# Patient Record
Sex: Female | Born: 1980 | Race: Black or African American | Hispanic: No | Marital: Single | Smoking: Current every day smoker
Health system: Southern US, Community
[De-identification: ages and names within clinical notes are randomized; demographics above are authoritative.]

## PROBLEM LIST (undated history)

## (undated) ENCOUNTER — Inpatient Hospital Stay (HOSPITAL_COMMUNITY): Payer: Self-pay

## (undated) DIAGNOSIS — D649 Anemia, unspecified: Secondary | ICD-10-CM

## (undated) DIAGNOSIS — U071 COVID-19: Secondary | ICD-10-CM

## (undated) DIAGNOSIS — A749 Chlamydial infection, unspecified: Secondary | ICD-10-CM

## (undated) DIAGNOSIS — G43909 Migraine, unspecified, not intractable, without status migrainosus: Secondary | ICD-10-CM

## (undated) DIAGNOSIS — I10 Essential (primary) hypertension: Secondary | ICD-10-CM

## (undated) DIAGNOSIS — F99 Mental disorder, not otherwise specified: Secondary | ICD-10-CM

## (undated) DIAGNOSIS — D219 Benign neoplasm of connective and other soft tissue, unspecified: Secondary | ICD-10-CM

## (undated) DIAGNOSIS — O139 Gestational [pregnancy-induced] hypertension without significant proteinuria, unspecified trimester: Secondary | ICD-10-CM

## (undated) DIAGNOSIS — A599 Trichomoniasis, unspecified: Secondary | ICD-10-CM

## (undated) DIAGNOSIS — A549 Gonococcal infection, unspecified: Secondary | ICD-10-CM

## (undated) DIAGNOSIS — O009 Unspecified ectopic pregnancy without intrauterine pregnancy: Secondary | ICD-10-CM

## (undated) HISTORY — PX: DILATION AND CURETTAGE OF UTERUS: SHX78

## (undated) HISTORY — PX: ECTOPIC PREGNANCY SURGERY: SHX613

---

## 2000-07-29 ENCOUNTER — Emergency Department (HOSPITAL_COMMUNITY): Admission: EM | Admit: 2000-07-29 | Discharge: 2000-07-29 | Payer: Self-pay | Admitting: Emergency Medicine

## 2001-04-19 ENCOUNTER — Emergency Department (HOSPITAL_COMMUNITY): Admission: EM | Admit: 2001-04-19 | Discharge: 2001-04-19 | Payer: Self-pay | Admitting: Emergency Medicine

## 2001-04-19 ENCOUNTER — Encounter: Payer: Self-pay | Admitting: Emergency Medicine

## 2002-12-21 ENCOUNTER — Inpatient Hospital Stay (HOSPITAL_COMMUNITY): Admission: AD | Admit: 2002-12-21 | Discharge: 2002-12-21 | Payer: Self-pay | Admitting: *Deleted

## 2003-12-08 ENCOUNTER — Inpatient Hospital Stay (HOSPITAL_COMMUNITY): Admission: AD | Admit: 2003-12-08 | Discharge: 2003-12-08 | Payer: Self-pay | Admitting: *Deleted

## 2004-04-08 ENCOUNTER — Inpatient Hospital Stay (HOSPITAL_COMMUNITY): Admission: AD | Admit: 2004-04-08 | Discharge: 2004-04-27 | Payer: Self-pay | Admitting: Obstetrics

## 2004-05-18 ENCOUNTER — Encounter: Admission: RE | Admit: 2004-05-18 | Discharge: 2004-06-17 | Payer: Self-pay | Admitting: Obstetrics

## 2004-07-18 ENCOUNTER — Encounter: Admission: RE | Admit: 2004-07-18 | Discharge: 2004-08-17 | Payer: Self-pay | Admitting: Obstetrics

## 2004-09-17 ENCOUNTER — Encounter: Admission: RE | Admit: 2004-09-17 | Discharge: 2004-10-17 | Payer: Self-pay | Admitting: Obstetrics

## 2004-10-18 ENCOUNTER — Encounter: Admission: RE | Admit: 2004-10-18 | Discharge: 2004-11-17 | Payer: Self-pay | Admitting: Obstetrics

## 2005-01-17 ENCOUNTER — Emergency Department (HOSPITAL_COMMUNITY): Admission: EM | Admit: 2005-01-17 | Discharge: 2005-01-17 | Payer: Self-pay | Admitting: Emergency Medicine

## 2006-06-02 ENCOUNTER — Emergency Department (HOSPITAL_COMMUNITY): Admission: EM | Admit: 2006-06-02 | Discharge: 2006-06-02 | Payer: Self-pay | Admitting: Family Medicine

## 2007-01-15 ENCOUNTER — Emergency Department (HOSPITAL_COMMUNITY): Admission: EM | Admit: 2007-01-15 | Discharge: 2007-01-15 | Payer: Self-pay | Admitting: Emergency Medicine

## 2007-04-14 ENCOUNTER — Emergency Department (HOSPITAL_COMMUNITY): Admission: EM | Admit: 2007-04-14 | Discharge: 2007-04-14 | Payer: Self-pay | Admitting: Family Medicine

## 2007-08-26 ENCOUNTER — Emergency Department (HOSPITAL_COMMUNITY): Admission: EM | Admit: 2007-08-26 | Discharge: 2007-08-26 | Payer: Self-pay | Admitting: Emergency Medicine

## 2007-09-18 ENCOUNTER — Inpatient Hospital Stay (HOSPITAL_COMMUNITY): Admission: AD | Admit: 2007-09-18 | Discharge: 2007-09-18 | Payer: Self-pay | Admitting: Obstetrics & Gynecology

## 2008-04-18 ENCOUNTER — Emergency Department (HOSPITAL_COMMUNITY): Admission: EM | Admit: 2008-04-18 | Discharge: 2008-04-19 | Payer: Self-pay | Admitting: Emergency Medicine

## 2008-07-16 ENCOUNTER — Ambulatory Visit (HOSPITAL_COMMUNITY): Admission: AD | Admit: 2008-07-16 | Discharge: 2008-07-16 | Payer: Self-pay | Admitting: Obstetrics & Gynecology

## 2008-07-16 ENCOUNTER — Ambulatory Visit: Payer: Self-pay | Admitting: Family Medicine

## 2008-07-16 ENCOUNTER — Encounter: Payer: Self-pay | Admitting: Family Medicine

## 2008-07-17 ENCOUNTER — Inpatient Hospital Stay (HOSPITAL_COMMUNITY): Admission: AD | Admit: 2008-07-17 | Discharge: 2008-07-19 | Payer: Self-pay | Admitting: Obstetrics & Gynecology

## 2008-07-17 ENCOUNTER — Ambulatory Visit: Payer: Self-pay | Admitting: Obstetrics and Gynecology

## 2008-07-30 ENCOUNTER — Ambulatory Visit: Payer: Self-pay | Admitting: Obstetrics and Gynecology

## 2008-12-25 ENCOUNTER — Emergency Department (HOSPITAL_COMMUNITY): Admission: EM | Admit: 2008-12-25 | Discharge: 2008-12-25 | Payer: Self-pay | Admitting: Emergency Medicine

## 2008-12-26 ENCOUNTER — Encounter: Admission: RE | Admit: 2008-12-26 | Discharge: 2008-12-26 | Payer: Self-pay | Admitting: Chiropractic Medicine

## 2009-03-25 ENCOUNTER — Ambulatory Visit: Payer: Self-pay | Admitting: Obstetrics and Gynecology

## 2009-03-25 ENCOUNTER — Inpatient Hospital Stay (HOSPITAL_COMMUNITY): Admission: AD | Admit: 2009-03-25 | Discharge: 2009-03-25 | Payer: Self-pay | Admitting: Obstetrics & Gynecology

## 2009-03-28 ENCOUNTER — Inpatient Hospital Stay (HOSPITAL_COMMUNITY): Admission: AD | Admit: 2009-03-28 | Discharge: 2009-03-28 | Payer: Self-pay | Admitting: Obstetrics and Gynecology

## 2009-04-01 ENCOUNTER — Inpatient Hospital Stay (HOSPITAL_COMMUNITY): Admission: AD | Admit: 2009-04-01 | Discharge: 2009-04-01 | Payer: Self-pay | Admitting: Obstetrics & Gynecology

## 2009-04-03 ENCOUNTER — Inpatient Hospital Stay (HOSPITAL_COMMUNITY): Admission: AD | Admit: 2009-04-03 | Discharge: 2009-04-03 | Payer: Self-pay | Admitting: Obstetrics & Gynecology

## 2009-04-09 ENCOUNTER — Inpatient Hospital Stay (HOSPITAL_COMMUNITY): Admission: RE | Admit: 2009-04-09 | Discharge: 2009-04-09 | Payer: Self-pay | Admitting: Obstetrics & Gynecology

## 2009-05-21 ENCOUNTER — Inpatient Hospital Stay (HOSPITAL_COMMUNITY): Admission: AD | Admit: 2009-05-21 | Discharge: 2009-05-22 | Payer: Self-pay | Admitting: Family Medicine

## 2009-06-11 ENCOUNTER — Ambulatory Visit: Payer: Self-pay | Admitting: Obstetrics & Gynecology

## 2009-06-15 ENCOUNTER — Ambulatory Visit: Payer: Self-pay | Admitting: Obstetrics & Gynecology

## 2009-06-15 LAB — CONVERTED CEMR LAB
Albumin: 3.5 g/dL (ref 3.5–5.2)
CO2: 20 meq/L (ref 19–32)
Calcium: 8.7 mg/dL (ref 8.4–10.5)
Chloride: 107 meq/L (ref 96–112)
Creatinine Clearance: 119 mL/min — ABNORMAL HIGH (ref 75–115)
Glucose, Bld: 75 mg/dL (ref 70–99)
Potassium: 3.7 meq/L (ref 3.5–5.3)
RBC: 3.35 M/uL — ABNORMAL LOW (ref 3.87–5.11)
Sodium: 138 meq/L (ref 135–145)
TSH: 1.015 microintl units/mL (ref 0.350–4.500)
Total Protein: 6 g/dL (ref 6.0–8.3)
Uric Acid, Serum: 3.9 mg/dL (ref 2.4–7.0)
WBC: 7.1 10*3/uL (ref 4.0–10.5)

## 2009-06-18 ENCOUNTER — Ambulatory Visit (HOSPITAL_COMMUNITY): Admission: RE | Admit: 2009-06-18 | Discharge: 2009-06-18 | Payer: Self-pay | Admitting: Obstetrics & Gynecology

## 2009-06-18 ENCOUNTER — Ambulatory Visit: Payer: Self-pay | Admitting: Obstetrics & Gynecology

## 2009-06-19 ENCOUNTER — Ambulatory Visit (HOSPITAL_COMMUNITY): Admission: RE | Admit: 2009-06-19 | Discharge: 2009-06-19 | Payer: Self-pay | Admitting: Obstetrics & Gynecology

## 2009-06-19 ENCOUNTER — Ambulatory Visit: Payer: Self-pay | Admitting: Obstetrics & Gynecology

## 2009-07-02 ENCOUNTER — Ambulatory Visit: Payer: Self-pay | Admitting: Obstetrics & Gynecology

## 2009-07-09 ENCOUNTER — Ambulatory Visit: Payer: Self-pay | Admitting: Obstetrics & Gynecology

## 2009-07-13 ENCOUNTER — Encounter: Payer: Self-pay | Admitting: Family Medicine

## 2009-07-13 ENCOUNTER — Inpatient Hospital Stay (HOSPITAL_COMMUNITY): Admission: AD | Admit: 2009-07-13 | Discharge: 2009-07-13 | Payer: Self-pay | Admitting: Obstetrics & Gynecology

## 2009-07-16 ENCOUNTER — Ambulatory Visit (HOSPITAL_COMMUNITY): Admission: RE | Admit: 2009-07-16 | Discharge: 2009-07-16 | Payer: Self-pay | Admitting: Obstetrics & Gynecology

## 2009-07-16 ENCOUNTER — Ambulatory Visit: Payer: Self-pay | Admitting: Obstetrics & Gynecology

## 2009-07-23 ENCOUNTER — Ambulatory Visit: Payer: Self-pay | Admitting: Obstetrics & Gynecology

## 2009-07-23 ENCOUNTER — Encounter: Payer: Self-pay | Admitting: Obstetrics & Gynecology

## 2009-07-30 ENCOUNTER — Ambulatory Visit: Payer: Self-pay | Admitting: Obstetrics & Gynecology

## 2009-08-02 ENCOUNTER — Inpatient Hospital Stay (HOSPITAL_COMMUNITY): Admission: AD | Admit: 2009-08-02 | Discharge: 2009-08-02 | Payer: Self-pay | Admitting: Obstetrics and Gynecology

## 2009-08-04 ENCOUNTER — Ambulatory Visit: Payer: Self-pay | Admitting: Obstetrics and Gynecology

## 2009-08-13 ENCOUNTER — Ambulatory Visit: Payer: Self-pay | Admitting: Obstetrics & Gynecology

## 2009-08-13 ENCOUNTER — Encounter: Payer: Self-pay | Admitting: Obstetrics & Gynecology

## 2009-08-20 ENCOUNTER — Ambulatory Visit: Payer: Self-pay | Admitting: Obstetrics & Gynecology

## 2009-08-20 ENCOUNTER — Ambulatory Visit (HOSPITAL_COMMUNITY): Admission: RE | Admit: 2009-08-20 | Discharge: 2009-08-20 | Payer: Self-pay | Admitting: Family Medicine

## 2009-08-31 ENCOUNTER — Ambulatory Visit: Payer: Self-pay | Admitting: Obstetrics & Gynecology

## 2009-09-17 ENCOUNTER — Encounter: Payer: Self-pay | Admitting: Obstetrics & Gynecology

## 2009-09-17 ENCOUNTER — Ambulatory Visit: Payer: Self-pay | Admitting: Obstetrics & Gynecology

## 2009-09-17 LAB — CONVERTED CEMR LAB
Albumin: 3.2 g/dL — ABNORMAL LOW (ref 3.5–5.2)
Alkaline Phosphatase: 73 units/L (ref 39–117)
BUN: 6 mg/dL (ref 6–23)
Calcium: 8.2 mg/dL — ABNORMAL LOW (ref 8.4–10.5)
Glucose, Bld: 137 mg/dL — ABNORMAL HIGH (ref 70–99)
Hemoglobin: 11.5 g/dL — ABNORMAL LOW (ref 12.0–15.0)
MCHC: 34.2 g/dL (ref 30.0–36.0)
Potassium: 3.4 meq/L — ABNORMAL LOW (ref 3.5–5.3)
RBC: 3.57 M/uL — ABNORMAL LOW (ref 3.87–5.11)

## 2009-09-24 ENCOUNTER — Ambulatory Visit: Payer: Self-pay | Admitting: Obstetrics & Gynecology

## 2009-09-24 ENCOUNTER — Encounter (INDEPENDENT_AMBULATORY_CARE_PROVIDER_SITE_OTHER): Payer: Self-pay | Admitting: *Deleted

## 2009-10-01 ENCOUNTER — Encounter: Payer: Self-pay | Admitting: Family Medicine

## 2009-10-01 ENCOUNTER — Ambulatory Visit: Payer: Self-pay | Admitting: Obstetrics & Gynecology

## 2009-10-01 ENCOUNTER — Inpatient Hospital Stay (HOSPITAL_COMMUNITY): Admission: AD | Admit: 2009-10-01 | Discharge: 2009-10-01 | Payer: Self-pay | Admitting: Obstetrics & Gynecology

## 2009-10-08 ENCOUNTER — Ambulatory Visit: Payer: Self-pay | Admitting: Obstetrics & Gynecology

## 2009-10-15 ENCOUNTER — Ambulatory Visit: Payer: Self-pay | Admitting: Family Medicine

## 2009-10-15 LAB — CONVERTED CEMR LAB
ALT: 9 units/L (ref 0–35)
AST: 12 units/L (ref 0–37)
Albumin: 3.1 g/dL — ABNORMAL LOW (ref 3.5–5.2)
Calcium: 8.5 mg/dL (ref 8.4–10.5)
Chloride: 107 meq/L (ref 96–112)
Hemoglobin: 11.8 g/dL — ABNORMAL LOW (ref 12.0–15.0)
Platelets: 196 10*3/uL (ref 150–400)
Potassium: 3.6 meq/L (ref 3.5–5.3)
RDW: 12.5 % (ref 11.5–15.5)
Total Protein: 5.7 g/dL — ABNORMAL LOW (ref 6.0–8.3)

## 2009-10-16 ENCOUNTER — Ambulatory Visit: Payer: Self-pay | Admitting: Obstetrics & Gynecology

## 2009-10-19 ENCOUNTER — Ambulatory Visit: Payer: Self-pay | Admitting: Obstetrics & Gynecology

## 2009-10-22 ENCOUNTER — Ambulatory Visit (HOSPITAL_COMMUNITY): Admission: RE | Admit: 2009-10-22 | Discharge: 2009-10-22 | Payer: Self-pay | Admitting: Family Medicine

## 2009-10-22 ENCOUNTER — Ambulatory Visit: Payer: Self-pay | Admitting: Family Medicine

## 2009-10-22 LAB — CONVERTED CEMR LAB
CO2: 22 meq/L (ref 19–32)
Calcium: 8.4 mg/dL (ref 8.4–10.5)
Creatinine, Ser: 0.64 mg/dL (ref 0.40–1.20)
Glucose, Bld: 91 mg/dL (ref 70–99)
HCT: 33.9 % — ABNORMAL LOW (ref 36.0–46.0)
MCV: 92.9 fL (ref 78.0–100.0)
RBC: 3.65 M/uL — ABNORMAL LOW (ref 3.87–5.11)
Total Bilirubin: 0.2 mg/dL — ABNORMAL LOW (ref 0.3–1.2)
Total Protein: 5.5 g/dL — ABNORMAL LOW (ref 6.0–8.3)
WBC: 7.7 10*3/uL (ref 4.0–10.5)

## 2009-10-23 ENCOUNTER — Ambulatory Visit: Payer: Self-pay | Admitting: Obstetrics and Gynecology

## 2009-10-23 ENCOUNTER — Encounter: Payer: Self-pay | Admitting: Family Medicine

## 2009-10-23 LAB — CONVERTED CEMR LAB
Collection Interval-CRCL: 24 hr
Creatinine Clearance: 86 mL/min (ref 75–115)
Creatinine, Urine: 65.7 mg/dL
Protein, Ur: 48 mg/24hr — ABNORMAL LOW (ref 50–100)

## 2009-10-26 ENCOUNTER — Ambulatory Visit: Payer: Self-pay | Admitting: Obstetrics & Gynecology

## 2009-10-29 ENCOUNTER — Inpatient Hospital Stay (HOSPITAL_COMMUNITY): Admission: RE | Admit: 2009-10-29 | Discharge: 2009-11-02 | Payer: Self-pay | Admitting: Obstetrics & Gynecology

## 2009-10-29 ENCOUNTER — Ambulatory Visit: Payer: Self-pay | Admitting: Family Medicine

## 2009-10-31 ENCOUNTER — Encounter: Payer: Self-pay | Admitting: Obstetrics & Gynecology

## 2009-11-04 ENCOUNTER — Emergency Department (HOSPITAL_COMMUNITY): Admission: EM | Admit: 2009-11-04 | Discharge: 2009-11-04 | Payer: Self-pay | Admitting: Emergency Medicine

## 2010-09-28 ENCOUNTER — Emergency Department (HOSPITAL_COMMUNITY)
Admission: EM | Admit: 2010-09-28 | Discharge: 2010-09-28 | Payer: Self-pay | Source: Home / Self Care | Admitting: Emergency Medicine

## 2010-09-29 LAB — DIFFERENTIAL
Basophils Absolute: 0 10*3/uL (ref 0.0–0.1)
Basophils Relative: 0 % (ref 0–1)
Eosinophils Absolute: 0 10*3/uL (ref 0.0–0.7)
Eosinophils Relative: 0 % (ref 0–5)
Lymphocytes Relative: 9 % — ABNORMAL LOW (ref 12–46)
Lymphs Abs: 1.4 10*3/uL (ref 0.7–4.0)
Monocytes Absolute: 0.5 10*3/uL (ref 0.1–1.0)
Monocytes Relative: 4 % (ref 3–12)
Neutro Abs: 13.4 10*3/uL — ABNORMAL HIGH (ref 1.7–7.7)
Neutrophils Relative %: 87 % — ABNORMAL HIGH (ref 43–77)

## 2010-09-29 LAB — URINALYSIS, ROUTINE W REFLEX MICROSCOPIC
Hgb urine dipstick: NEGATIVE
Ketones, ur: >80 mg/dL — AB
Nitrite: NEGATIVE
Protein, ur: NEGATIVE mg/dL
Specific Gravity, Urine: 1.025 (ref 1.005–1.030)
Urine Glucose, Fasting: NEGATIVE mg/dL
Urobilinogen, UA: 1 mg/dL (ref 0.0–1.0)
pH: 5.5 (ref 5.0–8.0)

## 2010-09-29 LAB — CBC
HCT: 38.6 % (ref 36.0–46.0)
Hemoglobin: 13 g/dL (ref 12.0–15.0)
MCH: 31.7 pg (ref 26.0–34.0)
MCHC: 33.7 g/dL (ref 30.0–36.0)
MCV: 94.1 fL (ref 78.0–100.0)
Platelets: 202 10*3/uL (ref 150–400)
RBC: 4.1 MIL/uL (ref 3.87–5.11)
RDW: 12.4 % (ref 11.5–15.5)
WBC: 15.3 10*3/uL — ABNORMAL HIGH (ref 4.0–10.5)

## 2010-09-29 LAB — URINE MICROSCOPIC-ADD ON

## 2010-09-29 LAB — POCT I-STAT, CHEM 8
BUN: 5 mg/dL — ABNORMAL LOW (ref 6–23)
Calcium, Ion: 1.12 mmol/L (ref 1.12–1.32)
Chloride: 102 mEq/L (ref 96–112)
Creatinine, Ser: 1 mg/dL (ref 0.4–1.2)
Glucose, Bld: 104 mg/dL — ABNORMAL HIGH (ref 70–99)
HCT: 40 % (ref 36.0–46.0)
Hemoglobin: 13.6 g/dL (ref 12.0–15.0)
Potassium: 3.2 mEq/L — ABNORMAL LOW (ref 3.5–5.1)
Sodium: 139 mEq/L (ref 135–145)
TCO2: 28 mmol/L (ref 0–100)

## 2010-09-29 LAB — POCT PREGNANCY, URINE: Preg Test, Ur: NEGATIVE

## 2010-09-29 LAB — WET PREP, GENITAL
Trich, Wet Prep: NONE SEEN
Yeast Wet Prep HPF POC: NONE SEEN

## 2010-09-29 LAB — RPR: RPR Ser Ql: NONREACTIVE

## 2010-10-04 LAB — URINE CULTURE: Colony Count: >=100000

## 2010-11-01 IMAGING — US US OB TRANSVAGINAL MODIFY
1 series · 14 of 28 positions shown · non-contrast
Comparison: None.

CLINICAL DATA: Pain.

OBSTETRIC <14 WK US AND TRANSVAGINAL OB US
TECHNIQUE: Both transabdominal and transvaginal ultrasound
examinations were performed for complete evaluation of the
gestation as well as the maternal uterus, adnexal regions, and
pelvic cul-de-sac.

[Series 1: us ob comp less 14 wks · 0.21mm/px · 14 of 49 slices shown]
[im 2/49]
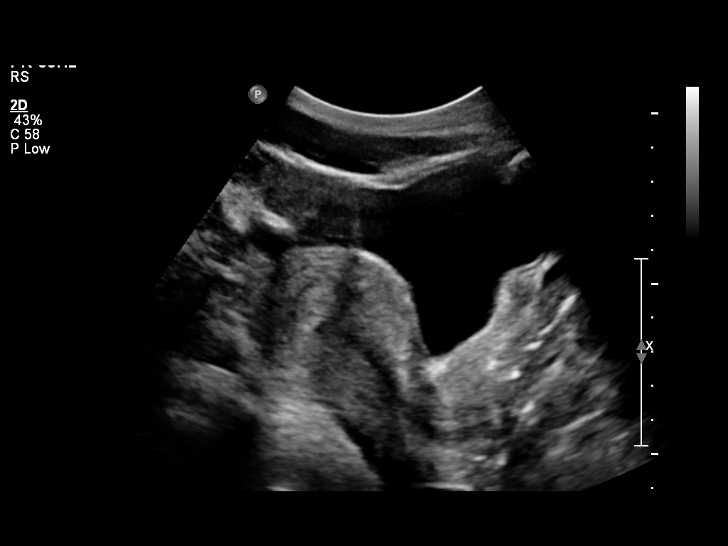
[im 6/49]
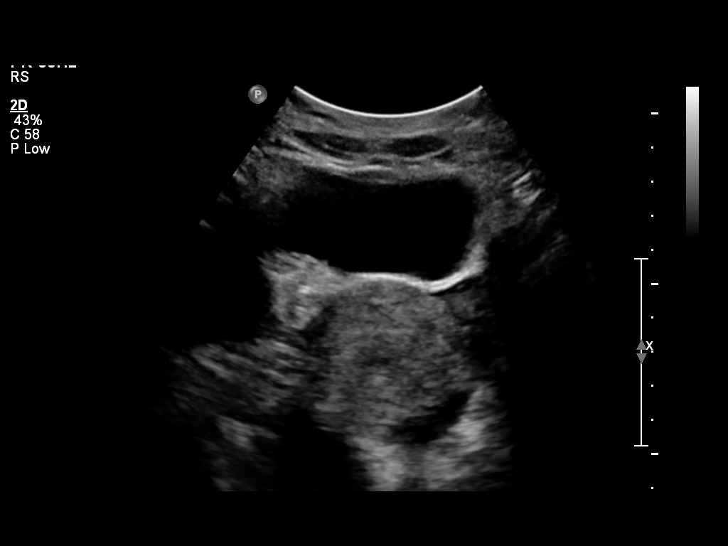
[im 9/49]
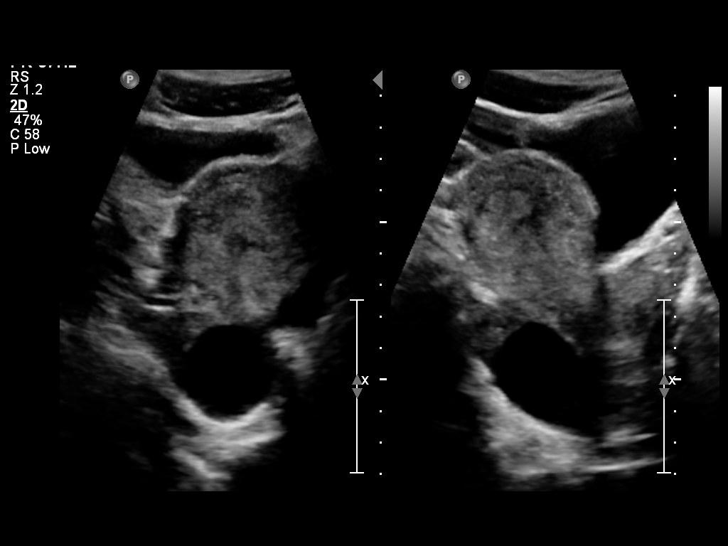
[im 13/49]
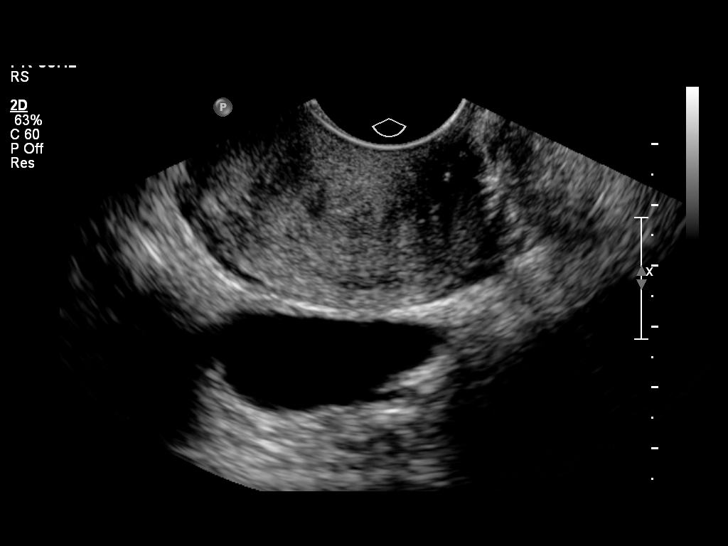
[im 17/49]
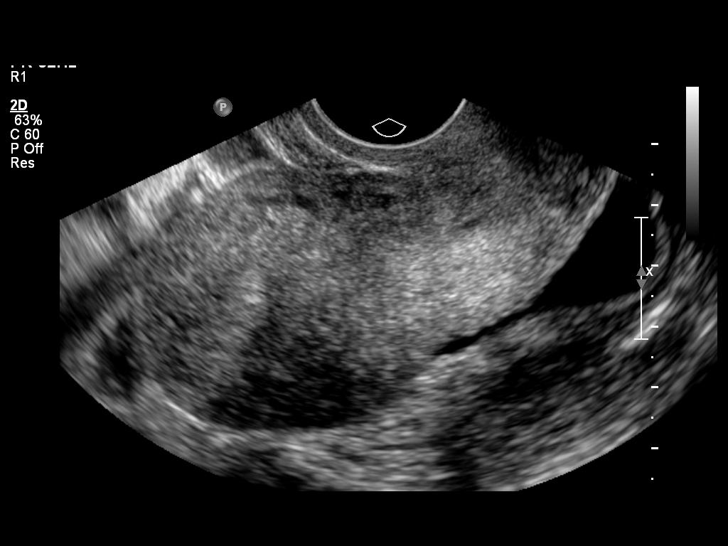
[im 20/49]
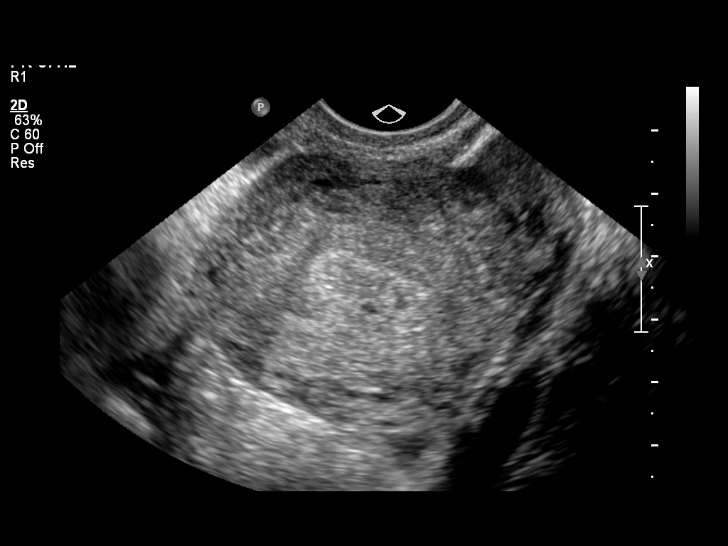
[im 24/49]
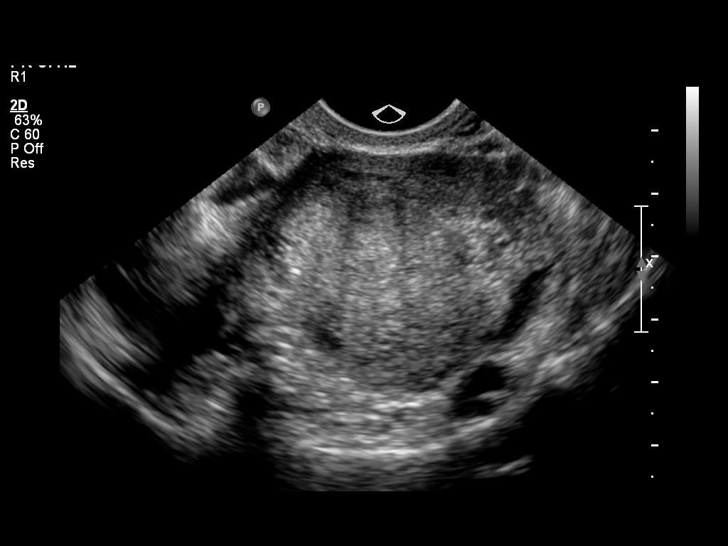
[im 27/49]
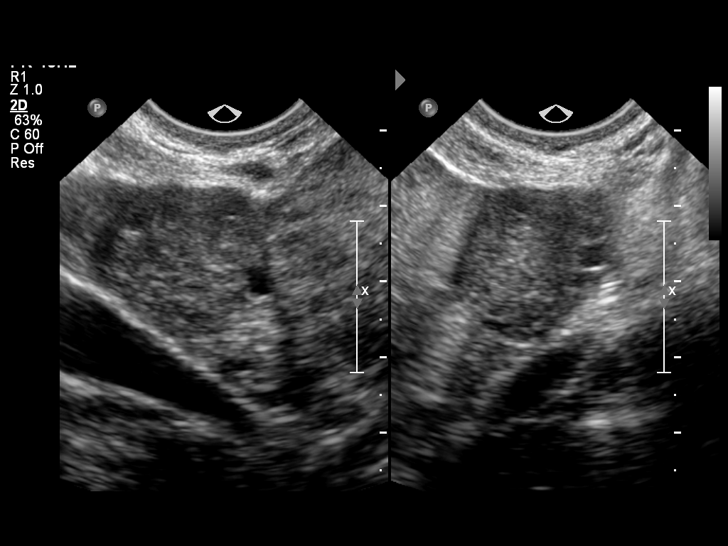
[im 31/49]
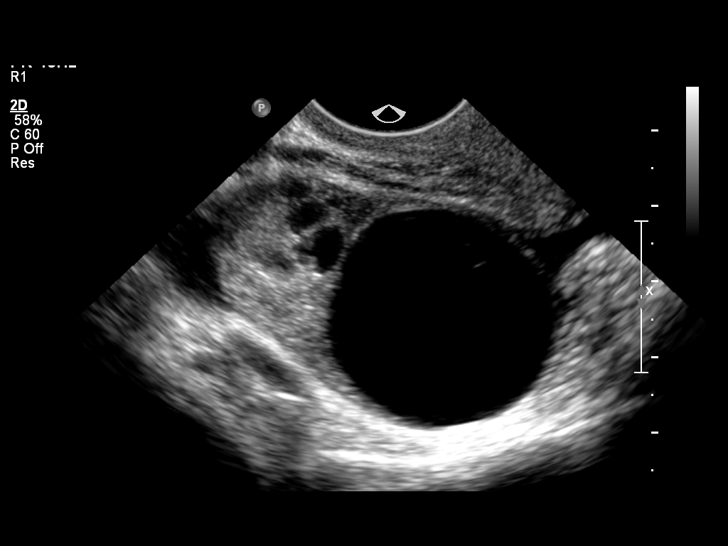
[im 34/49]
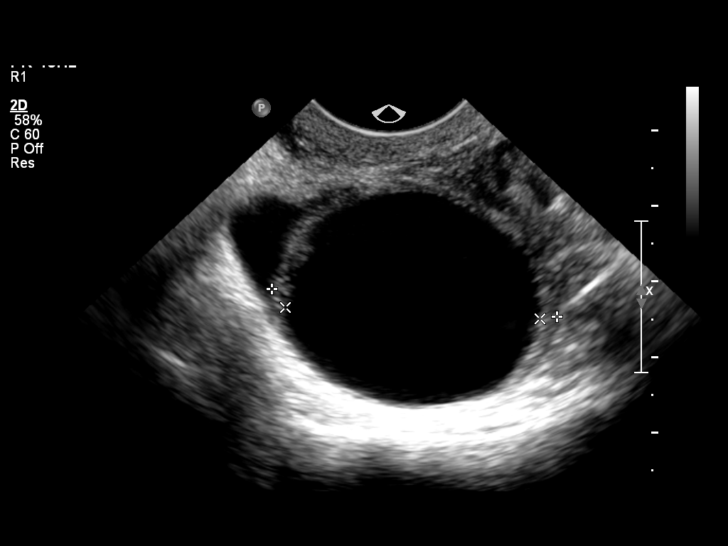
[im 38/49]
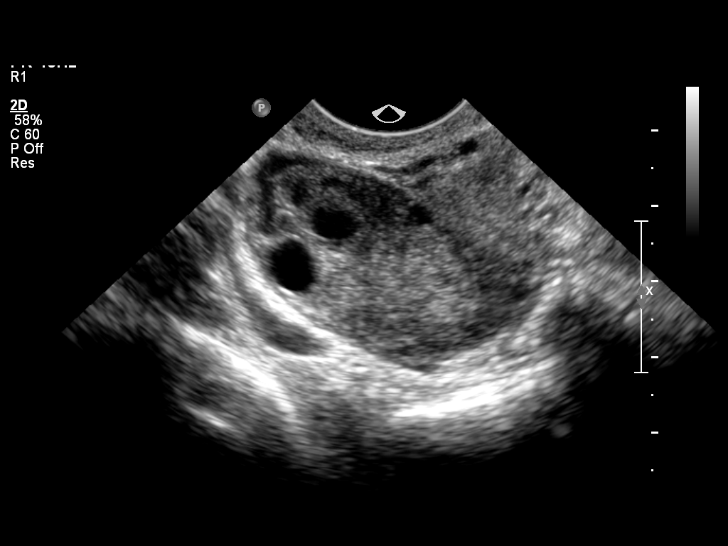
[im 41/49]
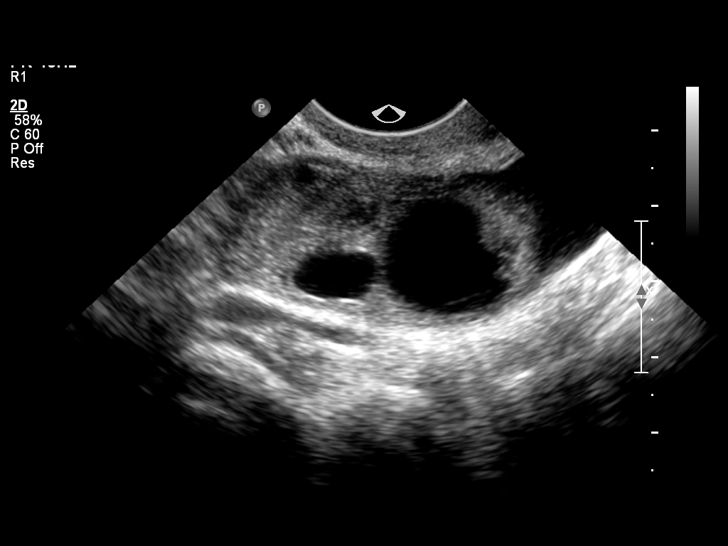
[im 45/49]
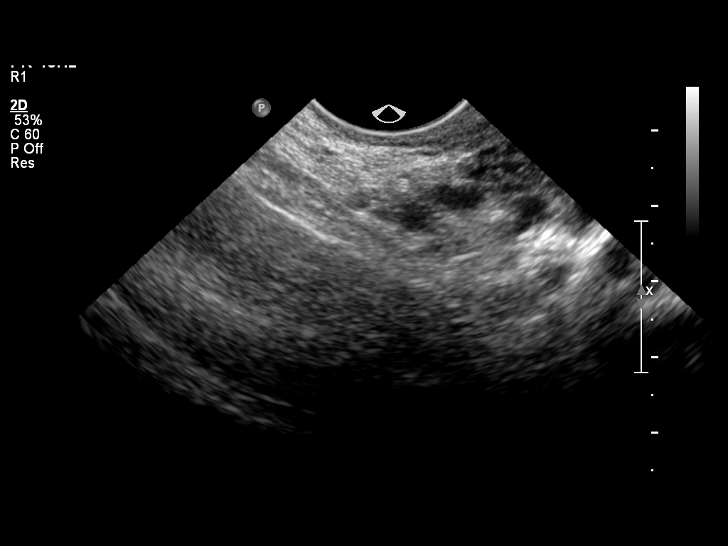
[im 49/49]
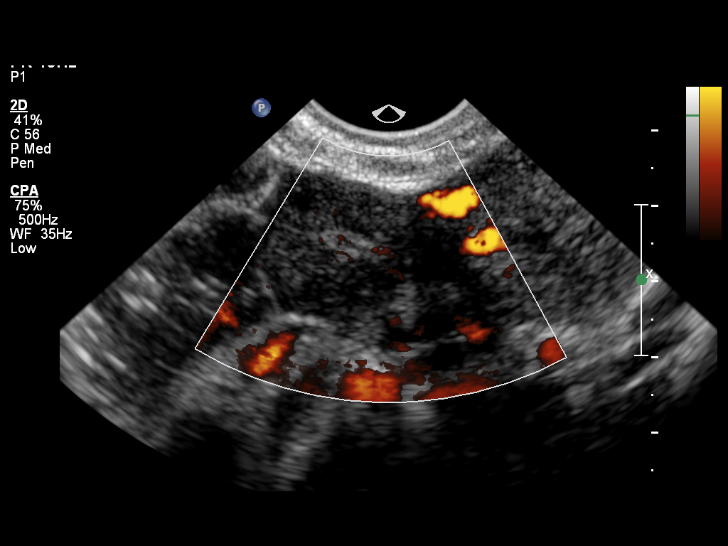

[14 of 28 positions shown; findings below may reference images not displayed]

Within the endometrial canal, there is a cystic structure which may
represent a very early intrauterine gestational sac with a mean sac
diameter of 2.2 mm (which would be consistent with a 4-week-0-day
gestational age).  However, it is difficult to confirm this with
any certainty and therefore correlation with serial beta HCGs and
close follow-up sonogram recommended to help exclude the
possibility of this not representing an early intrauterine
pregnancy and representing result underlying ectopic pregnancy.

Right ovary with dominant 3.8 cm simple appearing cyst.

Small amount of free fluid in the cul-de-sac.
IMPRESSION: Question very early intrauterine gestation as discussed above.
Ectopic pregnancy cannot be completely excluded.  This will require
close clinical correlation, laboratory correlation and follow-up
sonogram.

This is a call report.

## 2010-11-16 IMAGING — US US OB TRANSVAGINAL
1 series · 14 of 28 positions shown · non-contrast
Comparison: none

OBSTETRICAL ULTRASOUND:
 This ultrasound exam was performed in the [HOSPITAL] Ultrasound Department.  The OB US report was generated in the AS system, and faxed to the ordering physician.  This report is also available in [REDACTED] PACS.

[Series 1: us ob transvaginal · 0.06mm/px · 14 of 51 slices shown]
[im 2/51]
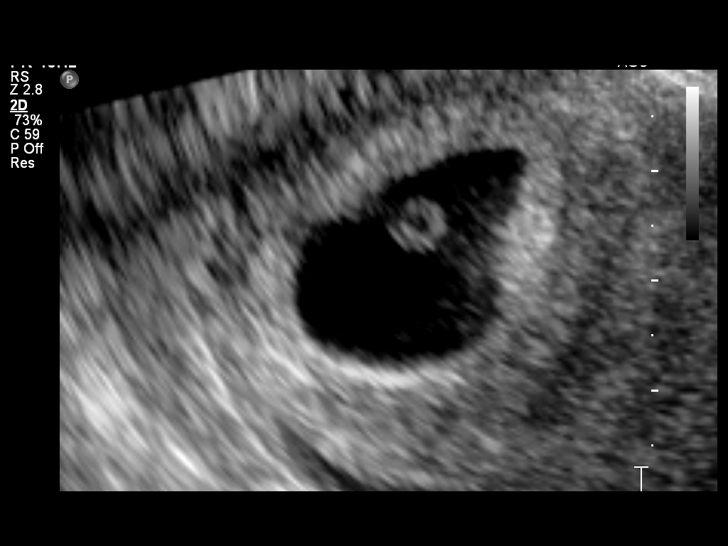
[im 6/51]
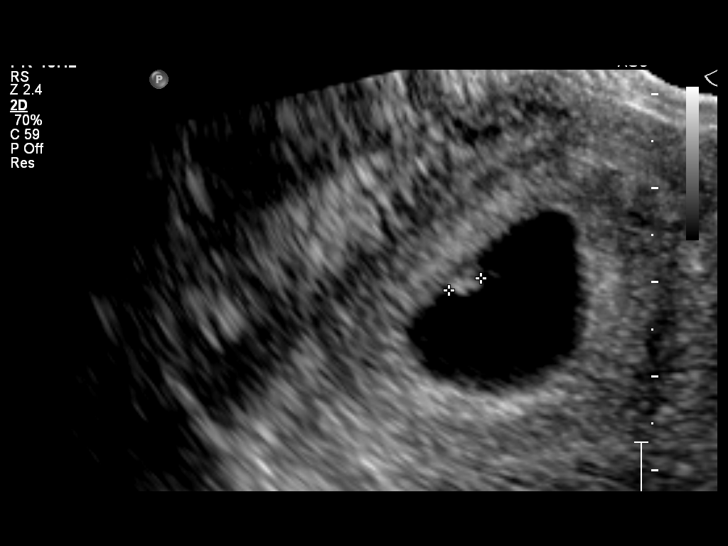
[im 10/51]
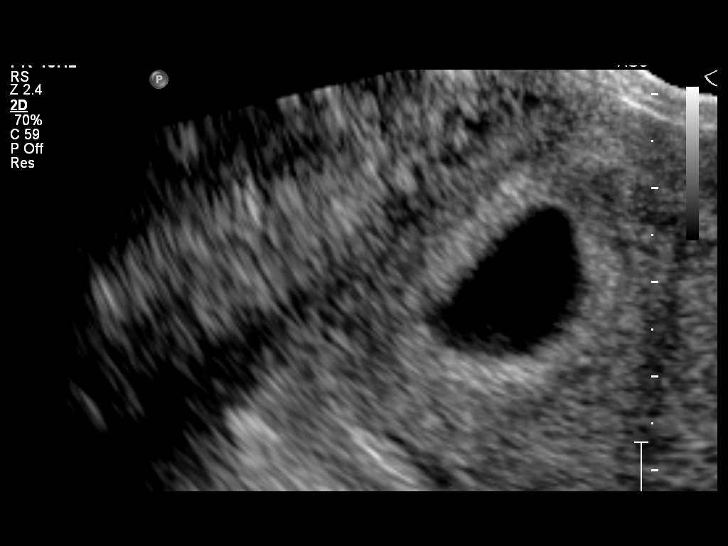
[im 13/51]
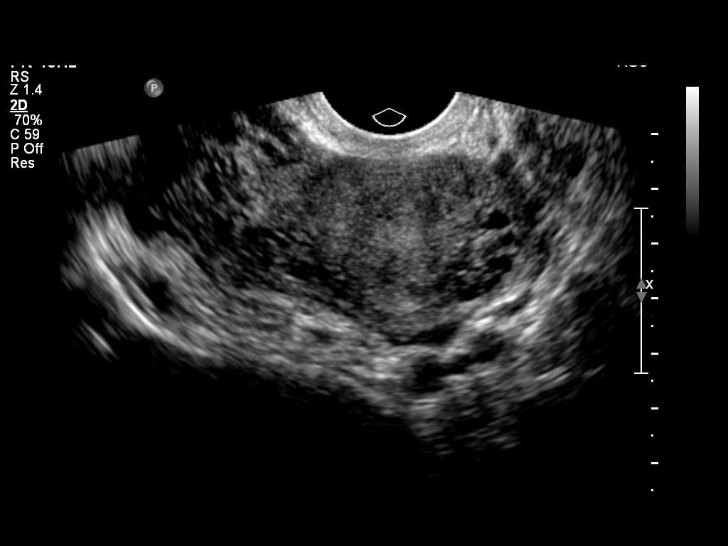
[im 17/51]
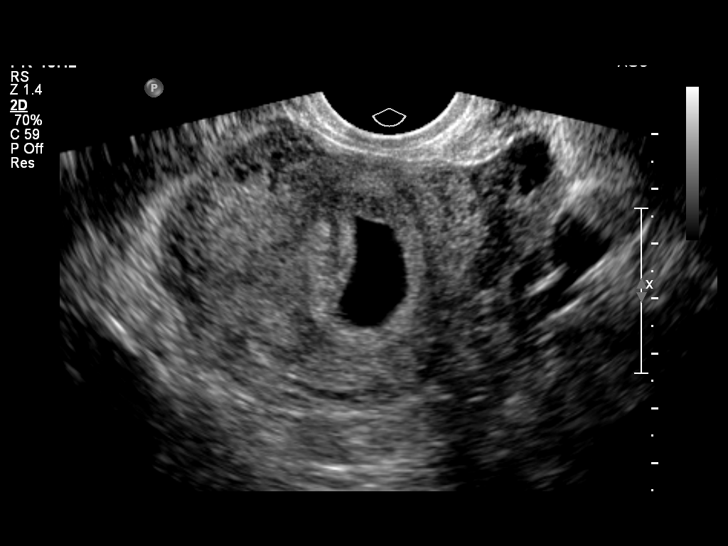
[im 21/51]
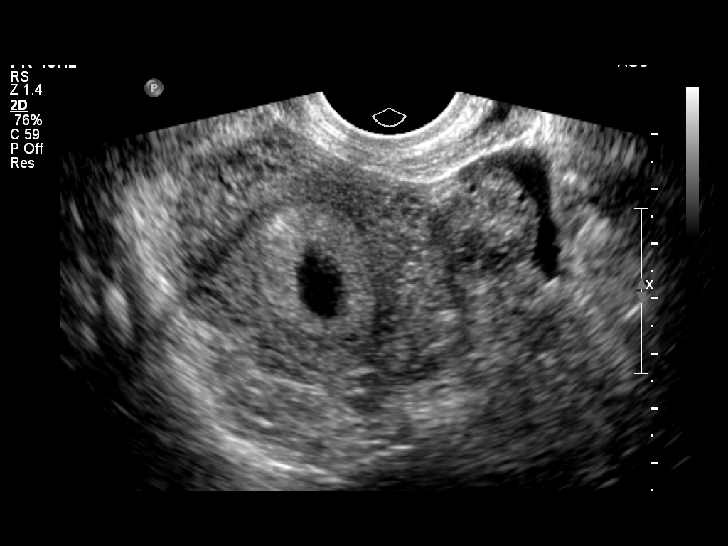
[im 25/51]
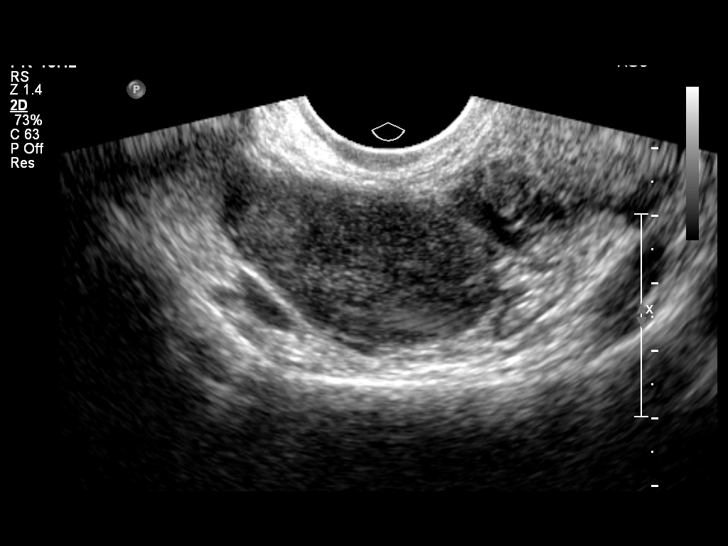
[im 28/51]
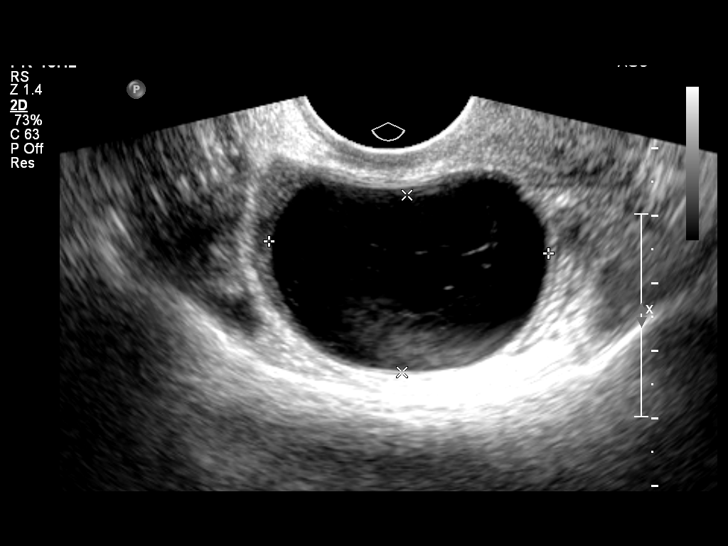
[im 32/51]
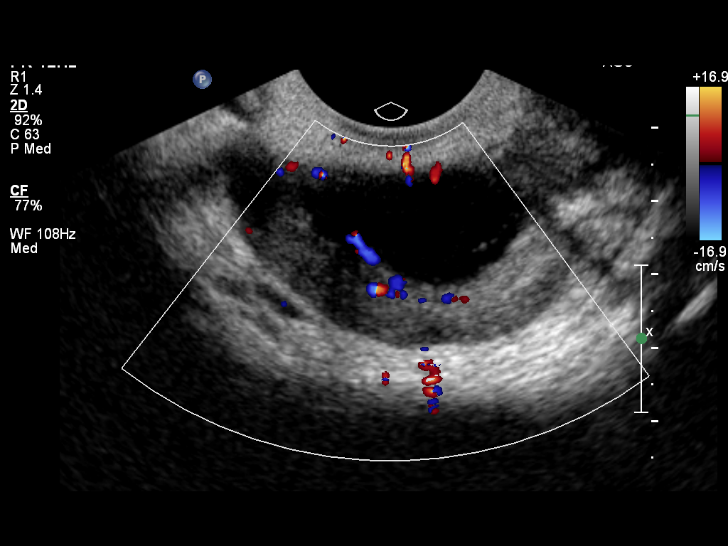
[im 36/51]
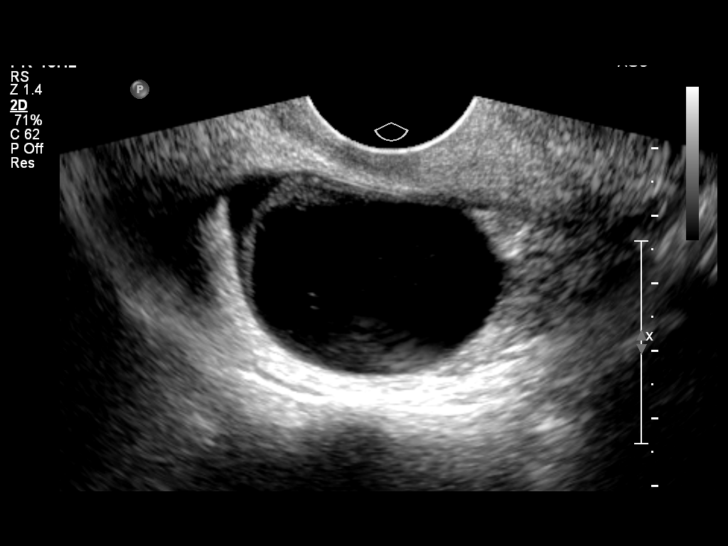
[im 39/51]
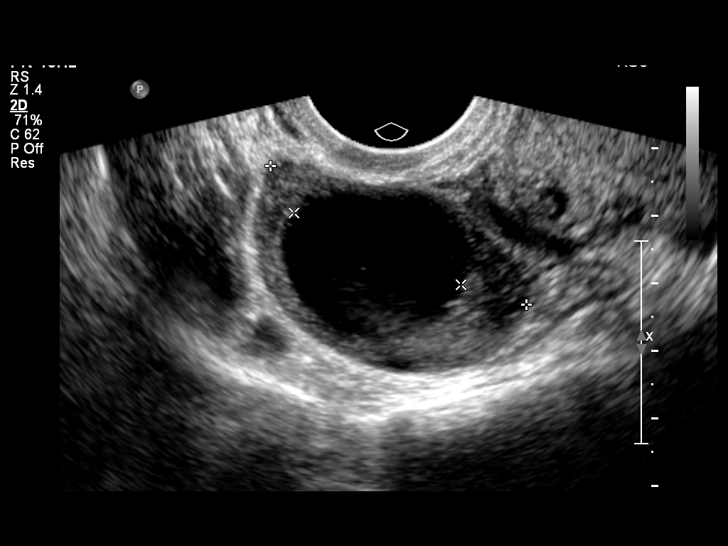
[im 43/51]
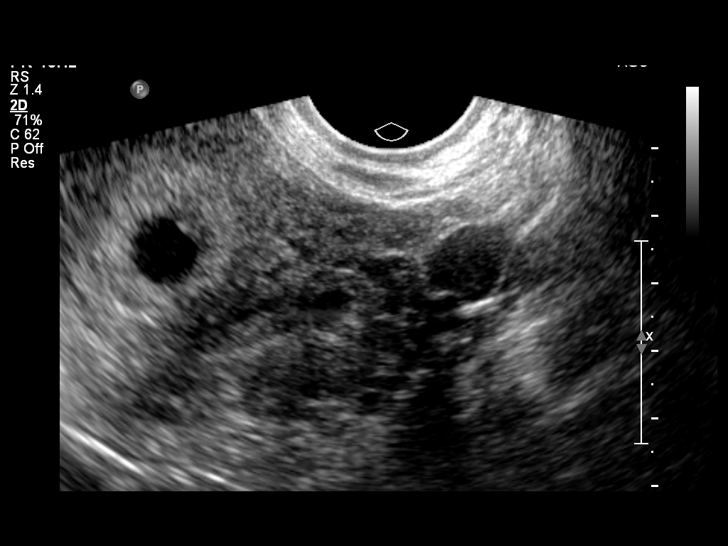
[im 47/51]
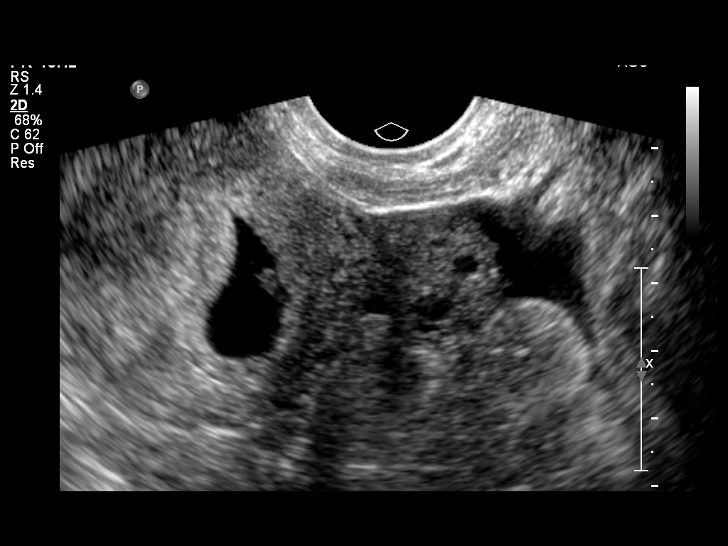
[im 51/51]
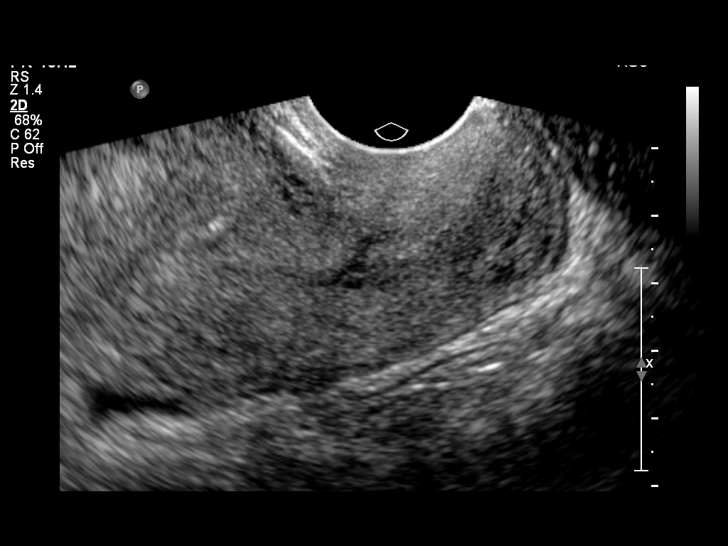

[14 of 28 positions shown; findings below may reference images not displayed]

IMPRESSION: See AS Obstetric US report.

## 2010-11-28 LAB — COMPREHENSIVE METABOLIC PANEL
AST: 16 U/L (ref 0–37)
Albumin: 2.9 g/dL — ABNORMAL LOW (ref 3.5–5.2)
BUN: 5 mg/dL — ABNORMAL LOW (ref 6–23)
CO2: 23 mEq/L (ref 19–32)
Calcium: 8.9 mg/dL (ref 8.4–10.5)
Creatinine, Ser: 0.66 mg/dL (ref 0.4–1.2)
GFR calc Af Amer: >60 mL/min (ref >60)
GFR calc non Af Amer: >60 mL/min (ref >60)
Total Bilirubin: 0.6 mg/dL (ref 0.3–1.2)

## 2010-11-28 LAB — POCT URINALYSIS DIP (DEVICE)
Glucose, UA: NEGATIVE mg/dL
Glucose, UA: NEGATIVE mg/dL
Nitrite: NEGATIVE
Nitrite: NEGATIVE
Protein, ur: NEGATIVE mg/dL
Protein, ur: NEGATIVE mg/dL
Protein, ur: NEGATIVE mg/dL
Specific Gravity, Urine: 1.02 (ref 1.005–1.030)
Specific Gravity, Urine: 1.01 (ref 1.005–1.030)
Urobilinogen, UA: 0.2 mg/dL (ref 0.0–1.0)
Urobilinogen, UA: 0.2 mg/dL (ref 0.0–1.0)
Urobilinogen, UA: 0.2 mg/dL (ref 0.0–1.0)
pH: 6 (ref 5.0–8.0)
pH: 5.5 (ref 5.0–8.0)

## 2010-11-28 LAB — URINALYSIS, ROUTINE W REFLEX MICROSCOPIC
Hgb urine dipstick: NEGATIVE
Nitrite: NEGATIVE
Protein, ur: NEGATIVE mg/dL
Specific Gravity, Urine: 1.02 (ref 1.005–1.030)
Urobilinogen, UA: 0.2 mg/dL (ref 0.0–1.0)

## 2010-11-28 LAB — CBC
HCT: 35.2 % — ABNORMAL LOW (ref 36.0–46.0)
MCHC: 34 g/dL (ref 30.0–36.0)
MCV: 97.1 fL (ref 78.0–100.0)
Platelets: 188 10*3/uL (ref 150–400)

## 2010-11-28 LAB — URINE CULTURE: Colony Count: 75000

## 2010-11-28 LAB — URINE MICROSCOPIC-ADD ON

## 2010-11-28 LAB — WET PREP, GENITAL: Trich, Wet Prep: NONE SEEN

## 2010-12-01 LAB — COMPREHENSIVE METABOLIC PANEL
ALT: 17 U/L (ref 0–35)
AST: 20 U/L (ref 0–37)
AST: 23 U/L (ref 0–37)
Albumin: 2.6 g/dL — ABNORMAL LOW (ref 3.5–5.2)
Alkaline Phosphatase: 119 U/L — ABNORMAL HIGH (ref 39–117)
CO2: 21 mEq/L (ref 19–32)
CO2: 24 mEq/L (ref 19–32)
Calcium: 7.1 mg/dL — ABNORMAL LOW (ref 8.4–10.5)
Chloride: 107 mEq/L (ref 96–112)
Creatinine, Ser: 0.67 mg/dL (ref 0.4–1.2)
GFR calc Af Amer: >60 mL/min (ref >60)
GFR calc Af Amer: >60 mL/min (ref >60)
GFR calc non Af Amer: >60 mL/min (ref >60)
GFR calc non Af Amer: >60 mL/min (ref >60)
Potassium: 4.2 mEq/L (ref 3.5–5.1)
Total Bilirubin: 0.6 mg/dL (ref 0.3–1.2)
Total Protein: 4.8 g/dL — ABNORMAL LOW (ref 6.0–8.3)

## 2010-12-01 LAB — CBC
HCT: 35.3 % — ABNORMAL LOW (ref 36.0–46.0)
Hemoglobin: 10.5 g/dL — ABNORMAL LOW (ref 12.0–15.0)
MCHC: 33.3 g/dL (ref 30.0–36.0)
MCHC: 33.4 g/dL (ref 30.0–36.0)
MCV: 96 fL (ref 78.0–100.0)
Platelets: 190 10*3/uL (ref 150–400)
Platelets: 195 10*3/uL (ref 150–400)
RBC: 3.28 MIL/uL — ABNORMAL LOW (ref 3.87–5.11)
RBC: 3.44 MIL/uL — ABNORMAL LOW (ref 3.87–5.11)
RBC: 3.73 MIL/uL — ABNORMAL LOW (ref 3.87–5.11)
RDW: 13 % (ref 11.5–15.5)
WBC: 10 10*3/uL (ref 4.0–10.5)

## 2010-12-01 LAB — WET PREP, GENITAL
Trich, Wet Prep: NONE SEEN
Yeast Wet Prep HPF POC: NONE SEEN

## 2010-12-01 LAB — POCT URINALYSIS DIP (DEVICE)
Bilirubin Urine: NEGATIVE
Bilirubin Urine: NEGATIVE
Glucose, UA: NEGATIVE mg/dL
Glucose, UA: NEGATIVE mg/dL
Hgb urine dipstick: NEGATIVE
Ketones, ur: NEGATIVE mg/dL
Ketones, ur: NEGATIVE mg/dL
Nitrite: NEGATIVE
Nitrite: NEGATIVE
Protein, ur: NEGATIVE mg/dL
Urobilinogen, UA: 0.2 mg/dL (ref 0.0–1.0)
Urobilinogen, UA: 1 mg/dL (ref 0.0–1.0)
pH: 6.5 (ref 5.0–8.0)

## 2010-12-01 LAB — GC/CHLAMYDIA PROBE AMP, GENITAL
Chlamydia, DNA Probe: NEGATIVE
GC Probe Amp, Genital: NEGATIVE

## 2010-12-13 LAB — POCT URINALYSIS DIP (DEVICE)
Bilirubin Urine: NEGATIVE
Glucose, UA: NEGATIVE mg/dL
Hgb urine dipstick: NEGATIVE
Ketones, ur: NEGATIVE mg/dL
Nitrite: NEGATIVE
Protein, ur: NEGATIVE mg/dL
Specific Gravity, Urine: 1.02 (ref 1.005–1.030)
Urobilinogen, UA: 0.2 mg/dL (ref 0.0–1.0)
pH: 7 (ref 5.0–8.0)

## 2010-12-14 LAB — POCT URINALYSIS DIP (DEVICE)
Ketones, ur: NEGATIVE mg/dL
Protein, ur: NEGATIVE mg/dL
Specific Gravity, Urine: 1.015 (ref 1.005–1.030)
pH: 6.5 (ref 5.0–8.0)

## 2010-12-15 LAB — POCT URINALYSIS DIP (DEVICE)
Hgb urine dipstick: NEGATIVE
Ketones, ur: NEGATIVE mg/dL
Nitrite: NEGATIVE
Protein, ur: NEGATIVE mg/dL
Protein, ur: NEGATIVE mg/dL
Specific Gravity, Urine: 1.015 (ref 1.005–1.030)
Specific Gravity, Urine: 1.02 (ref 1.005–1.030)
Urobilinogen, UA: 0.2 mg/dL (ref 0.0–1.0)
Urobilinogen, UA: 0.2 mg/dL (ref 0.0–1.0)
pH: 7 (ref 5.0–8.0)
pH: 7 (ref 5.0–8.0)

## 2010-12-15 LAB — WET PREP, GENITAL: Yeast Wet Prep HPF POC: NONE SEEN

## 2010-12-15 LAB — URINALYSIS, ROUTINE W REFLEX MICROSCOPIC
Bilirubin Urine: NEGATIVE
Glucose, UA: NEGATIVE mg/dL
Ketones, ur: NEGATIVE mg/dL
pH: 6 (ref 5.0–8.0)

## 2010-12-15 LAB — GC/CHLAMYDIA PROBE AMP, GENITAL
Chlamydia, DNA Probe: NEGATIVE
GC Probe Amp, Genital: NEGATIVE

## 2010-12-15 LAB — URINE MICROSCOPIC-ADD ON

## 2010-12-15 LAB — COMPREHENSIVE METABOLIC PANEL
Albumin: 3 g/dL — ABNORMAL LOW (ref 3.5–5.2)
BUN: 5 mg/dL — ABNORMAL LOW (ref 6–23)
Chloride: 105 mEq/L (ref 96–112)
Creatinine, Ser: 0.52 mg/dL (ref 0.4–1.2)
Glucose, Bld: 84 mg/dL (ref 70–99)
Total Bilirubin: 0.2 mg/dL — ABNORMAL LOW (ref 0.3–1.2)

## 2010-12-15 LAB — CBC
HCT: 29.1 % — ABNORMAL LOW (ref 36.0–46.0)
MCV: 97 fL (ref 78.0–100.0)
Platelets: 185 10*3/uL (ref 150–400)
RDW: 12.7 % (ref 11.5–15.5)
WBC: 8.7 10*3/uL (ref 4.0–10.5)

## 2010-12-15 LAB — RAPID URINE DRUG SCREEN, HOSP PERFORMED
Cocaine: NOT DETECTED
Opiates: NOT DETECTED

## 2010-12-16 LAB — CBC
Hemoglobin: 10.5 g/dL — ABNORMAL LOW (ref 12.0–15.0)
MCHC: 34.9 g/dL (ref 30.0–36.0)
MCV: 96.2 fL (ref 78.0–100.0)
RBC: 3.13 MIL/uL — ABNORMAL LOW (ref 3.87–5.11)
WBC: 7.7 10*3/uL (ref 4.0–10.5)

## 2010-12-16 LAB — POCT URINALYSIS DIP (DEVICE)
Hgb urine dipstick: NEGATIVE
Ketones, ur: NEGATIVE mg/dL
Nitrite: NEGATIVE
Nitrite: NEGATIVE
Protein, ur: NEGATIVE mg/dL
Protein, ur: NEGATIVE mg/dL
Urobilinogen, UA: 0.2 mg/dL (ref 0.0–1.0)
Urobilinogen, UA: 0.2 mg/dL (ref 0.0–1.0)
pH: 6.5 (ref 5.0–8.0)
pH: 7 (ref 5.0–8.0)

## 2010-12-17 LAB — POCT URINALYSIS DIP (DEVICE)
Bilirubin Urine: NEGATIVE
Hgb urine dipstick: NEGATIVE
Nitrite: NEGATIVE
Specific Gravity, Urine: 1.025 (ref 1.005–1.030)
pH: 6 (ref 5.0–8.0)

## 2010-12-17 LAB — CBC
Hemoglobin: 10.9 g/dL — ABNORMAL LOW (ref 12.0–15.0)
MCHC: 34.4 g/dL (ref 30.0–36.0)
MCV: 97.3 fL (ref 78.0–100.0)
RDW: 12.9 % (ref 11.5–15.5)

## 2010-12-17 LAB — COMPREHENSIVE METABOLIC PANEL
ALT: 9 U/L (ref 0–35)
BUN: 5 mg/dL — ABNORMAL LOW (ref 6–23)
CO2: 25 mEq/L (ref 19–32)
Calcium: 9 mg/dL (ref 8.4–10.5)
Creatinine, Ser: 0.6 mg/dL (ref 0.4–1.2)
GFR calc non Af Amer: >60 mL/min (ref >60)
Glucose, Bld: 80 mg/dL (ref 70–99)
Sodium: 132 mEq/L — ABNORMAL LOW (ref 135–145)
Total Protein: 5.9 g/dL — ABNORMAL LOW (ref 6.0–8.3)

## 2010-12-19 LAB — CBC
MCHC: 34.6 g/dL (ref 30.0–36.0)
Platelets: 175 10*3/uL (ref 150–400)
RDW: 13 % (ref 11.5–15.5)

## 2010-12-19 LAB — URINALYSIS, ROUTINE W REFLEX MICROSCOPIC
Bilirubin Urine: NEGATIVE
Ketones, ur: NEGATIVE mg/dL
Leukocytes, UA: NEGATIVE
Nitrite: NEGATIVE
Nitrite: NEGATIVE
Protein, ur: NEGATIVE mg/dL
Specific Gravity, Urine: 1.015 (ref 1.005–1.030)
Urobilinogen, UA: 0.2 mg/dL (ref 0.0–1.0)
pH: 6.5 (ref 5.0–8.0)
pH: 6 (ref 5.0–8.0)

## 2010-12-19 LAB — ABO/RH: ABO/RH(D): O POS

## 2010-12-19 LAB — HCG, QUANTITATIVE, PREGNANCY
hCG, Beta Chain, Quant, S: 5234 m[IU]/mL — ABNORMAL HIGH (ref <5)
hCG, Beta Chain, Quant, S: 1606 m[IU]/mL — ABNORMAL HIGH (ref <5)

## 2010-12-19 LAB — URINE MICROSCOPIC-ADD ON

## 2010-12-19 LAB — WET PREP, GENITAL
Trich, Wet Prep: NONE SEEN
Yeast Wet Prep HPF POC: NONE SEEN

## 2010-12-19 LAB — POCT PREGNANCY, URINE: Preg Test, Ur: POSITIVE

## 2010-12-22 LAB — URINALYSIS, ROUTINE W REFLEX MICROSCOPIC
Glucose, UA: NEGATIVE mg/dL
Hgb urine dipstick: NEGATIVE
Ketones, ur: NEGATIVE mg/dL
Protein, ur: NEGATIVE mg/dL
Urobilinogen, UA: 1 mg/dL (ref 0.0–1.0)

## 2010-12-22 LAB — URINE CULTURE

## 2011-01-05 ENCOUNTER — Emergency Department (HOSPITAL_COMMUNITY)
Admission: EM | Admit: 2011-01-05 | Discharge: 2011-01-05 | Disposition: A | Discharge status: Home / Self Care | Payer: Self-pay | Attending: Emergency Medicine | Admitting: Emergency Medicine

## 2011-01-05 DIAGNOSIS — R300 Dysuria: Secondary | ICD-10-CM | POA: Insufficient documentation

## 2011-01-05 DIAGNOSIS — N390 Urinary tract infection, site not specified: Secondary | ICD-10-CM | POA: Insufficient documentation

## 2011-01-05 DIAGNOSIS — J42 Unspecified chronic bronchitis: Secondary | ICD-10-CM | POA: Insufficient documentation

## 2011-01-05 DIAGNOSIS — R319 Hematuria, unspecified: Secondary | ICD-10-CM | POA: Insufficient documentation

## 2011-01-05 LAB — URINALYSIS, ROUTINE W REFLEX MICROSCOPIC
Nitrite: POSITIVE — AB
Protein, ur: 100 mg/dL — AB
Specific Gravity, Urine: 1.025 (ref 1.005–1.030)
Urobilinogen, UA: 0.2 mg/dL (ref 0.0–1.0)

## 2011-01-05 LAB — URINE MICROSCOPIC-ADD ON

## 2011-01-05 LAB — POCT PREGNANCY, URINE: Preg Test, Ur: NEGATIVE

## 2011-01-25 NOTE — Op Note (Signed)
NAMEELHAM, FINI                ACCOUNT NO.:  [id number]   MEDICAL RECORD NO.:  [id number]          PATIENT TYPE:  AMB   LOCATION:  SDC                           FACILITY:  [facility]   PHYSICIAN:  Tanya S. Shawnie Pons, M.D.   DATE OF BIRTH:  01/28/1981   DATE OF PROCEDURE:  07/16/2008  DATE OF DISCHARGE:                               OPERATIVE REPORT   PREOPERATIVE DIAGNOSIS:  Right ectopic pregnancy.   POSTOPERATIVE DIAGNOSIS:  Right ectopic pregnancy.   PROCEDURE:  Right salpingectomy.   SURGEON:  Shelbie Proctor. Shawnie Pons, MD   ASSISTANT:  None.   ANESTHESIA:  General and local.   FINDINGS:  Hemoperitoneum, right ectopic pregnancy, aborted out the end  of the tube.   SPECIMEN:  Right tube to Pathology.   ESTIMATED BLOOD LOSS:  1000 mL in the abdomen.   COMPLICATIONS:  None immediately known.   REASON FOR PROCEDURE:  Briefly, the patient is a 30 year old gravida 3,  para 0-1-1-1, who had a ruptured right ectopic pregnancy, showed up with  2 weeks of pain.  She got so sick she had to leave work today, was found  to have a ruptured ectopic pregnancy by ultrasound with a 4.5-cm mass in  the right adnexa as well as free fluid there.  The patient had a beta-  hCG of 11,000 with no IUP.   PROCEDURE IN DETAIL:  The patient was taken to the OR where she was  placed in dorsal lithotomy in [place] stirrups.  She was prepped and  draped in the usual sterile fashion.  A Foley catheter was inserted in  the bladder.  The speculum was placed inside the vagina and the cervix  was visualized and grasped anteriorly with a single-tooth tenaculum, and  a Hulka tenaculum was placed through the cervix.  Attention was then  turned to the abdomen.  A 5 mL of 0.25% Marcaine were injected in the  umbilicus.  A 1.5-cm incision was made with a knife.  This was carried  down to the underlying fascia, which was incised sharply.  Hemostat was  then used to enter the peritoneum.  Since the peritoneum was entered,  blood  began rising up out of the abdomen through the incision.  Vicryl  sutures 2-0 on UR-6 needles were then used to tag each side of the  fascia and a Hasson trocar was placed and pneumoperitoneum was created.  Abdomen was filled with blood and pictures of this were taken.  The  liver edge appeared normal.  There were some adhesions of the ovary on  the left and the bladder to the anterior abdominal wall, also appears to  be a cyst on the left ovary as well.  There was lot of clot adherent to  the right tube and ovary and the site could not be seen well.  A 5-mm  port was placed under direct visualization.  A Nezhat was then used to  suck out the blood clots and it appeared that the right tube was  dilated.  There was tissue coming from the end of the fimbria, but  there  was no rupture of the tube, as they appears to have 1000 mL of blood in  the abdomen.  A second port was placed 4 mm above the first port under  direct visualization.  A 5-mm trocar was placed here as well.  The right  tube was taken down with the gyrus without difficulty.  A 5-mm scope was  then used and the EndoCatch bag was used to remove the tube and its  contents.  Afterwards, a 10-mm scope was placed back into the abdomen  and then a Nezhat was used to try to suck out the blood clot.  There  appeared to be tissue in the posterior cul-de-sac which could not be  easily removed, so despite by an effort to get this tissue out.  Eventually, the 5-mm scope was placed back inside and the large suction  tip of the Nezhat was used to get the tissue out.  The patient was  tipped up into reverse Trendelenburg and blood was suctioned out as best  as could be done.  The tubal bed was visualized and was well hemostatic.  The patient's pulse began to come down and she is no longer  hemodynamically unstable.  It was not felt that every bit of  tissue was  removed from the patient's abdomen and that there could be some floating  about  the liver or anywhere else.  For this reason, the patient will get  methotrexate postoperatively.  After hemostasis and suction of all the  blood clot that could be gotten out, the trocars were removed under  direct visualization without difficulty.  Blood continued to pool up out  of the ports.  The umbilical fascia was closed with 2-0 Vicryl sutures  on UR-6 with 2 figure-of-eight.  Umbilicus was closed with 3-0 Vicryl on  a X1 with a subcuticular stitch.  The two 5-mm ports were closed with 4-  0 Vicryl in a running subcuticular fashion.  Pressure dressings were  placed.  Hulka tenaculum was removed from the cervix and the Foley was  removed from the bladder.  All instrument and lap counts were correct  x2.  The patient was taken to recovery room in stable condition.  As  stated previously, she is going to receive methotrexate in recovery and  will need weekly beta's followed.      Shelbie Proctor. Shawnie Pons, M.D.  Electronically Signed     TSP/MEDQ  D:  07/16/2008  T:  07/17/2008  Job:  [id number]

## 2011-01-25 NOTE — Discharge Summary (Signed)
NAMETAQUANA, BARTLEY                ACCOUNT NO.:  [id number]   MEDICAL RECORD NO.:  [id number]          PATIENT TYPE:  OBV   LOCATION:  9319                          FACILITY:  [facility]   PHYSICIAN:  Tanya S. Shawnie Pons, M.D.   DATE OF BIRTH:  02-Jul-1981   DATE OF ADMISSION:  07/17/2008  DATE OF DISCHARGE:  07/19/2008                               DISCHARGE SUMMARY   FINAL DIAGNOSIS:  Acute blood loss secondary to ruptured ectopic  pregnancy.   PERTINENT LABS:  Hemoglobin initially was 7.6, followup hemoglobin was  6.4 on second hospital day.  Post transfusion hemoglobin was 11.1.  Following procedures, the patient received 3 units of packed red blood  cells.   REASON FOR ADMISSION:  Briefly, please see H and P in the chart.  The  patient is a 30 year old who status post laparoscopic removal of her  right tube with right ectopic pregnancy and acute hemoperitoneum on  July 16, 2008.  She returned the following day with headache and  dizziness.  Hemoglobin was found to be 7.4.  She underwent CT scan to  see if there is more acute blood loss in the abdomen.  She had lost 100  mL blood there.  Additionally, she was thought to maybe have a  subcutaneous hematoma and was admitted for monitoring.   HOSPITAL COURSE:  The patient was admitted to the floor.  She was  observed hospital day #2, hemoglobin dropped, so she was transfused 3  units of packed red blood cells, which she tolerated well.  The patient  was fairly observed for any worsening of hematoma and this seemed to be  not sure as she had some bruising around the incisions, but no  significant hematoma was noted.  Given the jump in her hemoglobin, we  will follow her hemoglobin, maybe with spuriously low and not quite that  low given her response to 3 units of packed cells.  However, the patient  felt much better, headache had improved.  It was felt that she was  stable for discharge on hospital day #3.   DISCHARGE DISPOSITION AND  CONDITION:  The patient discharged home in  good condition.  Followup will be in the GYN Clinic.  If the patient  already has this, she may restart her Percocet, which she was already  gotten a prescription for.  I have given her a note to be out of work  indefinitely and she can call to change this at any time, and we would  send her back to work when she feels ready to go.      Shelbie Proctor. Shawnie Pons, M.D.  Electronically Signed     TSP/MEDQ  D:  07/19/2008  T:  07/19/2008  Job:  [id number]

## 2011-01-28 NOTE — Discharge Summary (Signed)
NAME:  Marissa Meyer, Marissa Meyer                          ACCOUNT NO.:  [id number]   MEDICAL RECORD NO.:  [id number]                   PATIENT TYPE:  INP   LOCATION:  9106                                 FACILITY:  [facility]   PHYSICIAN:  Kathreen Cosier, M.D.           DATE OF BIRTH:  September 30, 1980   DATE OF ADMISSION:  04/08/2004  DATE OF DISCHARGE:                                 DISCHARGE SUMMARY   HOSPITAL COURSE:  The patient is a 30 year old primigravida with [facility]  July 22, 2004 who was admitted to the hospital with cramping and lower  abdominal pain.  She was found to be having premature contractions.  She was  3 cm dilated and bulging membranes and her contractions were stopped  successfully with magnesium sulfate 3 g per hour.  On admission she was  started on Cleocin 900 mg IV q.8h. and received betamethasone 12.5 IM x2  doses 24 hours apart.  On admission her hemoglobin was 12.6, white count 15,  sodium 138, potassium 3.5, chloride 102.  Ultrasound revealed that she was  [redacted] weeks pregnant.  AFI was 5.2.  She denied any leakage and she was kept in  bed on Trendelenburg position.  By August 4 she was having significant  contractions and she was still on magnesium sulfate.  By August 5 she had  stopped contracting and was started on terbutaline 2.5 p.o. q.4h. and  Cleocin 300 p.o. q.6h.  Her GBS culture was positive.  On the night of  August 14 she gushed fluid at 7:15 p.m.  Ultrasound showed AFI of 2, down  from 5.2.  Her ampicillin IV was restarted and sterile speculum exam  confirmed ruptured membranes.  She was now [redacted] weeks pregnant.  The patient  went into labor and had a normal vaginal delivery of a female, Apgars 6 and  8, at 1:35 a.m. on August 15.  The placenta was spontaneous and the baby  weighed 1 pound 11 ounces.  The patient did well.  Her hemoglobin post  delivery was 10.4.  She was discharged home on April 27, 2004 to see me in  6 weeks.   DISCHARGE DIAGNOSIS:  Status  post normal vaginal delivery at 27+ weeks of a  viable 1-pound 11-ounce female.                                               Kathreen Cosier, M.D.    BAM/MEDQ  D:  04/27/2004  T:  04/27/2004  Job:  [id number]

## 2011-02-20 ENCOUNTER — Inpatient Hospital Stay (HOSPITAL_COMMUNITY)
Admission: AD | Admit: 2011-02-20 | Discharge: 2011-02-20 | Disposition: A | Discharge status: Home / Self Care | Payer: Self-pay | Source: Ambulatory Visit | Attending: Family Medicine | Admitting: Family Medicine

## 2011-02-20 DIAGNOSIS — I10 Essential (primary) hypertension: Secondary | ICD-10-CM | POA: Insufficient documentation

## 2011-02-20 DIAGNOSIS — A5901 Trichomonal vulvovaginitis: Secondary | ICD-10-CM | POA: Insufficient documentation

## 2011-02-20 LAB — URINE MICROSCOPIC-ADD ON

## 2011-02-20 LAB — URINALYSIS, ROUTINE W REFLEX MICROSCOPIC
Glucose, UA: NEGATIVE mg/dL
Hgb urine dipstick: NEGATIVE
Specific Gravity, Urine: 1.01 (ref 1.005–1.030)
Urobilinogen, UA: 0.2 mg/dL (ref 0.0–1.0)

## 2011-02-22 LAB — GC/CHLAMYDIA PROBE AMP, URINE
Chlamydia, Swab/Urine, PCR: NEGATIVE
GC Probe Amp, Urine: NEGATIVE

## 2011-02-22 LAB — URINE CULTURE: Colony Count: 80000

## 2011-04-11 ENCOUNTER — Encounter (HOSPITAL_COMMUNITY): Payer: Self-pay | Admitting: *Deleted

## 2011-04-11 ENCOUNTER — Inpatient Hospital Stay (HOSPITAL_COMMUNITY): Payer: Self-pay

## 2011-04-11 ENCOUNTER — Inpatient Hospital Stay (HOSPITAL_COMMUNITY)
Admission: AD | Admit: 2011-04-11 | Discharge: 2011-04-11 | Disposition: A | Discharge status: Home / Self Care | Payer: Self-pay | Source: Ambulatory Visit | Attending: Obstetrics and Gynecology | Admitting: Obstetrics and Gynecology

## 2011-04-11 DIAGNOSIS — R5381 Other malaise: Secondary | ICD-10-CM | POA: Insufficient documentation

## 2011-04-11 DIAGNOSIS — R5383 Other fatigue: Secondary | ICD-10-CM | POA: Insufficient documentation

## 2011-04-11 DIAGNOSIS — N730 Acute parametritis and pelvic cellulitis: Secondary | ICD-10-CM

## 2011-04-11 DIAGNOSIS — N949 Unspecified condition associated with female genital organs and menstrual cycle: Secondary | ICD-10-CM | POA: Insufficient documentation

## 2011-04-11 DIAGNOSIS — M549 Dorsalgia, unspecified: Secondary | ICD-10-CM | POA: Insufficient documentation

## 2011-04-11 DIAGNOSIS — F17200 Nicotine dependence, unspecified, uncomplicated: Secondary | ICD-10-CM | POA: Insufficient documentation

## 2011-04-11 DIAGNOSIS — I10 Essential (primary) hypertension: Secondary | ICD-10-CM

## 2011-04-11 DIAGNOSIS — R109 Unspecified abdominal pain: Secondary | ICD-10-CM | POA: Insufficient documentation

## 2011-04-11 DIAGNOSIS — N898 Other specified noninflammatory disorders of vagina: Secondary | ICD-10-CM | POA: Insufficient documentation

## 2011-04-11 HISTORY — DX: Gonococcal infection, unspecified: A54.9

## 2011-04-11 HISTORY — DX: Chlamydial infection, unspecified: A74.9

## 2011-04-11 HISTORY — DX: Trichomoniasis, unspecified: A59.9

## 2011-04-11 HISTORY — DX: Essential (primary) hypertension: I10

## 2011-04-11 LAB — CBC
HCT: 35.9 % — ABNORMAL LOW (ref 36.0–46.0)
Hemoglobin: 12.3 g/dL (ref 12.0–15.0)
MCV: 94.5 fL (ref 78.0–100.0)
Platelets: 164 10*3/uL (ref 150–400)
RBC: 3.8 MIL/uL — ABNORMAL LOW (ref 3.87–5.11)
WBC: 23.8 10*3/uL — ABNORMAL HIGH (ref 4.0–10.5)

## 2011-04-11 LAB — DIFFERENTIAL
Eosinophils Relative: 0 % (ref 0–5)
Lymphocytes Relative: 4 % — ABNORMAL LOW (ref 12–46)
Lymphs Abs: 1.1 10*3/uL (ref 0.7–4.0)
Monocytes Absolute: 0.7 10*3/uL (ref 0.1–1.0)
Monocytes Relative: 3 % (ref 3–12)

## 2011-04-11 LAB — COMPREHENSIVE METABOLIC PANEL
ALT: 11 U/L (ref 0–35)
BUN: 9 mg/dL (ref 6–23)
CO2: 28 mEq/L (ref 19–32)
Calcium: 9.5 mg/dL (ref 8.4–10.5)
GFR calc Af Amer: >60 mL/min (ref >60)
GFR calc non Af Amer: >60 mL/min (ref >60)
Glucose, Bld: 112 mg/dL — ABNORMAL HIGH (ref 70–99)
Sodium: 133 mEq/L — ABNORMAL LOW (ref 135–145)

## 2011-04-11 LAB — URINALYSIS, ROUTINE W REFLEX MICROSCOPIC
Nitrite: NEGATIVE
Specific Gravity, Urine: 1.01 (ref 1.005–1.030)
Urobilinogen, UA: 0.2 mg/dL (ref 0.0–1.0)
pH: 6 (ref 5.0–8.0)

## 2011-04-11 LAB — URINE MICROSCOPIC-ADD ON

## 2011-04-11 MED ORDER — DOXYCYCLINE HYCLATE 100 MG PO CAPS: 100.0000 mg | ORAL_CAPSULE | Freq: Two times a day (BID) | ORAL | Status: AC

## 2011-04-11 MED ORDER — AZITHROMYCIN 250 MG PO TABS
1000.0000 mg | ORAL_TABLET | Freq: Once | ORAL | Status: AC
  Administered 2011-04-11: 1000 mg via ORAL
  Filled 2011-04-11: qty 4

## 2011-04-11 MED ORDER — KETOROLAC TROMETHAMINE 60 MG/2ML IM SOLN
60.0000 mg | Freq: Once | INTRAMUSCULAR | Status: AC
  Administered 2011-04-11: 60 mg via INTRAMUSCULAR
  Filled 2011-04-11: qty 2

## 2011-04-11 MED ORDER — PROMETHAZINE HCL 25 MG PO TABS: 25.0000 mg | ORAL_TABLET | Freq: Four times a day (QID) | ORAL | Status: DC | PRN

## 2011-04-11 MED ORDER — CEFTRIAXONE SODIUM 250 MG IJ SOLR
250.0000 mg | Freq: Once | INTRAMUSCULAR | Status: AC
  Administered 2011-04-11: 250 mg via INTRAMUSCULAR
  Filled 2011-04-11: qty 250

## 2011-04-11 MED ORDER — METRONIDAZOLE 500 MG PO TABS: 500.0000 mg | ORAL_TABLET | Freq: Two times a day (BID) | ORAL | Status: AC

## 2011-04-11 MED ORDER — IBUPROFEN 800 MG PO TABS: 800.0000 mg | ORAL_TABLET | Freq: Three times a day (TID) | ORAL | Status: AC

## 2011-04-11 NOTE — Progress Notes (Signed)
Pt states she was tx for trich x3 weeks ago, had unprotected sex 1 week ago. Pt states her partner did not get treated b/c he states he doesn't have trich.

## 2011-04-11 NOTE — Progress Notes (Signed)
DR. CONSTANT AT BEDSIDE

## 2011-04-11 NOTE — Progress Notes (Signed)
Upper abd pain, started 2 days ago.  Hx of ectopic.  Period was 3 wks late. Did not do preg test.

## 2011-04-11 NOTE — Progress Notes (Signed)
Pt c/o sharp lower abdominal pain x2 days white, orange discharge.

## 2011-04-11 NOTE — ED Provider Notes (Signed)
History     Chief Complaint  Patient presents with  . Abdominal Pain   HPI Marissa Meyer  Is a 30 y.o. AA with a history of ectopic pregnancy 2010. She was 3 weeks late for period but then started bleeding July 15 and lasted until the 29th. For the past 2 days she has had increasing lower abdominal pain that radiates to the right shoulder. C/o nausea but no vomiting. Hx of STI's.  Current sex partner x 2 months. Had STI's last month and was treated. Last pap smear 2011 and was normal. Urine pregnancy test here today is negative.  Past Medical History  Diagnosis Date  . Hypertension   . Chlamydia   . Gonorrhea   . Trichomonas     Past Surgical History  Procedure Date  . Dilation and curettage of uterus     No family history on file.  History  Substance Use Topics  . Smoking status: Current Everyday Smoker -- 1.0 packs/day  . Smokeless tobacco: Not on file  . Alcohol Use: Yes    OB History    Grav Para Term Preterm Abortions TAB SAB Ect Mult Living   3 2 1 1 1   1  2       Review of Systems  Constitutional: Positive for fatigue. Negative for fever, chills and diaphoresis.  HENT: Negative for ear pain, congestion, sore throat, facial swelling, neck pain, neck stiffness, dental problem and sinus pressure.   Eyes: Negative for photophobia, pain and discharge.  Respiratory: Negative for cough, chest tightness and wheezing.   Gastrointestinal: Positive for nausea and abdominal pain. Negative for vomiting, diarrhea and constipation.  Genitourinary: Positive for vaginal bleeding, vaginal discharge and pelvic pain. Negative for dysuria, frequency, flank pain and difficulty urinating.  Musculoskeletal: Positive for back pain. Negative for myalgias and gait problem.  Skin: Negative.  Negative for color change and rash.  Neurological: Negative for dizziness, speech difficulty, weakness, light-headedness, numbness and headaches.  Psychiatric/Behavioral: Negative for confusion and  agitation.    Physical Exam  BP 135/90  Pulse 119  Temp(Src) 97.9 F (36.6 C) (Oral)  Resp 20  Ht 4\' 9"  (1.448 m)  Wt 98 lb 9.6 oz (44.725 kg)  BMI 21.34 kg/m2  LMP 03/22/2011 The patient is tachycardic and hypertensive and appears very uncomfortable.  Physical Exam  Nursing note and vitals reviewed. Constitutional: She is oriented to person, place, and time. She appears well-developed and well-nourished.  Eyes: EOM are normal.  Neck: Neck supple.  Pulmonary/Chest: Effort normal.  Abdominal: Soft. There is tenderness in the right lower quadrant and left lower quadrant. There is guarding.         Extremely tender lower abdomen. Increased in LLQ.  Genitourinary: There is no lesion on the right labia. There is no lesion on the left labia. Uterus is tender. Cervix exhibits motion tenderness and discharge. Right adnexum displays tenderness. Left adnexum displays tenderness. There is bleeding around the vagina. Vaginal discharge found.       It is difficult to assess uterus and adnexa due to pain.  Musculoskeletal: Normal range of motion.  Neurological: She is alert and oriented to person, place, and time. No cranial nerve deficit.  Skin: Skin is warm and dry.    ED Course  Procedures  MDM Ultrasound shows small amount of FF in the pelvis.  Lab shows elevated white count of 23,000 and left shift.   Consult with Dr. Jolayne Panther and see will come to [facility] to evaluate  the patient and discuss inpatient vs outpatient treatment.  Patient's pain improved significantly with Toradol 60 mg.  The patient would like to be treated out patient and follow up in the GYN Clinic.   Assessment: PID  Plan: Rocephin 250 mg. IM          Zithromax 1 gram po Rx:   Flagyl 500 mg. Po bid x 10 days         Doxycycline 100 mg. Po bid x 10 days          Ibuprofen 800 mg. Po q 8 to 12 hours with food prn pain          Phenergan 25 mg. 1/2 to 1 tablet po q 4 hours prn          Follow up in GYN Clinic in 1  week.       [place], [place] 04/11/11 1451

## 2011-04-12 LAB — GC/CHLAMYDIA PROBE AMP, GENITAL
Chlamydia, DNA Probe: NEGATIVE
GC Probe Amp, Genital: POSITIVE — AB

## 2011-04-12 NOTE — ED Provider Notes (Signed)
Agree with above note.  Marissa Meyer 04/12/2011 7:06 AM

## 2011-04-14 ENCOUNTER — Telehealth (HOSPITAL_COMMUNITY): Payer: Self-pay | Admitting: *Deleted

## 2011-04-14 NOTE — Telephone Encounter (Signed)
Telephone call to patient regarding positive GC culture, patient notified.  Patient was treated at time of visit.  Instructed patient to notify her partner for treatment.  Report of treatment faxed to health department.

## 2011-04-18 ENCOUNTER — Emergency Department (HOSPITAL_COMMUNITY)
Admission: EM | Admit: 2011-04-18 | Discharge: 2011-04-18 | Disposition: A | Discharge status: Home / Self Care | Payer: Self-pay | Attending: Emergency Medicine | Admitting: Emergency Medicine

## 2011-04-18 DIAGNOSIS — F329 Major depressive disorder, single episode, unspecified: Secondary | ICD-10-CM | POA: Insufficient documentation

## 2011-04-18 DIAGNOSIS — F3289 Other specified depressive episodes: Secondary | ICD-10-CM | POA: Insufficient documentation

## 2011-04-18 DIAGNOSIS — K6289 Other specified diseases of anus and rectum: Secondary | ICD-10-CM | POA: Insufficient documentation

## 2011-04-18 DIAGNOSIS — K644 Residual hemorrhoidal skin tags: Secondary | ICD-10-CM | POA: Insufficient documentation

## 2011-05-10 IMAGING — US US OB FOLLOW-UP
1 series · 14 of 28 positions shown · non-contrast
Comparison: none

OBSTETRICAL ULTRASOUND:
 This ultrasound exam was performed in the [HOSPITAL] Ultrasound Department.  The OB US report was generated in the AS system, and faxed to the ordering physician.  This report is also available in [HOSPITAL]?s [employer] and in [REDACTED] PACS.

[Series 1: us ob follow up · 0.24mm/px · 14 of 29 slices shown]
[im 2/29]
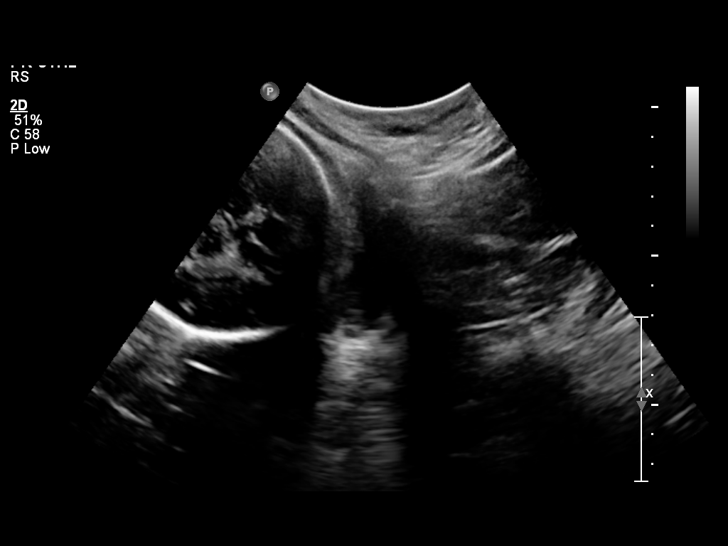
[im 4/29]
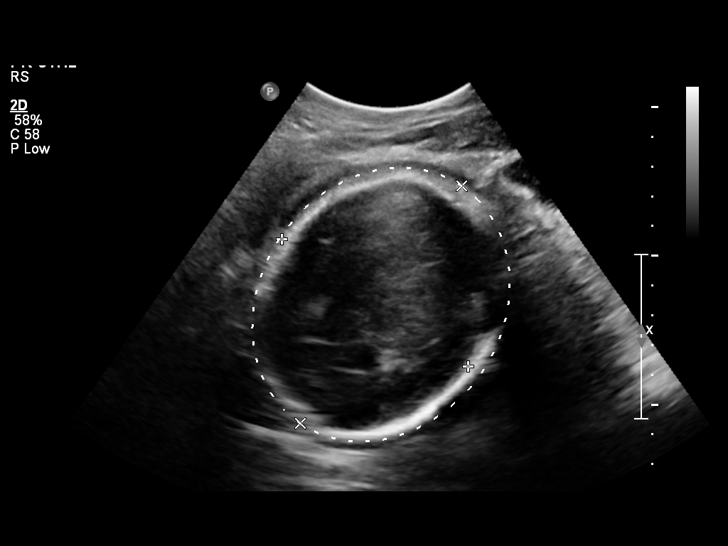
[im 6/29]
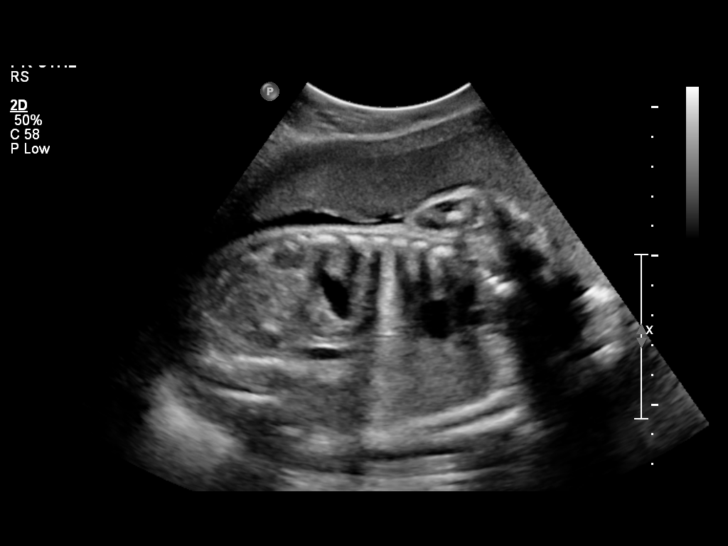
[im 8/29]
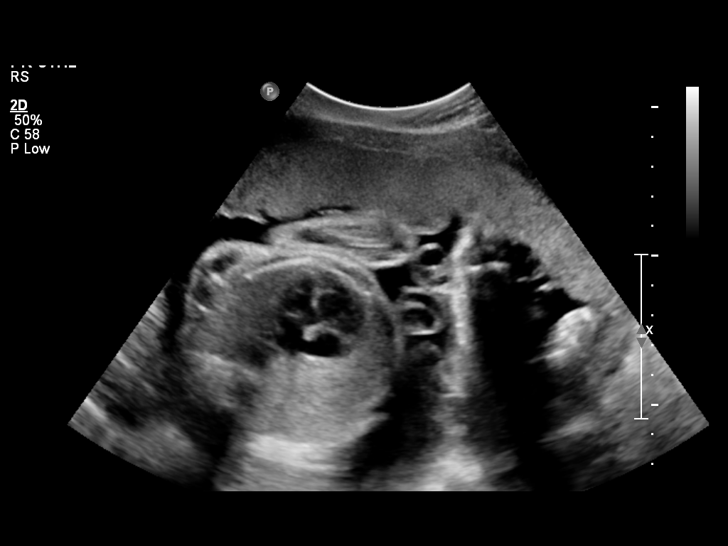
[im 10/29]
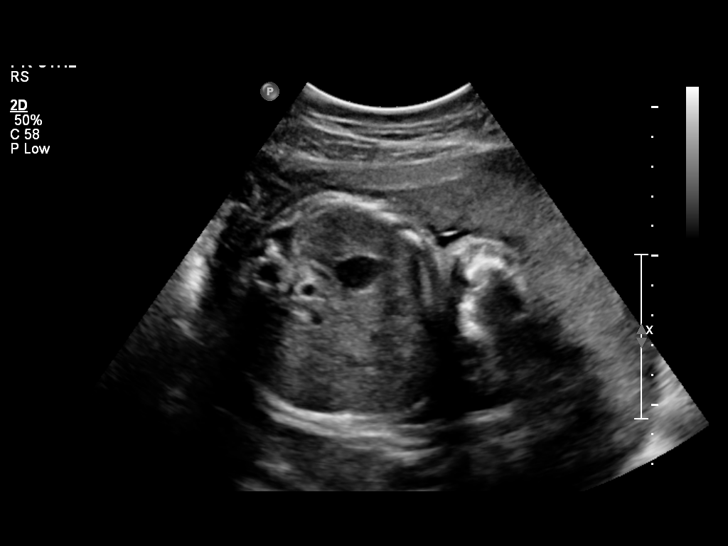
[im 12/29]
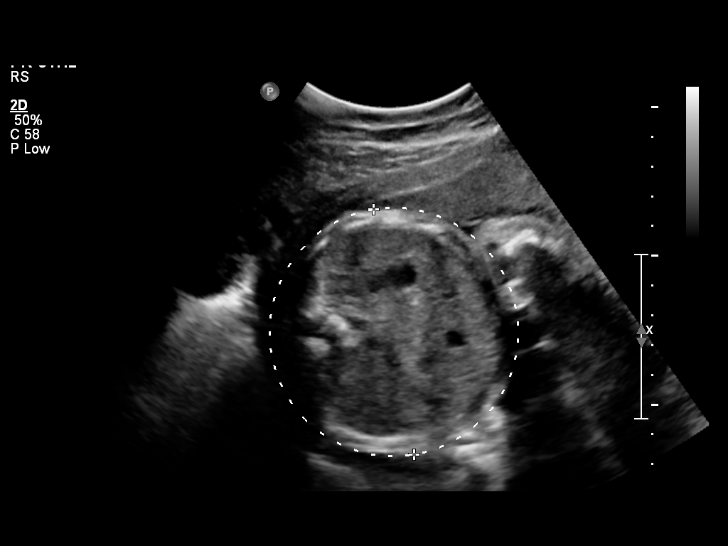
[im 14/29]
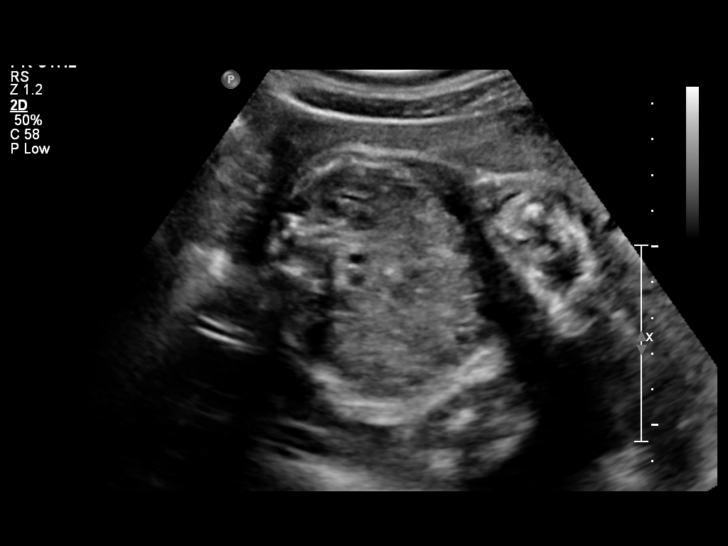
[im 16/29]
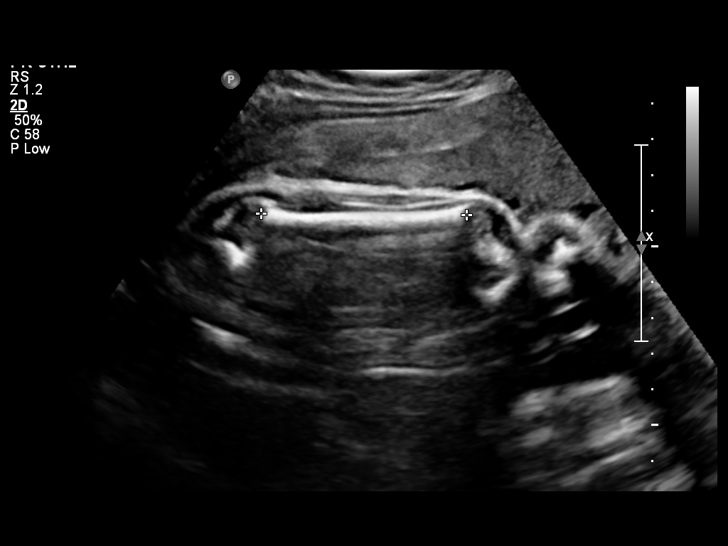
[im 18/29]
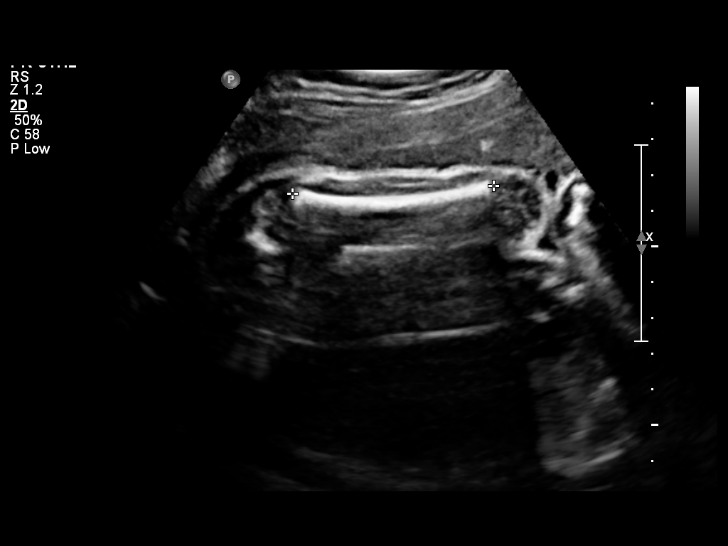
[im 20/29]
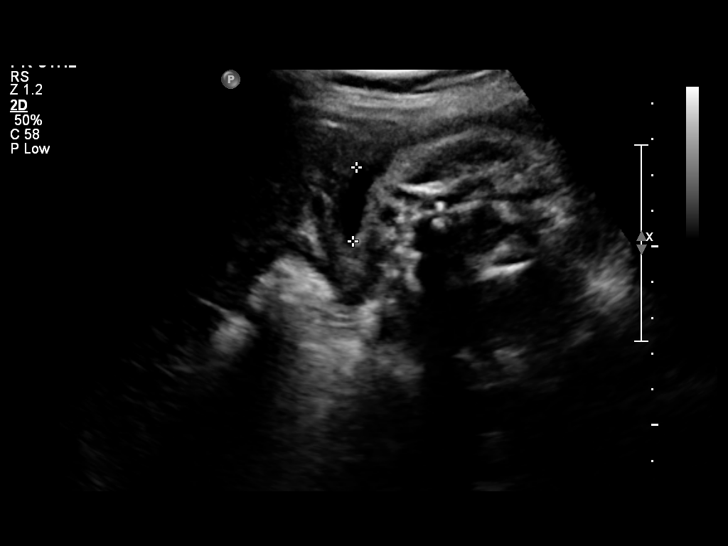
[im 22/29]
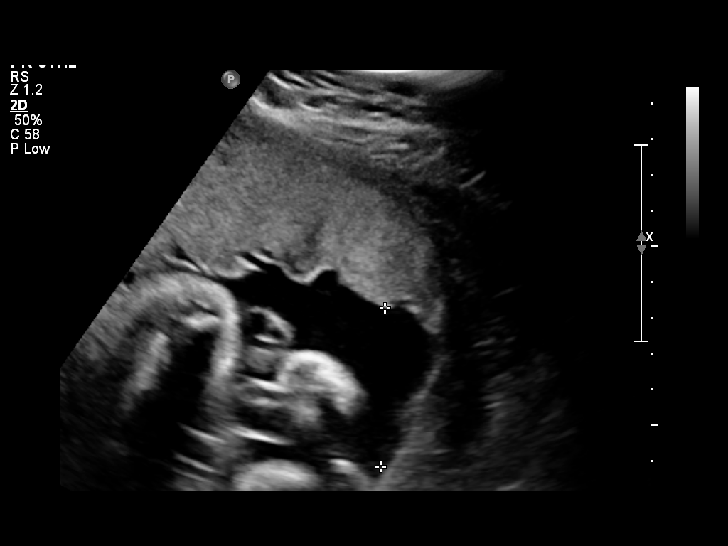
[im 24/29]
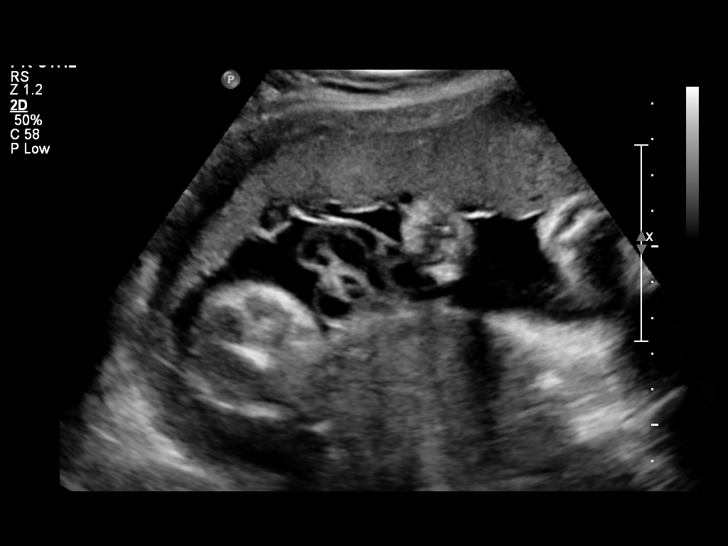
[im 26/29]
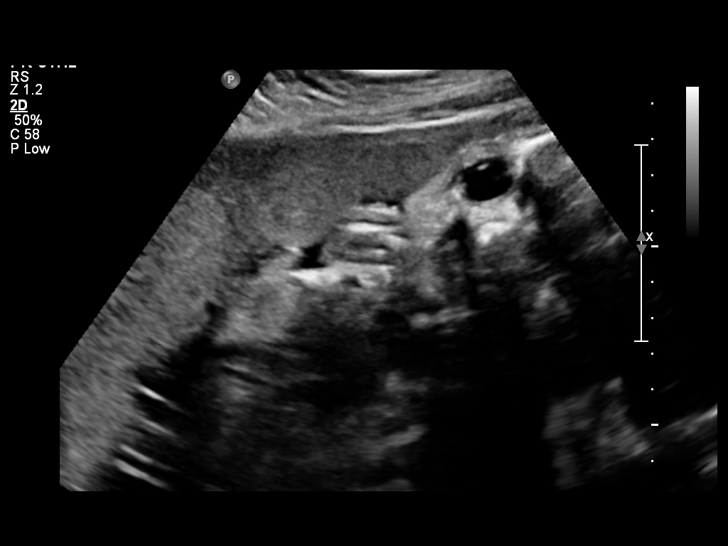
[im 29/29]
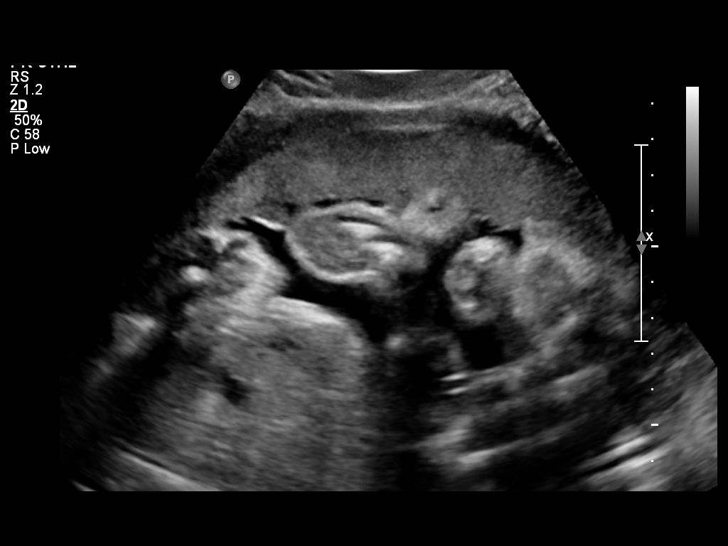

[14 of 28 positions shown; findings below may reference images not displayed]

IMPRESSION: See AS Obstetric US report.

## 2011-05-18 ENCOUNTER — Ambulatory Visit: Payer: Self-pay | Admitting: Family Medicine

## 2011-06-01 LAB — CBC
Platelets: 174
WBC: 7.6

## 2011-06-01 LAB — URINALYSIS, ROUTINE W REFLEX MICROSCOPIC
Leukocytes, UA: NEGATIVE
Protein, ur: NEGATIVE
Urobilinogen, UA: 0.2

## 2011-06-01 LAB — HCG, QUANTITATIVE, PREGNANCY: hCG, Beta Chain, Quant, S: <2

## 2011-06-01 LAB — WET PREP, GENITAL
Trich, Wet Prep: NONE SEEN
Yeast Wet Prep HPF POC: NONE SEEN

## 2011-06-10 LAB — URINE MICROSCOPIC-ADD ON

## 2011-06-10 LAB — CBC
HCT: 35.5 — ABNORMAL LOW
Hemoglobin: 12.1
MCHC: 34
MCV: 93.3
RBC: 3.8 — ABNORMAL LOW

## 2011-06-10 LAB — COMPREHENSIVE METABOLIC PANEL
CO2: 27
Calcium: 8.9
Creatinine, Ser: 0.81
GFR calc non Af Amer: >60
Glucose, Bld: 92

## 2011-06-10 LAB — URINALYSIS, ROUTINE W REFLEX MICROSCOPIC
Glucose, UA: NEGATIVE
Leukocytes, UA: NEGATIVE
Nitrite: NEGATIVE
Protein, ur: NEGATIVE

## 2011-06-10 LAB — DIFFERENTIAL
Basophils Absolute: 0
Eosinophils Absolute: 0
Lymphocytes Relative: 27
Lymphs Abs: 2
Neutrophils Relative %: 67

## 2011-06-10 LAB — WET PREP, GENITAL
Trich, Wet Prep: NONE SEEN
Yeast Wet Prep HPF POC: NONE SEEN

## 2011-06-14 LAB — COMPREHENSIVE METABOLIC PANEL
ALT: 8
ALT: 9
AST: 12
AST: 15
Albumin: 3.2 — ABNORMAL LOW
Alkaline Phosphatase: 42
CO2: 24
CO2: 26
Calcium: 8.1 — ABNORMAL LOW
Calcium: 8.4
Chloride: 106
Chloride: 108
GFR calc Af Amer: >60
GFR calc Af Amer: >60
GFR calc non Af Amer: >60
GFR calc non Af Amer: >60
Glucose, Bld: 126 — ABNORMAL HIGH
Sodium: 135
Sodium: 140
Total Bilirubin: 0.2 — ABNORMAL LOW
Total Bilirubin: 0.4

## 2011-06-14 LAB — CROSSMATCH
ABO/RH(D): O POS
Antibody Screen: NEGATIVE

## 2011-06-14 LAB — CBC
HCT: 18.8 — ABNORMAL LOW
HCT: 21.7 — ABNORMAL LOW
HCT: 27.8 — ABNORMAL LOW
Hemoglobin: 6.4 — CL
Hemoglobin: 7.6 — CL
MCHC: 35.2
MCV: 91.9
MCV: 93.5
MCV: 94.5
MCV: 95.8
Platelets: 150
RBC: 1.96 — ABNORMAL LOW
RBC: 2.94 — ABNORMAL LOW
RDW: 12.8
WBC: 6.3
WBC: 7.4
WBC: 9.6

## 2011-06-14 LAB — URINE CULTURE: Colony Count: >=100000

## 2011-06-14 LAB — URINALYSIS, ROUTINE W REFLEX MICROSCOPIC
Bilirubin Urine: NEGATIVE
Ketones, ur: 15 — AB
Ketones, ur: NEGATIVE
Leukocytes, UA: NEGATIVE
Leukocytes, UA: NEGATIVE
Nitrite: NEGATIVE
Nitrite: NEGATIVE
Protein, ur: 30 — AB
Protein, ur: NEGATIVE
Urobilinogen, UA: 0.2
Urobilinogen, UA: 0.2
pH: 6

## 2011-06-14 LAB — WET PREP, GENITAL

## 2011-06-14 LAB — URINE MICROSCOPIC-ADD ON

## 2011-06-14 LAB — POCT PREGNANCY, URINE: Preg Test, Ur: POSITIVE

## 2011-06-20 LAB — URINALYSIS, ROUTINE W REFLEX MICROSCOPIC
Bilirubin Urine: NEGATIVE
Nitrite: NEGATIVE
Protein, ur: NEGATIVE
Specific Gravity, Urine: 1.025
Urobilinogen, UA: 0.2

## 2011-06-20 LAB — URINE MICROSCOPIC-ADD ON

## 2011-06-20 LAB — PREGNANCY, URINE: Preg Test, Ur: POSITIVE

## 2012-04-23 ENCOUNTER — Encounter (HOSPITAL_COMMUNITY): Payer: Self-pay

## 2012-04-23 ENCOUNTER — Emergency Department (HOSPITAL_COMMUNITY)
Admission: EM | Admit: 2012-04-23 | Discharge: 2012-04-23 | Disposition: A | Discharge status: Home / Self Care | Payer: Self-pay | Attending: Emergency Medicine | Admitting: Emergency Medicine

## 2012-04-23 DIAGNOSIS — K2970 Gastritis, unspecified, without bleeding: Secondary | ICD-10-CM

## 2012-04-23 DIAGNOSIS — F17200 Nicotine dependence, unspecified, uncomplicated: Secondary | ICD-10-CM | POA: Insufficient documentation

## 2012-04-23 DIAGNOSIS — I10 Essential (primary) hypertension: Secondary | ICD-10-CM | POA: Insufficient documentation

## 2012-04-23 DIAGNOSIS — K2990 Gastroduodenitis, unspecified, without bleeding: Secondary | ICD-10-CM | POA: Insufficient documentation

## 2012-04-23 LAB — CBC WITH DIFFERENTIAL/PLATELET
Eosinophils Absolute: 0 10*3/uL (ref 0.0–0.7)
Eosinophils Relative: 0 % (ref 0–5)
HCT: 38.3 % (ref 36.0–46.0)
Hemoglobin: 13 g/dL (ref 12.0–15.0)
Lymphs Abs: 3 10*3/uL (ref 0.7–4.0)
MCH: 31.9 pg (ref 26.0–34.0)
MCV: 93.9 fL (ref 78.0–100.0)
Monocytes Absolute: 0.4 10*3/uL (ref 0.1–1.0)
Monocytes Relative: 5 % (ref 3–12)
RBC: 4.08 MIL/uL (ref 3.87–5.11)

## 2012-04-23 LAB — URINALYSIS, ROUTINE W REFLEX MICROSCOPIC
Bilirubin Urine: NEGATIVE
Glucose, UA: NEGATIVE mg/dL
Hgb urine dipstick: NEGATIVE
Specific Gravity, Urine: 1.02 (ref 1.005–1.030)
Urobilinogen, UA: 0.2 mg/dL (ref 0.0–1.0)
pH: 6 (ref 5.0–8.0)

## 2012-04-23 LAB — POCT I-STAT, CHEM 8
BUN: 8 mg/dL (ref 6–23)
Creatinine, Ser: 1.1 mg/dL (ref 0.50–1.10)
Glucose, Bld: 94 mg/dL (ref 70–99)
Hemoglobin: 14.6 g/dL (ref 12.0–15.0)
TCO2: 23 mmol/L (ref 0–100)

## 2012-04-23 MED ORDER — GI COCKTAIL ~~LOC~~
30.0000 mL | Freq: Once | ORAL | Status: AC
  Administered 2012-04-23: 30 mL via ORAL
  Filled 2012-04-23: qty 30

## 2012-04-23 MED ORDER — PANTOPRAZOLE SODIUM 20 MG PO TBEC: 40.0000 mg | DELAYED_RELEASE_TABLET | Freq: Every day | ORAL | Status: DC

## 2012-04-23 MED ORDER — ONDANSETRON HCL 4 MG PO TABS: 4.0000 mg | ORAL_TABLET | Freq: Four times a day (QID) | ORAL | Status: AC

## 2012-04-23 NOTE — Discharge Instructions (Signed)
Gastritis Gastritis is an irritation of the stomach. This is often caused by medications, but can be from anything that bothers the stomach. Other stomach irritants are:  Alcohol.   Caffeine.   Nicotine.   Spicy or acid foods.   Medications for pain and arthritis. Aspirin and other anti-inflammatory medicines such as ibuprofen (Advil), naproxen (Aleve), and ketoprofen (Orudis) can be highly irritating.   Emotional distress.  Symptoms of gastritis may include:  Abdominal pain.   Indigestion.   Nausea and or vomiting.   Bleeding.  Some patients with chronic gastritis and ulcers have been infected by a germ. They may need special testing. Medications which kill germs can be used to cure this condition. Treatment includes avoiding the substances mentioned above that are known to cause stomach trouble. Medications used to treat gastritis can include:  Antacids.   Medicines to control vomiting.   Acid blocking medicines.  Symptoms of gastritis usually improve within 2-3 days of starting treatment. Call your caregiver if you are not better in a few days. SEEK MEDICAL CARE IF:   You have increased stomach or chest pain.   You vomit blood.   You faint or feel lightheaded.   You cannot keep fluids down.   You pass bloody or black stools.   You develop severe back pain.  MAKE SURE YOU:   Understand these instructions.   Will watch your condition.   Will get help right away if you are not doing well or get worse.  Document Released: 08/29/2005 Document Revised: 08/18/2011 Document Reviewed: 02/14/2007 [employer] Patient Information 2012 [employer]. 

## 2012-04-23 NOTE — ED Notes (Signed)
Pt complains of nausea and abd pain since yesterday.

## 2012-04-24 NOTE — ED Provider Notes (Signed)
History     CSN: [id number]  Arrival date & time 04/23/12  1323   First MD Initiated Contact with Patient 04/23/12 1656      Chief Complaint  Patient presents with  . Abdominal Pain  . Nausea    (Consider location/radiation/quality/duration/timing/severity/associated sxs/prior treatment) HPI Comments: Pt states she ate some food that may have gone bad on Sat and has had nausea and abd pain since that time. She states "I feel like either the food is causing me to feel this way or I may be pregnant." Denies any vomiting, change in BMs, fever, chills, appetite change, vaginal bleeding/dc, urinary sx.  Patient is a 31 y.o. female presenting with abdominal pain. The history is provided by the patient.  Abdominal Pain The primary symptoms of the illness include abdominal pain and nausea. The primary symptoms of the illness do not include fever, fatigue, shortness of breath, vomiting, diarrhea, hematemesis, hematochezia, dysuria, vaginal discharge or vaginal bleeding. The current episode started 2 days ago. The onset of the illness was gradual. The problem has not changed since onset. The abdominal pain is located in the epigastric region. The abdominal pain does not radiate. The abdominal pain is relieved by nothing. The abdominal pain is exacerbated by certain positions.  Pregnant now: unsure. The patient has not had a change in bowel habit. Risk factors: no hx abd surg. Symptoms associated with the illness do not include chills, anorexia, diaphoresis, heartburn, constipation, urgency, hematuria, frequency or back pain.    Past Medical History  Diagnosis Date  . Hypertension   . Chlamydia   . Gonorrhea   . Trichomonas     Past Surgical History  Procedure Date  . Dilation and curettage of uterus     No family history on file.  History  Substance Use Topics  . Smoking status: Current Everyday Smoker -- 1.0 packs/day  . Smokeless tobacco: Not on file  . Alcohol Use: Yes    OB  History    Grav Para Term Preterm Abortions TAB SAB Ect Mult Living   3 2 1 1 1   1  2       Review of Systems  Constitutional: Negative for fever, chills, diaphoresis and fatigue.  Respiratory: Negative for shortness of breath.   Gastrointestinal: Positive for nausea and abdominal pain. Negative for heartburn, vomiting, diarrhea, constipation, hematochezia, anorexia and hematemesis.  Genitourinary: Negative for dysuria, urgency, frequency, hematuria, vaginal bleeding and vaginal discharge.  Musculoskeletal: Negative for back pain.  All other systems reviewed and are negative.    Allergies  Review of patient's allergies indicates no known allergies.  Home Medications   Current Outpatient Rx  Name Route Sig Dispense Refill  . AMLODIPINE BESYLATE 10 MG PO TABS Oral Take 10 mg by mouth daily.      Marland Kitchen HYDROCHLOROTHIAZIDE 25 MG PO TABS Oral Take 25 mg by mouth daily.      Marland Kitchen ONDANSETRON HCL 4 MG PO TABS Oral Take 1 tablet (4 mg total) by mouth every 6 (six) hours. 12 tablet 0  . PANTOPRAZOLE SODIUM 20 MG PO TBEC Oral Take 2 tablets (40 mg total) by mouth daily. 20 tablet 0    BP 144/89  Pulse 84  Temp 98.3 F (36.8 C) (Oral)  Resp 20  SpO2 100%  LMP 04/21/2012  Physical Exam  Nursing note and vitals reviewed. Constitutional: She appears well-developed and well-nourished. No distress.  HENT:  Head: Normocephalic and atraumatic.  Mouth/Throat: Oropharynx is clear and moist. No oropharyngeal  exudate.  Eyes:       Normal appearance  Neck: Normal range of motion.  Cardiovascular: Normal rate, regular rhythm and normal heart sounds.   Pulmonary/Chest: Effort normal and breath sounds normal. She exhibits no tenderness.  Abdominal: Soft. Bowel sounds are normal. She exhibits no distension and no mass. There is tenderness (mild epigastric ttp). There is no rebound and no guarding.  Musculoskeletal: Normal range of motion.  Neurological: She is alert.  Skin: Skin is warm and dry. She  is not diaphoretic.  Psychiatric: She has a normal mood and affect.    ED Course  Procedures (including critical care time)  Labs Reviewed  POCT I-STAT, CHEM 8 - Abnormal; Notable for the following:    Calcium, Ion 1.25 (*)     All other components within normal limits  URINALYSIS, ROUTINE W REFLEX MICROSCOPIC  CBC WITH DIFFERENTIAL  POCT PREGNANCY, URINE  LAB REPORT - SCANNED   No results found.   1. Gastritis       MDM  Pt presents with abd pain (epigastric) after eating some food which may have been bad. She is also unsure if she may be pregnant. She has had some nausea but no other sx. Very mild tenderness in epigastrium; pt states her pain has improved while waiting for a room. Pt treated with GI cocktail, had relief of sx with this. Urine preg neg. Labs reassuring. Suspect this may be gastritis; rxes given for this. Reasons to return to the ED discussed.       Grant Fontana, PA-C 04/24/12 2201

## 2012-04-26 NOTE — ED Provider Notes (Signed)
Medical screening examination/treatment/procedure(s) were performed by non-physician practitioner and as supervising physician I was immediately available for consultation/collaboration.  Geoffery Lyons, MD 04/26/12 [phone]

## 2012-05-02 ENCOUNTER — Encounter (HOSPITAL_COMMUNITY): Payer: Self-pay | Admitting: *Deleted

## 2012-05-02 ENCOUNTER — Emergency Department (HOSPITAL_COMMUNITY)
Admission: EM | Admit: 2012-05-02 | Discharge: 2012-05-02 | Disposition: A | Discharge status: Home / Self Care | Payer: Self-pay | Attending: Emergency Medicine | Admitting: Emergency Medicine

## 2012-05-02 DIAGNOSIS — R110 Nausea: Secondary | ICD-10-CM | POA: Insufficient documentation

## 2012-05-02 DIAGNOSIS — R51 Headache: Secondary | ICD-10-CM | POA: Insufficient documentation

## 2012-05-02 DIAGNOSIS — F17200 Nicotine dependence, unspecified, uncomplicated: Secondary | ICD-10-CM | POA: Insufficient documentation

## 2012-05-02 DIAGNOSIS — I10 Essential (primary) hypertension: Secondary | ICD-10-CM | POA: Insufficient documentation

## 2012-05-02 HISTORY — DX: Migraine, unspecified, not intractable, without status migrainosus: G43.909

## 2012-05-02 LAB — POCT I-STAT, CHEM 8
BUN: 14 mg/dL (ref 6–23)
Calcium, Ion: 1.16 mmol/L (ref 1.12–1.23)
Chloride: 103 mEq/L (ref 96–112)
Creatinine, Ser: 1.1 mg/dL (ref 0.50–1.10)
Sodium: 138 mEq/L (ref 135–145)
TCO2: 23 mmol/L (ref 0–100)

## 2012-05-02 MED ORDER — METOCLOPRAMIDE HCL 5 MG/ML IJ SOLN
10.0000 mg | Freq: Once | INTRAMUSCULAR | Status: AC
  Administered 2012-05-02: 10 mg via INTRAVENOUS
  Filled 2012-05-02: qty 2

## 2012-05-02 MED ORDER — KETOROLAC TROMETHAMINE 30 MG/ML IJ SOLN
30.0000 mg | Freq: Once | INTRAMUSCULAR | Status: AC
  Administered 2012-05-02: 30 mg via INTRAVENOUS
  Filled 2012-05-02: qty 1

## 2012-05-02 MED ORDER — DEXAMETHASONE SODIUM PHOSPHATE 10 MG/ML IJ SOLN
10.0000 mg | Freq: Once | INTRAMUSCULAR | Status: AC
  Administered 2012-05-02: 10 mg via INTRAVENOUS
  Filled 2012-05-02: qty 1

## 2012-05-02 MED ORDER — DIPHENHYDRAMINE HCL 50 MG/ML IJ SOLN
25.0000 mg | Freq: Once | INTRAMUSCULAR | Status: AC
  Administered 2012-05-02: 25 mg via INTRAVENOUS
  Filled 2012-05-02: qty 1

## 2012-05-02 MED ORDER — SODIUM CHLORIDE 0.9 % IV BOLUS (SEPSIS)
1000.0000 mL | Freq: Once | INTRAVENOUS | Status: AC
  Administered 2012-05-02: 1000 mL via INTRAVENOUS

## 2012-05-02 NOTE — ED Notes (Signed)
Pt is here with migraine headache that started at 0330 this am and is nauseated.  Pt has pain on right frontal area and over eye.  Sensitive to light and sound

## 2012-05-02 NOTE — ED Notes (Signed)
Pt staes has had a h/a since about 3 am staes feels dehydrated and doresn't drink a lot of water

## 2012-05-02 NOTE — ED Provider Notes (Signed)
Medical screening examination/treatment/procedure(s) were performed by non-physician practitioner and as supervising physician I was immediately available for consultation/collaboration.    Nelia Shi, MD 05/02/12 [phone]

## 2012-05-02 NOTE — ED Provider Notes (Signed)
History     CSN: [id number]  Arrival date & time 05/02/12  5284   First MD Initiated Contact with Patient 05/02/12 1013      Chief Complaint  Patient presents with  . Headache  . Nausea    (Consider location/radiation/quality/duration/timing/severity/associated sxs/prior treatment) HPI  31 y.o. female in no acute distress complaining of left frontal headache starting around 3:30 this a.m. she reports nausea with no vomiting. Pain is now 5/10 at onset it was more extreme at 8/10 she took 2 Tylenol this a.m. with moderate relief. She has photophobia and phonophobia. This headache is consistent with prior exacerbations. She denies any exacerbation with Valsalva, fever, neck stiffness.   Past Medical History  Diagnosis Date  . Hypertension   . Chlamydia   . Gonorrhea   . Trichomonas   . Migraines     but not diagnosed    Past Surgical History  Procedure Date  . Dilation and curettage of uterus     No family history on file.  History  Substance Use Topics  . Smoking status: Current Everyday Smoker -- 1.0 packs/day  . Smokeless tobacco: Not on file  . Alcohol Use: Yes     occ    OB History    Grav Para Term Preterm Abortions TAB SAB Ect Mult Living   3 2 1 1 1   1  2       Review of Systems  Constitutional: Negative for fever.  HENT: Negative for neck pain.   Gastrointestinal: Negative for nausea, vomiting and abdominal pain.  Genitourinary: Negative for dysuria and difficulty urinating.  Neurological: Positive for headaches. Negative for weakness and numbness.    Allergies  Review of patient's allergies indicates no known allergies.  Home Medications   Current Outpatient Rx  Name Route Sig Dispense Refill  . ACETAMINOPHEN 500 MG PO TABS Oral Take 1,000 mg by mouth every 6 (six) hours as needed. For migraines    . HYDROCHLOROTHIAZIDE 25 MG PO TABS Oral Take 25 mg by mouth daily.        BP 169/95  Pulse 79  Temp 97.3 F (36.3 C) (Oral)  Resp 18  SpO2  100%  LMP 04/21/2012  Physical Exam  Nursing note and vitals reviewed. Constitutional: She is oriented to person, place, and time. She appears well-developed and well-nourished. No distress.  HENT:  Head: Normocephalic and atraumatic.  Eyes: Conjunctivae and EOM are normal. Pupils are equal, round, and reactive to light.  Neck: Normal range of motion. Neck supple.  Cardiovascular: Normal rate, regular rhythm, normal heart sounds and intact distal pulses.   Pulmonary/Chest: Effort normal and breath sounds normal. No respiratory distress. She has no wheezes. She has no rales. She exhibits no tenderness.  Abdominal: Soft. Bowel sounds are normal.  Musculoskeletal: Normal range of motion.  Neurological: She is alert and oriented to person, place, and time.       Cranial nerves III through XII intact. Strength 5 out of 5x4 extremities. Finger to nose and heel-to-shin coordinated. Ambulatory coordinated gait. No pronator drift  Psychiatric: She has a normal mood and affect.    ED Course  Procedures (including critical care time)   Labs Reviewed  PREGNANCY, URINE  POCT I-STAT, CHEM 8   No results found.   1. Headache       MDM  31 y.o. female with frontal headache exacerbation consistent with prior episodes of headache. No red flags. Patient feels significantly better after 1 L of hydration. Neuro  exam is normal she is safe to DC to home         [facility], PA-C 05/02/12 1219

## 2012-09-14 ENCOUNTER — Emergency Department (HOSPITAL_COMMUNITY): Payer: Self-pay

## 2012-09-14 ENCOUNTER — Encounter (HOSPITAL_COMMUNITY): Payer: Self-pay | Admitting: Emergency Medicine

## 2012-09-14 ENCOUNTER — Emergency Department (HOSPITAL_COMMUNITY)
Admission: EM | Admit: 2012-09-14 | Discharge: 2012-09-14 | Disposition: A | Discharge status: Home / Self Care | Payer: Self-pay | Attending: Emergency Medicine | Admitting: Emergency Medicine

## 2012-09-14 DIAGNOSIS — F17200 Nicotine dependence, unspecified, uncomplicated: Secondary | ICD-10-CM | POA: Insufficient documentation

## 2012-09-14 DIAGNOSIS — IMO0001 Reserved for inherently not codable concepts without codable children: Secondary | ICD-10-CM | POA: Insufficient documentation

## 2012-09-14 DIAGNOSIS — I10 Essential (primary) hypertension: Secondary | ICD-10-CM | POA: Insufficient documentation

## 2012-09-14 DIAGNOSIS — R51 Headache: Secondary | ICD-10-CM | POA: Insufficient documentation

## 2012-09-14 DIAGNOSIS — R079 Chest pain, unspecified: Secondary | ICD-10-CM | POA: Insufficient documentation

## 2012-09-14 DIAGNOSIS — J069 Acute upper respiratory infection, unspecified: Secondary | ICD-10-CM | POA: Insufficient documentation

## 2012-09-14 DIAGNOSIS — J3489 Other specified disorders of nose and nasal sinuses: Secondary | ICD-10-CM | POA: Insufficient documentation

## 2012-09-14 DIAGNOSIS — Z8619 Personal history of other infectious and parasitic diseases: Secondary | ICD-10-CM | POA: Insufficient documentation

## 2012-09-14 DIAGNOSIS — M542 Cervicalgia: Secondary | ICD-10-CM | POA: Insufficient documentation

## 2012-09-14 DIAGNOSIS — J029 Acute pharyngitis, unspecified: Secondary | ICD-10-CM | POA: Insufficient documentation

## 2012-09-14 MED ORDER — GUAIFENESIN 100 MG/5ML PO LIQD: 100.0000 mg | ORAL | Status: DC | PRN

## 2012-09-14 MED ORDER — HYDROCODONE-ACETAMINOPHEN 7.5-500 MG/15ML PO SOLN: 15.0000 mL | Freq: Four times a day (QID) | ORAL | Status: DC | PRN

## 2012-09-14 NOTE — ED Provider Notes (Signed)
History   This chart was scribed for non-physician practitioner working with Raeford Razor, MD by Frederik Pear, ED Scribe. This patient was seen in room WTR7/WTR7 and the patient's care was started at 1436.   CSN: [id number]  Arrival date & time 09/14/12  1330   First MD Initiated Contact with Patient 09/14/12 1436      Chief Complaint  Patient presents with  . flu like symptoms    (Consider location/radiation/quality/duration/timing/severity/associated sxs/prior treatment) Patient is a 32 y.o. female presenting with URI. The history is provided by the patient. No language interpreter was used.  URI The primary symptoms include headaches, sore throat, cough and myalgias. Primary symptoms do not include nausea, vomiting or rash. The current episode started yesterday. This is a new problem.  The headache began yesterday. Headache is a new problem. The headache is present continuously.  The sore throat began yesterday. The sore throat has been rapidly improving since its onset. The sore throat is not accompanied by trouble swallowing.  The onset of the illness is associated with exposure to sick contacts. Symptoms associated with the illness include congestion. The illness is not associated with rhinorrhea.    Marissa Meyer is a 32 y.o. female who presents to the Emergency Department complaining of constant, moderate congestion with associated sinus pressure, headache that radiates to the neck, chest pain when coughing, which is productive, myalgias, that began yesterday. She also reports an associated sore throat that began last night, but has since resolved. She denies any associated rhinorrhea, sneezing, ear pain, nausea, emesis, diarrhea, dysuria, or rash. She states that she took orange juice and Alkaseltzer last night with no relief. She reports that her son is sick with a fever and coughing. She states that she is a current, 4 cigarettes a day, every day smoker. She has no h/o of asthma  or COPD.   Past Medical History  Diagnosis Date  . Hypertension   . Chlamydia   . Gonorrhea   . Trichomonas   . Migraines     but not diagnosed    Past Surgical History  Procedure Date  . Dilation and curettage of uterus     No family history on file.  History  Substance Use Topics  . Smoking status: Current Every Day Smoker -- 4.0 packs/day    Types: Cigarettes  . Smokeless tobacco: Not on file  . Alcohol Use: Yes     Comment: occ    OB History    Grav Para Term Preterm Abortions TAB SAB Ect Mult Living   3 2 1 1 1   1  2       Review of Systems  HENT: Positive for congestion, sore throat and neck pain. Negative for rhinorrhea and trouble swallowing.   Respiratory: Positive for cough.   Cardiovascular: Positive for chest pain.  Gastrointestinal: Negative for nausea and vomiting.  Genitourinary: Negative for dysuria.  Musculoskeletal: Positive for myalgias.  Skin: Negative for rash.  Neurological: Positive for headaches.  All other systems reviewed and are negative.    Allergies  Review of patient's allergies indicates no known allergies.  Home Medications   Current Outpatient Rx  Name  Route  Sig  Dispense  Refill  . HYDROCHLOROTHIAZIDE 25 MG PO TABS   Oral   Take 25 mg by mouth daily.           . IBUPROFEN 200 MG PO TABS   Oral   Take 400 mg by mouth every 8 (eight) hours  as needed. For pain.           BP 148/85  Pulse 104  Temp 98.7 F (37.1 C) (Oral)  Resp 18  SpO2 98%  LMP 09/01/2012  Physical Exam  Nursing note and vitals reviewed. Constitutional: She is oriented to person, place, and time. She appears well-developed and well-nourished.  HENT:  Head: Normocephalic and atraumatic.  Right Ear: External ear normal.  Nose: Nose normal.  Mouth/Throat: Uvula is midline.       There is mild erythema in her left TM.  Cardiovascular: Normal rate, regular rhythm and normal heart sounds.   No murmur heard. Pulmonary/Chest: Effort  normal and breath sounds normal. No respiratory distress. She has no wheezes. She has no rales. She exhibits no tenderness.  Abdominal: Soft. Bowel sounds are normal. There is no tenderness.  Musculoskeletal: Normal range of motion.  Lymphadenopathy:    She has no cervical adenopathy.  Neurological: She is alert and oriented to person, place, and time.  Psychiatric: She has a normal mood and affect. Thought content normal.    ED Course  Procedures (including critical care time)  DIAGNOSTIC STUDIES: Oxygen Saturation is 98% on room air, normal by my interpretation.    COORDINATION OF CARE:  14:44- Discussed planned course of treatment with the patient, including cough syrup, who is agreeable at this time.  Labs Reviewed - No data to display Dg Chest 2 View  09/14/2012  *RADIOLOGY REPORT*  Clinical Data: Smoker with fever, cough, and shortness of breath.  CHEST - 2 VIEW  Comparison: Two-view chest x-ray 11/04/2009, 04/18/2008, 08/26/2007.  Findings: Cardiomediastinal silhouette unremarkable, unchanged. Lungs clear.  Bronchovascular markings normal.  Pulmonary vascularity normal.  No pneumothorax.  No pleural effusions. Slight thoracolumbar scoliosis convex right.  IMPRESSION: No acute cardiopulmonary disease.   Original Report Authenticated By: Hulan Saas, M.D.      No diagnosis found.  1. URI  MDM   Will give her Lortab elixir. Her chest X-ray is unremarkable.  BP 148/85  Pulse 104  Temp 98.7 F (37.1 C) (Oral)  Resp 18  SpO2 98%  LMP 09/01/2012  I have reviewed nursing notes and vital signs. I personally reviewed the imaging tests through PACS system  I reviewed available ER/hospitalization records thought the EMR   I personally performed the services described in this documentation, which was scribed in my presence. The recorded information has been reviewed and is accurate.         Fayrene Helper, PA-C 09/14/12 1541

## 2012-09-14 NOTE — ED Notes (Signed)
Pt presenting to ed with c/o flu like symptoms. Pt states positive body aches, cough, sinus pressure, headache. Pt states chest pain only with cough. Pt states her son was recently treated for a cold and she thinks she got this from him. Pt states she has also had a fever and chills.

## 2012-09-14 NOTE — ED Provider Notes (Signed)
Medical screening examination/treatment/procedure(s) were performed by non-physician practitioner and as supervising physician I was immediately available for consultation/collaboration.  Renly Roots, MD 09/14/12 1631 

## 2013-04-30 ENCOUNTER — Emergency Department (HOSPITAL_COMMUNITY)
Admission: EM | Admit: 2013-04-30 | Discharge: 2013-04-30 | Disposition: A | Discharge status: Home / Self Care | Payer: Self-pay | Attending: Emergency Medicine | Admitting: Emergency Medicine

## 2013-04-30 ENCOUNTER — Encounter (HOSPITAL_COMMUNITY): Payer: Self-pay | Admitting: Emergency Medicine

## 2013-04-30 DIAGNOSIS — F17200 Nicotine dependence, unspecified, uncomplicated: Secondary | ICD-10-CM | POA: Insufficient documentation

## 2013-04-30 DIAGNOSIS — Z3202 Encounter for pregnancy test, result negative: Secondary | ICD-10-CM | POA: Insufficient documentation

## 2013-04-30 DIAGNOSIS — N390 Urinary tract infection, site not specified: Secondary | ICD-10-CM | POA: Insufficient documentation

## 2013-04-30 DIAGNOSIS — Z8679 Personal history of other diseases of the circulatory system: Secondary | ICD-10-CM | POA: Insufficient documentation

## 2013-04-30 DIAGNOSIS — I10 Essential (primary) hypertension: Secondary | ICD-10-CM | POA: Insufficient documentation

## 2013-04-30 DIAGNOSIS — R110 Nausea: Secondary | ICD-10-CM | POA: Insufficient documentation

## 2013-04-30 DIAGNOSIS — Z8619 Personal history of other infectious and parasitic diseases: Secondary | ICD-10-CM | POA: Insufficient documentation

## 2013-04-30 LAB — URINALYSIS, ROUTINE W REFLEX MICROSCOPIC
Bilirubin Urine: NEGATIVE
Ketones, ur: NEGATIVE mg/dL
Protein, ur: 30 mg/dL — AB
Urobilinogen, UA: 0.2 mg/dL (ref 0.0–1.0)

## 2013-04-30 LAB — URINE MICROSCOPIC-ADD ON

## 2013-04-30 MED ORDER — CEPHALEXIN 500 MG PO CAPS: 500.0000 mg | ORAL_CAPSULE | Freq: Four times a day (QID) | ORAL | Status: DC

## 2013-04-30 NOTE — ED Provider Notes (Signed)
CSN: [id number]     Arrival date & time 04/30/13  1304 History    This chart was scribed for Layla Maw Ward, DO, by Yevette Edwards, ED Scribe. This patient was seen in room WTR8/WTR8 and the patient's care was started at 3:32 PM.   First MD Initiated Contact with Patient 04/30/13 1458     Chief Complaint  Patient presents with  . Dysuria    The history is provided by the patient. No language interpreter was used.   HPI Comments:  Marissa Meyer is a 32 y.o. female who presents to the Emergency Department whose chief complaint is dysuria which began yesterday. She has experienced burning with urination and nausea.The pt tested her symptoms with a UTI test from [facility], and the test turned positive. She denies fever, chills, emesis, abdominal pain.  The pt is unsure if she could be pregnant. She denies any known allergies.   Past Medical History  Diagnosis Date  . Hypertension   . Chlamydia   . Gonorrhea   . Trichomonas   . Migraines     but not diagnosed   Past Surgical History  Procedure Laterality Date  . Dilation and curettage of uterus     No family history on file. History  Substance Use Topics  . Smoking status: Current Every Day Smoker -- 4.00 packs/day    Types: Cigarettes  . Smokeless tobacco: Not on file  . Alcohol Use: Yes     Comment: occ   OB History   Grav Para Term Preterm Abortions TAB SAB Ect Mult Living   3 2 1 1 1   1  2      Review of Systems  A complete 10 system review of systems was obtained, and all systems were negative except where indicated in the HPI and PE.   Allergies  Review of patient's allergies indicates no known allergies.  Home Medications  No current outpatient prescriptions on file.  Triage Vitals: BP 138/90  Pulse 87  Temp(Src) 98.2 F (36.8 C) (Oral)  Resp 18  SpO2 100%  LMP 04/01/2013  Physical Exam  Nursing note and vitals reviewed. Constitutional: She is oriented to person, place, and time. She appears  well-developed and well-nourished. No distress.  HENT:  Head: Normocephalic and atraumatic.  Eyes: EOM are normal.  Neck: Neck supple. No tracheal deviation present.  Cardiovascular: Normal rate.   Pulmonary/Chest: Effort normal. No respiratory distress.  Abdominal: She exhibits no distension and no mass. There is no tenderness. There is no rebound and no guarding.  Musculoskeletal: Normal range of motion.  Neurological: She is alert and oriented to person, place, and time.  Skin: Skin is warm and dry.  Psychiatric: She has a normal mood and affect. Her behavior is normal.    ED Course   DIAGNOSTIC STUDIES:  Oxygen Saturation is 100% on room air, normal by my interpretation.    COORDINATION OF CARE:  3:34 PM- Discussed treatment plan with patient, and the patient agreed to the plan.   Procedures (including critical care time)  Labs Reviewed  URINALYSIS, ROUTINE W REFLEX MICROSCOPIC - Abnormal; Notable for the following:    APPearance CLOUDY (*)    Hgb urine dipstick LARGE (*)    Protein, ur 30 (*)    Leukocytes, UA LARGE (*)    All other components within normal limits  URINE MICROSCOPIC-ADD ON - Abnormal; Notable for the following:    Bacteria, UA MANY (*)    All other components  within normal limits  URINE CULTURE  POCT PREGNANCY, URINE   Results for orders placed during the hospital encounter of 04/30/13  URINALYSIS, ROUTINE W REFLEX MICROSCOPIC      Result Value Range   Color, Urine YELLOW  YELLOW   APPearance CLOUDY (*) CLEAR   Specific Gravity, Urine 1.010  1.005 - 1.030   pH 6.0  5.0 - 8.0   Glucose, UA NEGATIVE  NEGATIVE mg/dL   Hgb urine dipstick LARGE (*) NEGATIVE   Bilirubin Urine NEGATIVE  NEGATIVE   Ketones, ur NEGATIVE  NEGATIVE mg/dL   Protein, ur 30 (*) NEGATIVE mg/dL   Urobilinogen, UA 0.2  0.0 - 1.0 mg/dL   Nitrite NEGATIVE  NEGATIVE   Leukocytes, UA LARGE (*) NEGATIVE  URINE MICROSCOPIC-ADD ON      Result Value Range   Squamous Epithelial /  LPF RARE  RARE   WBC, UA 21-50  <3 WBC/hpf   RBC / HPF 7-10  <3 RBC/hpf   Bacteria, UA MANY (*) RARE  POCT PREGNANCY, URINE      Result Value Range   Preg Test, Ur NEGATIVE  NEGATIVE   No results found.   1. UTI (lower urinary tract infection)     MDM  Patient with dysuria x1 day. She states that she had a positive UTI test that she got at the grocery store.  Will treat for UTI. Patient is stable and ready for discharge. Return precautions are given.  I personally performed the services described in this documentation, which was scribed in my presence. The recorded information has been reviewed and is accurate.    Roxy Horseman, PA-C 04/30/13 1622

## 2013-04-30 NOTE — ED Notes (Signed)
Pt c/o dysuria x 1 wk.  States she took a drug store test to test for UTI, it was positive.

## 2013-05-01 NOTE — ED Provider Notes (Signed)
Medical screening examination/treatment/procedure(s) were performed by non-physician practitioner and as supervising physician I was immediately available for consultation/collaboration.  Layla Maw Ward, DO 05/01/13 0009

## 2013-05-02 LAB — URINE CULTURE: Colony Count: >=100000

## 2013-05-03 NOTE — ED Notes (Signed)
Patient treated with Cephalexin-ok per protocol MD. 

## 2013-09-10 ENCOUNTER — Inpatient Hospital Stay (HOSPITAL_COMMUNITY): Payer: Medicaid Other

## 2013-09-10 ENCOUNTER — Encounter (HOSPITAL_COMMUNITY): Payer: Self-pay | Admitting: *Deleted

## 2013-09-10 ENCOUNTER — Inpatient Hospital Stay (HOSPITAL_COMMUNITY)
Admission: AD | Admit: 2013-09-10 | Discharge: 2013-09-10 | Disposition: A | Discharge status: Home / Self Care | Payer: Medicaid Other | Source: Ambulatory Visit | Attending: Obstetrics and Gynecology | Admitting: Obstetrics and Gynecology

## 2013-09-10 DIAGNOSIS — Z3490 Encounter for supervision of normal pregnancy, unspecified, unspecified trimester: Secondary | ICD-10-CM

## 2013-09-10 DIAGNOSIS — N760 Acute vaginitis: Secondary | ICD-10-CM | POA: Insufficient documentation

## 2013-09-10 DIAGNOSIS — B9689 Other specified bacterial agents as the cause of diseases classified elsewhere: Secondary | ICD-10-CM | POA: Insufficient documentation

## 2013-09-10 DIAGNOSIS — R109 Unspecified abdominal pain: Secondary | ICD-10-CM | POA: Insufficient documentation

## 2013-09-10 DIAGNOSIS — O98819 Other maternal infectious and parasitic diseases complicating pregnancy, unspecified trimester: Secondary | ICD-10-CM | POA: Insufficient documentation

## 2013-09-10 DIAGNOSIS — A5901 Trichomonal vulvovaginitis: Secondary | ICD-10-CM | POA: Insufficient documentation

## 2013-09-10 DIAGNOSIS — O2390 Unspecified genitourinary tract infection in pregnancy, unspecified trimester: Secondary | ICD-10-CM | POA: Insufficient documentation

## 2013-09-10 DIAGNOSIS — Z1389 Encounter for screening for other disorder: Secondary | ICD-10-CM

## 2013-09-10 DIAGNOSIS — Z87891 Personal history of nicotine dependence: Secondary | ICD-10-CM | POA: Insufficient documentation

## 2013-09-10 DIAGNOSIS — A499 Bacterial infection, unspecified: Secondary | ICD-10-CM | POA: Insufficient documentation

## 2013-09-10 HISTORY — DX: Unspecified ectopic pregnancy without intrauterine pregnancy: O00.90

## 2013-09-10 LAB — URINE MICROSCOPIC-ADD ON

## 2013-09-10 LAB — URINALYSIS, ROUTINE W REFLEX MICROSCOPIC
Bilirubin Urine: NEGATIVE
Glucose, UA: NEGATIVE mg/dL
Ketones, ur: NEGATIVE mg/dL
pH: 6.5 (ref 5.0–8.0)

## 2013-09-10 LAB — CBC
MCH: 31.8 pg (ref 26.0–34.0)
Platelets: 195 10*3/uL (ref 150–400)
RBC: 3.62 MIL/uL — ABNORMAL LOW (ref 3.87–5.11)
RDW: 12.2 % (ref 11.5–15.5)
WBC: 7.6 10*3/uL (ref 4.0–10.5)

## 2013-09-10 LAB — HCG, QUANTITATIVE, PREGNANCY: hCG, Beta Chain, Quant, S: 51451 m[IU]/mL — ABNORMAL HIGH (ref <5)

## 2013-09-10 LAB — WET PREP, GENITAL: Yeast Wet Prep HPF POC: NONE SEEN

## 2013-09-10 MED ORDER — METRONIDAZOLE 500 MG PO TABS: 500.0000 mg | ORAL_TABLET | Freq: Two times a day (BID) | ORAL | Status: DC

## 2013-09-10 MED ORDER — ONDANSETRON HCL 4 MG PO TABS: 4.0000 mg | ORAL_TABLET | Freq: Three times a day (TID) | ORAL | Status: DC | PRN

## 2013-09-10 NOTE — MAU Provider Note (Signed)
History     CSN: [id number]  Arrival date and time: 09/10/13 1022   First Provider Initiated Contact with Patient 09/10/13 1159      Chief Complaint  Patient presents with  . Abdominal Pain   HPI This is a 32 y.o. female at [redacted]w[redacted]d who presents with lower abdominal pain, which comes and goes. Denies bleeding. Has some nausea   RN Note: + HPT. Abdominal pain X 2 days, intermittent. Denies V/D, has some nausea. No vaginal D/C or bleeding.       OB History   Grav Para Term Preterm Abortions TAB SAB Ect Mult Living   4 2 1 1 1   1  2       Past Medical History  Diagnosis Date  . Hypertension   . Chlamydia   . Gonorrhea   . Trichomonas   . Migraines     but not diagnosed  . Ectopic pregnancy   . Preterm delivery     Past Surgical History  Procedure Laterality Date  . Dilation and curettage of uterus    . Ectopic pregnancy surgery      History reviewed. No pertinent family history.  History  Substance Use Topics  . Smoking status: Current Every Day Smoker -- 0.25 packs/day    Types: Cigarettes  . Smokeless tobacco: Never Used  . Alcohol Use: Yes     Comment: occ    Allergies:  Allergies  Allergen Reactions  . Amoxicillin Hives    Prescriptions prior to admission  Medication Sig Dispense Refill  . ibuprofen (ADVIL,MOTRIN) 200 MG tablet Take 200 mg by mouth every 6 (six) hours as needed for headache.      Marland Kitchen PRESCRIPTION MEDICATION Take 1 tablet by mouth daily.        Review of Systems  Constitutional: Negative for fever and chills.  Gastrointestinal: Positive for nausea and abdominal pain. Negative for vomiting, diarrhea and constipation.  Genitourinary: Negative for dysuria.  Musculoskeletal: Negative for myalgias.  Neurological: Negative for dizziness and headaches.   Physical Exam   Blood pressure 149/78, pulse 88, temperature 98.4 F (36.9 C), temperature source Oral, resp. rate 18, height 4\' 9"  (1.448 m), weight 51.71 kg (114 lb), last  menstrual period 07/25/2013.  Physical Exam  Constitutional: She is oriented to person, place, and time. She appears well-developed and well-nourished. No distress.  Cardiovascular: Normal rate.   Respiratory: Effort normal.  GI: Soft. Bowel sounds are normal. She exhibits no distension. There is no tenderness. There is no rebound and no guarding.  Genitourinary: Uterus normal. Vaginal discharge (white discharge) found.  Cervix long and closed Uterus small,nontender   Musculoskeletal: Normal range of motion.  Neurological: She is alert and oriented to person, place, and time.  Skin: Skin is warm and dry.  Psychiatric: She has a normal mood and affect.    MAU Course  Procedures  MDM Results for orders placed during the hospital encounter of 09/10/13 (from the past 72 hour(s))  URINALYSIS, ROUTINE W REFLEX MICROSCOPIC     Status: Abnormal   Collection Time    09/10/13 11:10 AM      Result Value Range   Color, Urine STRAW (*) YELLOW   APPearance CLEAR  CLEAR   Specific Gravity, Urine 1.010  1.005 - 1.030   pH 6.5  5.0 - 8.0   Glucose, UA NEGATIVE  NEGATIVE mg/dL   Hgb urine dipstick SMALL (*) NEGATIVE   Bilirubin Urine NEGATIVE  NEGATIVE   Ketones, ur NEGATIVE  NEGATIVE mg/dL   Protein, ur NEGATIVE  NEGATIVE mg/dL   Urobilinogen, UA 0.2  0.0 - 1.0 mg/dL   Nitrite NEGATIVE  NEGATIVE   Leukocytes, UA NEGATIVE  NEGATIVE  URINE MICROSCOPIC-ADD ON     Status: Abnormal   Collection Time    09/10/13 11:10 AM      Result Value Range   Squamous Epithelial / LPF FEW (*) RARE   WBC, UA 0-2  <3 WBC/hpf   RBC / HPF 0-2  <3 RBC/hpf   Urine-Other TRICHOMONAS PRESENT    POCT PREGNANCY, URINE     Status: Abnormal   Collection Time    09/10/13 11:13 AM      Result Value Range   Preg Test, Ur POSITIVE (*) NEGATIVE   Comment:            THE SENSITIVITY OF THIS     METHODOLOGY IS >24 mIU/mL  GC/CHLAMYDIA PROBE AMP     Status: None   Collection Time    09/10/13 12:04 PM      Result  Value Range   CT Probe RNA NEGATIVE  NEGATIVE   GC Probe RNA NEGATIVE  NEGATIVE   Comment: (NOTE)                                                                                               **Normal Reference Range: Negative**          Assay performed using the Gen-Probe APTIMA COMBO2 (R) Assay.     Acceptable specimen types for this assay include APTIMA Swabs (Unisex,     endocervical, urethral, or vaginal), first void urine, and ThinPrep     liquid based cytology samples.     Performed at [facility]  WET PREP, GENITAL     Status: Abnormal   Collection Time    09/10/13 12:04 PM      Result Value Range   Yeast Wet Prep HPF POC NONE SEEN  NONE SEEN   Trich, Wet Prep MODERATE (*) NONE SEEN   Clue Cells Wet Prep HPF POC FEW (*) NONE SEEN   WBC, Wet Prep HPF POC FEW (*) NONE SEEN   Comment: BACTERIA- TOO NUMEROUS TO COUNT  CBC     Status: Abnormal   Collection Time    09/10/13 12:47 PM      Result Value Range   WBC 7.6  4.0 - 10.5 K/uL   RBC 3.62 (*) 3.87 - 5.11 MIL/uL   Hemoglobin 11.5 (*) 12.0 - 15.0 g/dL   HCT 16.1 (*) 09.6 - 04.5 %   MCV 90.9  78.0 - 100.0 fL   MCH 31.8  26.0 - 34.0 pg   MCHC 35.0  30.0 - 36.0 g/dL   RDW 40.9  81.1 - 91.4 %   Platelets 195  150 - 400 K/uL  HCG, QUANTITATIVE, PREGNANCY     Status: Abnormal   Collection Time    09/10/13 12:47 PM      Result Value Range   hCG, Beta Chain, Quant, S 51451 (*) <5 mIU/mL   Comment:  GEST. AGE      CONC.  (mIU/mL)       <=1 WEEK        5 - 50         2 WEEKS       50 - 500         3 WEEKS       100 - 10,000         4 WEEKS     1,000 - 30,000         5 WEEKS     3,500 - 115,000       6-8 WEEKS     12,000 - 270,000        12 WEEKS     15,000 - 220,000                FEMALE AND NON-PREGNANT FEMALE:         LESS THAN 5 mIU/mL   US Ob Transvaginal  09/10/2013   CLINICAL DATA:  Early pregnancy.  Abdominal pain.  EXAM: OBSTETRIC <14 WK [place] AND TRANSVAGINAL OB US  TECHNIQUE: Both  transabdominal and transvaginal ultrasound examinations were performed for complete evaluation of the gestation as well as the maternal uterus, adnexal regions, and pelvic cul-de-sac. Transvaginal technique was performed to assess early pregnancy.  COMPARISON:  None.  FINDINGS: Intrauterine gestational sac: Visualized/normal in shape.  Yolk sac:  Visualized  Embryo:  Visualized  Cardiac Activity: Visualized  Heart Rate:  129 bpm  MSD:    mm    w     d  CRL:   7.3  mm   6 w 5 d                    [place] EDC: 05/01/2013  Maternal uterus/adnexae: No subchorionic hemorrhage. Ovaries are symmetric in size. Left corpus luteum cyst. No adnexal masses. Small amount of free fluid in the pelvis.    IMPRESSION: Six week 5 day intrauterine pregnancy with fetal heart rate 129 beats per min. Small amount of free fluid in the pelvis. No subchorionic hemorrhage.     Electronically Signed   By: Charlett Nose M.D.   On: 09/10/2013 13:40    Assessment and Plan  A:  SIUP at 6.5weeks by [place]      Trichomonas infection       BV  P:  Discussed findings       Discussed Trich with pt and partner (with her consent)      Rx Flagyl for 7 days due to BV      Start prenatal care  [facility] 09/10/2013, 12:00 PM

## 2013-09-10 NOTE — MAU Note (Signed)
+   HPT. Abdominal pain X 2 days, intermittent. Denies V/D, has some nausea. No vaginal D/C or bleeding.

## 2013-09-11 LAB — GC/CHLAMYDIA PROBE AMP: GC Probe RNA: NEGATIVE

## 2014-01-21 ENCOUNTER — Inpatient Hospital Stay (HOSPITAL_COMMUNITY): Payer: Medicaid Other

## 2014-01-21 ENCOUNTER — Inpatient Hospital Stay (HOSPITAL_COMMUNITY)
Admission: AD | Admit: 2014-01-21 | Discharge: 2014-01-21 | Disposition: A | Discharge status: Home / Self Care | Payer: Medicaid Other | Source: Ambulatory Visit | Attending: Obstetrics & Gynecology | Admitting: Obstetrics & Gynecology

## 2014-01-21 ENCOUNTER — Encounter (HOSPITAL_COMMUNITY): Payer: Self-pay | Admitting: *Deleted

## 2014-01-21 DIAGNOSIS — O99330 Smoking (tobacco) complicating pregnancy, unspecified trimester: Secondary | ICD-10-CM | POA: Insufficient documentation

## 2014-01-21 DIAGNOSIS — N760 Acute vaginitis: Secondary | ICD-10-CM | POA: Insufficient documentation

## 2014-01-21 DIAGNOSIS — O2390 Unspecified genitourinary tract infection in pregnancy, unspecified trimester: Secondary | ICD-10-CM | POA: Insufficient documentation

## 2014-01-21 DIAGNOSIS — Z8751 Personal history of pre-term labor: Secondary | ICD-10-CM

## 2014-01-21 DIAGNOSIS — O4700 False labor before 37 completed weeks of gestation, unspecified trimester: Secondary | ICD-10-CM | POA: Insufficient documentation

## 2014-01-21 DIAGNOSIS — B9689 Other specified bacterial agents as the cause of diseases classified elsewhere: Secondary | ICD-10-CM | POA: Insufficient documentation

## 2014-01-21 DIAGNOSIS — A499 Bacterial infection, unspecified: Secondary | ICD-10-CM | POA: Insufficient documentation

## 2014-01-21 LAB — HEPATITIS B SURFACE ANTIGEN: Hepatitis B Surface Ag: NEGATIVE

## 2014-01-21 LAB — URINALYSIS, ROUTINE W REFLEX MICROSCOPIC
Bilirubin Urine: NEGATIVE
GLUCOSE, UA: 100 mg/dL — AB
HGB URINE DIPSTICK: NEGATIVE
Ketones, ur: NEGATIVE mg/dL
Nitrite: NEGATIVE
Protein, ur: NEGATIVE mg/dL
Specific Gravity, Urine: 1.02 (ref 1.005–1.030)
Urobilinogen, UA: 0.2 mg/dL (ref 0.0–1.0)
pH: 6.5 (ref 5.0–8.0)

## 2014-01-21 LAB — TYPE AND SCREEN
ABO/RH(D): O POS
Antibody Screen: NEGATIVE

## 2014-01-21 LAB — DIFFERENTIAL
BASOS PCT: 0 % (ref 0–1)
Basophils Absolute: 0 10*3/uL (ref 0.0–0.1)
EOS ABS: 0 10*3/uL (ref 0.0–0.7)
Eosinophils Relative: 0 % (ref 0–5)
Lymphocytes Relative: 18 % (ref 12–46)
Lymphs Abs: 1.5 10*3/uL (ref 0.7–4.0)
MONO ABS: 0.5 10*3/uL (ref 0.1–1.0)
MONOS PCT: 6 % (ref 3–12)
Neutro Abs: 6.5 10*3/uL (ref 1.7–7.7)
Neutrophils Relative %: 76 % (ref 43–77)

## 2014-01-21 LAB — CBC
HEMATOCRIT: 30.3 % — AB (ref 36.0–46.0)
HEMOGLOBIN: 10.4 g/dL — AB (ref 12.0–15.0)
MCH: 32 pg (ref 26.0–34.0)
MCHC: 34.3 g/dL (ref 30.0–36.0)
MCV: 93.2 fL (ref 78.0–100.0)
Platelets: 207 10*3/uL (ref 150–400)
RBC: 3.25 MIL/uL — ABNORMAL LOW (ref 3.87–5.11)
RDW: 12.7 % (ref 11.5–15.5)
WBC: 8.6 10*3/uL (ref 4.0–10.5)

## 2014-01-21 LAB — RPR

## 2014-01-21 LAB — URINE MICROSCOPIC-ADD ON

## 2014-01-21 LAB — OB RESULTS CONSOLE GC/CHLAMYDIA
CHLAMYDIA, DNA PROBE: NEGATIVE
GC PROBE AMP, GENITAL: NEGATIVE

## 2014-01-21 LAB — WET PREP, GENITAL
Trich, Wet Prep: NONE SEEN
YEAST WET PREP: NONE SEEN

## 2014-01-21 LAB — HIV ANTIBODY (ROUTINE TESTING W REFLEX): HIV: NONREACTIVE

## 2014-01-21 LAB — FETAL FIBRONECTIN: Fetal Fibronectin: NEGATIVE

## 2014-01-21 MED ORDER — METRONIDAZOLE 500 MG PO TABS: 500.0000 mg | ORAL_TABLET | Freq: Two times a day (BID) | ORAL | Status: DC

## 2014-01-21 MED ORDER — PRENATAL VITAMINS 28-0.8 MG PO TABS: ORAL_TABLET | ORAL | Status: DC

## 2014-01-21 MED ORDER — ASPIRIN 81 MG PO TABS: 81.0000 mg | ORAL_TABLET | Freq: Every day | ORAL | Status: DC

## 2014-01-21 NOTE — Progress Notes (Signed)
Dr Gillian Scarce in to see pt

## 2014-01-21 NOTE — Progress Notes (Signed)
Pt unaware of ctxs. Has occ pressure

## 2014-01-21 NOTE — Discharge Instructions (Signed)
If you have not been informed of your scheduled prenatal visit in one week. please call the telephone number below.    [facility] [place] [address] Telephone: [phone] Preterm Labor Information Preterm labor is when labor starts at less than 37 weeks of pregnancy. The normal length of a pregnancy is 39 to 41 weeks. CAUSES Often, there is no identifiable underlying cause as to why a woman goes into preterm labor. One of the most common known causes of preterm labor is infection. Infections of the uterus, cervix, vagina, amniotic sac, bladder, kidney, or even the lungs (pneumonia) can cause labor to start. Other suspected causes of preterm labor include:   Urogenital infections, such as yeast infections and bacterial vaginosis.   Uterine abnormalities (uterine shape, uterine septum, fibroids, or bleeding from the placenta).   A cervix that has been operated on (it may fail to stay closed).   Malformations in the fetus.   Multiple gestations (twins, triplets, and so on).   Breakage of the amniotic sac.  RISK FACTORS  Having a previous history of preterm labor.   Having premature rupture of membranes (PROM).   Having a placenta that covers the opening of the cervix (placenta previa).   Having a placenta that separates from the uterus (placental abruption).   Having a cervix that is too weak to hold the fetus in the uterus (incompetent cervix).   Having too much fluid in the amniotic sac (polyhydramnios).   Taking illegal drugs or smoking while pregnant.   Not gaining enough weight while pregnant.   Being younger than 94 and older than 33 years old.   Having a low socioeconomic status.   Being African American. SYMPTOMS Signs and symptoms of preterm labor include:   Menstrual-like cramps, abdominal pain, or back pain.  Uterine contractions that are regular, as frequent as six in an hour, regardless of their intensity  (may be mild or painful).  Contractions that start on the top of the uterus and spread down to the lower abdomen and back.   A sense of increased pelvic pressure.   A watery or bloody mucus discharge that comes from the vagina.  TREATMENT Depending on the length of the pregnancy and other circumstances, your health care provider may suggest bed rest. If necessary, there are medicines that can be given to stop contractions and to mature the fetal lungs. If labor happens before 34 weeks of pregnancy, a prolonged hospital stay may be recommended. Treatment depends on the condition of both you and the fetus.  WHAT SHOULD YOU DO IF YOU THINK YOU ARE IN PRETERM LABOR? Call your health care provider right away. You will need to go to the hospital to get checked immediately. HOW CAN YOU PREVENT PRETERM LABOR IN FUTURE PREGNANCIES? You should:   Stop smoking if you smoke.  Maintain healthy weight gain and avoid chemicals and drugs that are not necessary.  Be watchful for any type of infection.  Inform your health care provider if you have a known history of preterm labor. Document Released: 11/19/2003 Document Revised: 05/01/2013 Document Reviewed: 10/01/2012 [facility] Patient Information 2014 [place], [place].   Braxton Hicks Contractions Pregnancy is commonly associated with contractions of the uterus throughout the pregnancy. Towards the end of pregnancy (32 to 34 weeks), these contractions [facility] Ishmael Holter) can develop more often and may become more forceful. This is not true labor because these contractions do not result in opening (dilatation) and thinning of the cervix. They  are sometimes difficult to tell apart from true labor because these contractions can be forceful and people have different pain tolerances. You should not feel embarrassed if you go to the hospital with false labor. Sometimes, the only way to tell if you are in true labor is for your caregiver to follow the changes in the  cervix. How to tell the difference between true and false labor:  False labor.  The contractions of false labor are usually shorter, irregular and not as hard as those of true labor.  They are often felt in the front of the lower abdomen and in the groin.  They may leave with walking around or changing positions while lying down.  They get weaker and are shorter lasting as time goes on.  These contractions are usually irregular.  They do not usually become progressively stronger, regular and closer together as with true labor.  True labor.  Contractions in true labor last 30 to 70 seconds, become very regular, usually become more intense, and increase in frequency.  They do not go away with walking.  The discomfort is usually felt in the top of the uterus and spreads to the lower abdomen and low back.  True labor can be determined by your caregiver with an exam. This will show that the cervix is dilating and getting thinner. If there are no prenatal problems or other health problems associated with the pregnancy, it is completely safe to be sent home with false labor and await the onset of true labor. HOME CARE INSTRUCTIONS   Keep up with your usual exercises and instructions.  Take medications as directed.  Keep your regular prenatal appointment.  Eat and drink lightly if you think you are going into labor.  If BH contractions are making you uncomfortable:  Change your activity position from lying down or resting to walking/walking to resting.  Sit and rest in a tub of warm water.  Drink 2 to 3 glasses of water. Dehydration may cause B-H contractions.  Do slow and deep breathing several times an hour. SEEK IMMEDIATE MEDICAL CARE IF:   Your contractions continue to become stronger, more regular, and closer together.  You have a gushing, burst or leaking of fluid from the vagina.  An oral temperature above 102 F (38.9 C) develops.  You have passage of  blood-tinged mucus.  You develop vaginal bleeding.  You develop continuous belly (abdominal) pain.  You have low back pain that you never had before.  You feel the baby's head pushing down causing pelvic pressure.  The baby is not moving as much as it used to. Document Released: 08/29/2005 Document Revised: 11/21/2011 Document Reviewed: 06/10/2013 [facility] Patient Information 2014 [place], [place].

## 2014-01-21 NOTE — MAU Note (Signed)
Wants to get prenatal care and see if baby OK.  She was not referred on last visit

## 2014-01-21 NOTE — Progress Notes (Signed)
Dr Leslie Andrea in to see pt

## 2014-01-21 NOTE — MAU Provider Note (Signed)
First Provider Initiated Contact with Patient 01/21/14 1425      Chief Complaint:  No chief complaint on file.   Marissa Meyer is  33 y.o. [id number] at [redacted]w[redacted]d by first trimester [place] presents to make sure her baby is okay. Pt reports positive fetal movement.  No vaginal bleeding, LOF or vaginal discharge.    H/o GHTN in previous pregnancies with Preterm SVD. Also has h/o cerclage. Has not had prenatal care this pregnancy.     Obstetrical/Gynecological History: OB History   Grav Para Term Preterm Abortions TAB SAB Ect Mult Living   4 2 0 2 1   1  2        Past Medical History: Past Medical History  Diagnosis Date  . Hypertension   . Chlamydia   . Gonorrhea   . Trichomonas   . Migraines     but not diagnosed  . Ectopic pregnancy   . Preterm delivery     Past Surgical History: Past Surgical History  Procedure Laterality Date  . Dilation and curettage of uterus    . Ectopic pregnancy surgery      Family History: Family History  Problem Relation Age of Onset  . Hypertension Mother   . Hypertension Maternal Grandmother   . Cancer Maternal Grandfather     Social History: History  Substance Use Topics  . Smoking status: Current Every Day Smoker -- 0.25 packs/day    Types: Cigarettes  . Smokeless tobacco: Never Used  . Alcohol Use: Yes     Comment: occ    Allergies:  Allergies  Allergen Reactions  . Amoxicillin Hives    Meds:  No prescriptions prior to admission    Review of Systems -   Review of Systems  Constitutional: No malaise, fever or chills HEENT: Occasional headaches, none currently, no changes in vision CV: no chest pain Resp: no dyspnea Ab: no abdominal pain or ctx Ext: no edema  Physical Exam  Blood pressure 130/86, pulse 110, temperature 99 F (37.2 C), resp. rate 18, height 4\' 9"  (1.448 m), weight 58.741 kg (129 lb 8 oz), last menstrual period 07/25/2013, SpO2 100.00%. GENERAL: Well-developed, well-nourished female in no acute  distress.  LUNGS: Clear to auscultation bilaterally.  HEART: Regular rate and rhythm. ABDOMEN: Soft, nontender, nondistended, gravid.  EXTREMITIES: Nontender, no edema, 2+ distal pulses. SPECULUM EXAM: No blood in the vault, no LOF, copious discharge CERVICAL EXAM: closed/thick/midposition Presentation:vertex by leopold FHT:  Baseline  145, 10x10accels, No decels, Category I tracing Contractions: Irregular   Labs: Results for orders placed during the hospital encounter of 01/21/14 (from the past 24 hour(s))  URINALYSIS, ROUTINE W REFLEX MICROSCOPIC     Status: Abnormal   Collection Time    01/21/14  1:57 PM      Result Value Ref Range   Color, Urine YELLOW  YELLOW   APPearance HAZY (*) CLEAR   Specific Gravity, Urine 1.020  1.005 - 1.030   pH 6.5  5.0 - 8.0   Glucose, UA 100 (*) NEGATIVE mg/dL   Hgb urine dipstick NEGATIVE  NEGATIVE   Bilirubin Urine NEGATIVE  NEGATIVE   Ketones, ur NEGATIVE  NEGATIVE mg/dL   Protein, ur NEGATIVE  NEGATIVE mg/dL   Urobilinogen, UA 0.2  0.0 - 1.0 mg/dL   Nitrite NEGATIVE  NEGATIVE   Leukocytes, UA TRACE (*) NEGATIVE  URINE MICROSCOPIC-ADD ON     Status: Abnormal   Collection Time    01/21/14  1:57 PM  Result Value Ref Range   Squamous Epithelial / LPF MANY (*) RARE   WBC, UA 0-2  <3 WBC/hpf  CBC     Status: Abnormal   Collection Time    01/21/14  2:48 PM      Result Value Ref Range   WBC 8.6  4.0 - 10.5 K/uL   RBC 3.25 (*) 3.87 - 5.11 MIL/uL   Hemoglobin 10.4 (*) 12.0 - 15.0 g/dL   HCT 30.3 (*) 36.0 - 46.0 %   MCV 93.2  78.0 - 100.0 fL   MCH 32.0  26.0 - 34.0 pg   MCHC 34.3  30.0 - 36.0 g/dL   RDW 12.7  11.5 - 15.5 %   Platelets 207  150 - 400 K/uL  DIFFERENTIAL     Status: None   Collection Time    01/21/14  2:48 PM      Result Value Ref Range   Neutrophils Relative % 76  43 - 77 %   Neutro Abs 6.5  1.7 - 7.7 K/uL   Lymphocytes Relative 18  12 - 46 %   Lymphs Abs 1.5  0.7 - 4.0 K/uL   Monocytes Relative 6  3 - 12 %    Monocytes Absolute 0.5  0.1 - 1.0 K/uL   Eosinophils Relative 0  0 - 5 %   Eosinophils Absolute 0.0  0.0 - 0.7 K/uL   Basophils Relative 0  0 - 1 %   Basophils Absolute 0.0  0.0 - 0.1 K/uL  TYPE AND SCREEN     Status: None   Collection Time    01/21/14  2:48 PM      Result Value Ref Range   ABO/RH(D) O POS     Antibody Screen NEG     Sample Expiration 01/24/2014    FETAL FIBRONECTIN     Status: None   Collection Time    01/21/14  3:40 PM      Result Value Ref Range   Fetal Fibronectin NEGATIVE  NEGATIVE  WET PREP, GENITAL     Status: Abnormal   Collection Time    01/21/14  3:40 PM      Result Value Ref Range   Yeast Wet Prep HPF POC NONE SEEN  NONE SEEN   Trich, Wet Prep NONE SEEN  NONE SEEN   Clue Cells Wet Prep HPF POC FEW (*) NONE SEEN   WBC, Wet Prep HPF POC FEW (*) NONE SEEN     Imaging Studies:  Limited US [redacted]w[redacted]d, Cervical length 3.62cm  Assessment: Marissa Meyer is  33 y.o. [id number] at [redacted]w[redacted]d with h/o GHTN in prior pregnancies without prenatal care here for fetal wellbeing and needs to establish care.   - FWB: Category I tracing, irregular ctx - BP stable, no neurological symptoms - Fetal Fibronectin negative, cervix closed, Cervical length 3.62 - Bacterial Vaginosis   Plan: 1. Prenatal labs drawn 2. Message sent to [facility] to establish prenatal care 3. Complete US with cervical length ordered as outpatient 4. Metronidazole 500mg  BID x 7 days  Clarisa Schools 5/12/20152:42 PM  I spoke with and examined patient and agree with resident's note and plan of care.  Fredrik Rigger, MD OB Fellow 01/21/2014 8:52 PM

## 2014-01-22 LAB — GC/CHLAMYDIA PROBE AMP
CT PROBE, AMP APTIMA: NEGATIVE
GC PROBE AMP APTIMA: NEGATIVE

## 2014-01-22 LAB — RUBELLA SCREEN: RUBELLA: 2.75 {index} — AB (ref <0.90)

## 2014-01-23 NOTE — MAU Provider Note (Signed)
Attestation of Attending Supervision of Obstetric Fellow: Evaluation and management procedures were performed by the Obstetric Fellow under my supervision and collaboration.  I have reviewed the Obstetric Fellow's note and chart, and I agree with the management and plan.  Jaivon Vanbeek, MD, FACOG Attending Obstetrician & Gynecologist Faculty Practice, [facility]   

## 2014-01-30 ENCOUNTER — Other Ambulatory Visit (HOSPITAL_COMMUNITY): Payer: Self-pay | Admitting: Family Medicine

## 2014-01-30 ENCOUNTER — Ambulatory Visit (HOSPITAL_COMMUNITY)
Admission: RE | Admit: 2014-01-30 | Discharge: 2014-01-30 | Disposition: A | Discharge status: Home / Self Care | Payer: Medicaid Other | Source: Ambulatory Visit | Attending: Family Medicine | Admitting: Family Medicine

## 2014-01-30 DIAGNOSIS — Z8751 Personal history of pre-term labor: Secondary | ICD-10-CM

## 2014-01-30 DIAGNOSIS — O26879 Cervical shortening, unspecified trimester: Secondary | ICD-10-CM | POA: Insufficient documentation

## 2014-02-20 ENCOUNTER — Encounter: Payer: Medicaid Other | Admitting: Obstetrics & Gynecology

## 2014-02-20 DIAGNOSIS — O10019 Pre-existing essential hypertension complicating pregnancy, unspecified trimester: Secondary | ICD-10-CM | POA: Insufficient documentation

## 2014-02-20 DIAGNOSIS — O0930 Supervision of pregnancy with insufficient antenatal care, unspecified trimester: Secondary | ICD-10-CM | POA: Insufficient documentation

## 2014-02-20 DIAGNOSIS — O99330 Smoking (tobacco) complicating pregnancy, unspecified trimester: Secondary | ICD-10-CM

## 2014-02-20 DIAGNOSIS — O09219 Supervision of pregnancy with history of pre-term labor, unspecified trimester: Secondary | ICD-10-CM | POA: Insufficient documentation

## 2014-02-20 DIAGNOSIS — Z8751 Personal history of pre-term labor: Secondary | ICD-10-CM | POA: Insufficient documentation

## 2014-02-20 DIAGNOSIS — I10 Essential (primary) hypertension: Secondary | ICD-10-CM | POA: Insufficient documentation

## 2014-02-20 HISTORY — DX: Smoking (tobacco) complicating pregnancy, unspecified trimester: O99.330

## 2014-02-20 NOTE — Patient Instructions (Signed)
Return to clinic for any obstetric concerns or go to MAU for evaluation Hypertension During Pregnancy Hypertension is also called high blood pressure. It can occur at any time in life and during pregnancy. When you have hypertension, there is extra pressure inside your blood vessels that carry blood from the heart to the rest of your body (arteries). Hypertension during pregnancy can cause problems for you and your baby. Your baby might not weigh as much as it should at birth or might be born early (premature). Very bad cases of hypertension during pregnancy can be life threatening.  Different types of hypertension can occur during pregnancy.   Chronic hypertension. This happens when a woman has hypertension before pregnancy and it continues during pregnancy.  Gestational hypertension. This is when hypertension develops during pregnancy.  Preeclampsia or toxemia of pregnancy. This is a very serious type of hypertension that develops only during pregnancy. It is a disease that affects the whole body (systemic) and can be very dangerous for both mother and baby.  Gestational hypertension and preeclampsia usually go away after your baby is born. Blood pressure generally stabilizes within 6 weeks. Women who have hypertension during pregnancy have a greater chance of developing hypertension later in life or with future pregnancies. RISK FACTORS Some factors make you more likely to develop hypertension during pregnancy. Risk factors include:  Having hypertension before pregnancy.  Having hypertension during a previous pregnancy.  Being overweight.  Being older than 60.  Being pregnant with more than one baby (multiples).  Having diabetes or kidney problems. SIGNS AND SYMPTOMS Chronic and gestational hypertension rarely cause symptoms. Preeclampsia has symptoms, which may include:  Increased protein in your urine. Your health care provider will check for this at every prenatal  visit.  Swelling of your hands and face.  Rapid weight gain.  Headaches.  Visual changes.  Being bothered by light.  Abdominal pain, especially in the right upper area.  Chest pain.  Shortness of breath.  Increased reflexes.  Seizures. Seizures occur with a more severe form of preeclampsia, called eclampsia. DIAGNOSIS   You may be diagnosed with hypertension during a regular prenatal exam. At each visit, tests may include:  Blood pressure checks.  A urine test to check for protein in your urine.  The type of hypertension you are diagnosed with depends on when you developed it. It also depends on your specific blood pressure reading.  Developing hypertension before 20 weeks of pregnancy is consistent with chronic hypertension.  Developing hypertension after 20 weeks of pregnancy is consistent with gestational hypertension.  Hypertension with increased urinary protein is diagnosed as preeclampsia.  Blood pressure measurements that stay above 474 systolic or 259 diastolic are a sign of severe preeclampsia. TREATMENT Treatment for hypertension during pregnancy varies. Treatment depends on the type of hypertension and how serious it is.  If you take medicine for chronic hypertension, you may need to switch medicines.  Drugs called ACE inhibitors should not be taken during pregnancy.  Low-dose aspirin may be suggested for women who have risk factors for preeclampsia.  If you have gestational hypertension, you may need to take a blood pressure medicine that is safe during pregnancy. Your health care provider will recommend the appropriate medicine.  If you have severe preeclampsia, you may need to be in the hospital. Health care providers will watch you and the baby very closely. You also may need to take medicine (magnesium sulfate) to prevent seizures and lower blood pressure.  Sometimes an early delivery  is needed. This may be the case if the condition worsens. It would  be done to protect you and the baby. The only cure for preeclampsia is delivery. HOME CARE INSTRUCTIONS  Schedule and keep all of your regular appointments for prenatal care.  Only take over-the-counter or prescription medicines as directed by your health care provider. Tell your health care provider about all medicines you take.  Eat as little salt as possible.  Get regular exercise.  Do not drink alcohol.  Do not use tobacco products.  Do not drink products with caffeine.  Lie on your left side when resting. SEEK IMMEDIATE MEDICAL CARE IF:  You have severe abdominal pain.  You have sudden swelling in the hands, ankles, or face.  You gain 4 pounds (1.8 kg) or more in 1 week.  You vomit repeatedly.  You have vaginal bleeding.  You do not feel the baby moving as much.  You have a headache.  You have blurred or double vision.  You have muscle twitching or spasms.  You have shortness of breath.  You have blue fingernails and lips.  You have blood in your urine. MAKE SURE YOU:  Understand these instructions.  Will watch your condition.  Will get help right away if you are not doing well or get worse. Document Released: 05/17/2011 Document Revised: 06/19/2013 Document Reviewed: 03/28/2013 [facility] Patient Information 2014 [place], [place].

## 2014-02-21 NOTE — Progress Notes (Signed)
This encounter was created in error - please disregard.

## 2014-03-13 ENCOUNTER — Other Ambulatory Visit (HOSPITAL_COMMUNITY)
Admission: RE | Admit: 2014-03-13 | Discharge: 2014-03-13 | Disposition: A | Discharge status: Home / Self Care | Payer: Medicaid Other | Source: Ambulatory Visit | Attending: Obstetrics & Gynecology | Admitting: Obstetrics & Gynecology

## 2014-03-13 ENCOUNTER — Ambulatory Visit (INDEPENDENT_AMBULATORY_CARE_PROVIDER_SITE_OTHER): Payer: Medicaid Other | Admitting: Obstetrics and Gynecology

## 2014-03-13 ENCOUNTER — Inpatient Hospital Stay (HOSPITAL_COMMUNITY): Payer: Medicaid Other

## 2014-03-13 ENCOUNTER — Observation Stay (HOSPITAL_COMMUNITY)
Admission: AD | Admit: 2014-03-13 | Discharge: 2014-03-15 | Disposition: A | Discharge status: Home / Self Care | Payer: Medicaid Other | Source: Ambulatory Visit | Attending: Obstetrics & Gynecology | Admitting: Obstetrics & Gynecology

## 2014-03-13 ENCOUNTER — Encounter (HOSPITAL_COMMUNITY): Payer: Self-pay | Admitting: Obstetrics and Gynecology

## 2014-03-13 ENCOUNTER — Encounter: Payer: Self-pay | Admitting: Obstetrics and Gynecology

## 2014-03-13 VITALS — BP 162/90 | HR 86 | Wt 135.7 lb

## 2014-03-13 DIAGNOSIS — O10019 Pre-existing essential hypertension complicating pregnancy, unspecified trimester: Secondary | ICD-10-CM | POA: Diagnosis not present

## 2014-03-13 DIAGNOSIS — F1410 Cocaine abuse, uncomplicated: Secondary | ICD-10-CM

## 2014-03-13 DIAGNOSIS — O99333 Smoking (tobacco) complicating pregnancy, third trimester: Secondary | ICD-10-CM

## 2014-03-13 DIAGNOSIS — O368990 Maternal care for other specified fetal problems, unspecified trimester, not applicable or unspecified: Secondary | ICD-10-CM

## 2014-03-13 DIAGNOSIS — O99330 Smoking (tobacco) complicating pregnancy, unspecified trimester: Secondary | ICD-10-CM

## 2014-03-13 DIAGNOSIS — O43899 Other placental disorders, unspecified trimester: Secondary | ICD-10-CM

## 2014-03-13 DIAGNOSIS — Z113 Encounter for screening for infections with a predominantly sexual mode of transmission: Secondary | ICD-10-CM

## 2014-03-13 DIAGNOSIS — O99340 Other mental disorders complicating pregnancy, unspecified trimester: Secondary | ICD-10-CM | POA: Insufficient documentation

## 2014-03-13 DIAGNOSIS — Z01419 Encounter for gynecological examination (general) (routine) without abnormal findings: Secondary | ICD-10-CM | POA: Insufficient documentation

## 2014-03-13 DIAGNOSIS — Z1151 Encounter for screening for human papillomavirus (HPV): Secondary | ICD-10-CM

## 2014-03-13 DIAGNOSIS — I10 Essential (primary) hypertension: Secondary | ICD-10-CM | POA: Diagnosis present

## 2014-03-13 DIAGNOSIS — IMO0002 Reserved for concepts with insufficient information to code with codable children: Secondary | ICD-10-CM

## 2014-03-13 DIAGNOSIS — O0930 Supervision of pregnancy with insufficient antenatal care, unspecified trimester: Secondary | ICD-10-CM

## 2014-03-13 DIAGNOSIS — O99323 Drug use complicating pregnancy, third trimester: Secondary | ICD-10-CM

## 2014-03-13 DIAGNOSIS — O09213 Supervision of pregnancy with history of pre-term labor, third trimester: Secondary | ICD-10-CM

## 2014-03-13 DIAGNOSIS — O09219 Supervision of pregnancy with history of pre-term labor, unspecified trimester: Secondary | ICD-10-CM

## 2014-03-13 DIAGNOSIS — O0933 Supervision of pregnancy with insufficient antenatal care, third trimester: Secondary | ICD-10-CM | POA: Insufficient documentation

## 2014-03-13 DIAGNOSIS — O163 Unspecified maternal hypertension, third trimester: Secondary | ICD-10-CM

## 2014-03-13 HISTORY — DX: Benign neoplasm of connective and other soft tissue, unspecified: D21.9

## 2014-03-13 LAB — POCT URINALYSIS DIP (DEVICE)
Bilirubin Urine: NEGATIVE
GLUCOSE, UA: NEGATIVE mg/dL
Hgb urine dipstick: NEGATIVE
KETONES UR: NEGATIVE mg/dL
Nitrite: NEGATIVE
Protein, ur: 30 mg/dL — AB
SPECIFIC GRAVITY, URINE: 1.025 (ref 1.005–1.030)
Urobilinogen, UA: 1 mg/dL (ref 0.0–1.0)
pH: 6.5 (ref 5.0–8.0)

## 2014-03-13 LAB — COMPREHENSIVE METABOLIC PANEL
ALBUMIN: 2.6 g/dL — AB (ref 3.5–5.2)
ALT: 11 U/L (ref 0–35)
ANION GAP: 10 (ref 5–15)
AST: 13 U/L (ref 0–37)
Alkaline Phosphatase: 108 U/L (ref 39–117)
BUN: 4 mg/dL — AB (ref 6–23)
CALCIUM: 8.7 mg/dL (ref 8.4–10.5)
CO2: 22 mEq/L (ref 19–32)
CREATININE: 0.7 mg/dL (ref 0.50–1.10)
Chloride: 104 mEq/L (ref 96–112)
GFR calc Af Amer: >90 mL/min (ref >90)
GFR calc non Af Amer: >90 mL/min (ref >90)
Glucose, Bld: 113 mg/dL — ABNORMAL HIGH (ref 70–99)
Potassium: 3.6 mEq/L — ABNORMAL LOW (ref 3.7–5.3)
Sodium: 136 mEq/L — ABNORMAL LOW (ref 137–147)
TOTAL PROTEIN: 6.1 g/dL (ref 6.0–8.3)
Total Bilirubin: <0.2 mg/dL — ABNORMAL LOW (ref 0.3–1.2)

## 2014-03-13 LAB — URINALYSIS, ROUTINE W REFLEX MICROSCOPIC
Bilirubin Urine: NEGATIVE
GLUCOSE, UA: 250 mg/dL — AB
Hgb urine dipstick: NEGATIVE
KETONES UR: NEGATIVE mg/dL
LEUKOCYTES UA: NEGATIVE
Nitrite: NEGATIVE
PROTEIN: NEGATIVE mg/dL
Specific Gravity, Urine: 1.02 (ref 1.005–1.030)
Urobilinogen, UA: 0.2 mg/dL (ref 0.0–1.0)
pH: 6 (ref 5.0–8.0)

## 2014-03-13 LAB — CBC
HCT: 31.8 % — ABNORMAL LOW (ref 36.0–46.0)
Hemoglobin: 10.4 g/dL — ABNORMAL LOW (ref 12.0–15.0)
MCH: 29.6 pg (ref 26.0–34.0)
MCHC: 32.7 g/dL (ref 30.0–36.0)
MCV: 90.6 fL (ref 78.0–100.0)
Platelets: 183 10*3/uL (ref 150–400)
RBC: 3.51 MIL/uL — AB (ref 3.87–5.11)
RDW: 12.9 % (ref 11.5–15.5)
WBC: 8.7 10*3/uL (ref 4.0–10.5)

## 2014-03-13 LAB — TYPE AND SCREEN
ABO/RH(D): O POS
Antibody Screen: NEGATIVE

## 2014-03-13 LAB — RAPID URINE DRUG SCREEN, HOSP PERFORMED
AMPHETAMINES: NOT DETECTED
BARBITURATES: NOT DETECTED
BENZODIAZEPINES: NOT DETECTED
Cocaine: POSITIVE — AB
Opiates: NOT DETECTED
Tetrahydrocannabinol: NOT DETECTED

## 2014-03-13 LAB — OB RESULTS CONSOLE GBS: GBS: NEGATIVE

## 2014-03-13 LAB — PROTEIN / CREATININE RATIO, URINE
CREATININE, URINE: 175.83 mg/dL
Protein Creatinine Ratio: 0.08 (ref 0.00–0.15)
Total Protein, Urine: 14.1 mg/dL

## 2014-03-13 MED ORDER — PRENATAL MULTIVITAMIN CH
1.0000 | ORAL_TABLET | Freq: Every day | ORAL | Status: DC
  Administered 2014-03-13 – 2014-03-15 (×3): 1 via ORAL
  Filled 2014-03-13 (×3): qty 1

## 2014-03-13 MED ORDER — LABETALOL HCL 100 MG PO TABS
100.0000 mg | ORAL_TABLET | Freq: Two times a day (BID) | ORAL | Status: DC
  Administered 2014-03-13 – 2014-03-15 (×5): 100 mg via ORAL
  Filled 2014-03-13 (×5): qty 1

## 2014-03-13 MED ORDER — BETAMETHASONE SOD PHOS & ACET 6 (3-3) MG/ML IJ SUSP
12.0000 mg | INTRAMUSCULAR | Status: AC
  Administered 2014-03-13 – 2014-03-14 (×2): 12 mg via INTRAMUSCULAR
  Filled 2014-03-13 (×2): qty 2

## 2014-03-13 MED ORDER — ZOLPIDEM TARTRATE 5 MG PO TABS: 5.0000 mg | ORAL_TABLET | Freq: Every evening | ORAL | Status: DC | PRN

## 2014-03-13 MED ORDER — FAMOTIDINE 20 MG PO TABS
20.0000 mg | ORAL_TABLET | Freq: Two times a day (BID) | ORAL | Status: DC
  Administered 2014-03-13 – 2014-03-15 (×4): 20 mg via ORAL
  Filled 2014-03-13 (×4): qty 1

## 2014-03-13 MED ORDER — ACETAMINOPHEN 325 MG PO TABS
650.0000 mg | ORAL_TABLET | ORAL | Status: DC | PRN
  Administered 2014-03-14: 650 mg via ORAL
  Filled 2014-03-13: qty 2

## 2014-03-13 MED ORDER — DOCUSATE SODIUM 100 MG PO CAPS
100.0000 mg | ORAL_CAPSULE | Freq: Every day | ORAL | Status: DC
  Administered 2014-03-14: 100 mg via ORAL
  Filled 2014-03-13: qty 1

## 2014-03-13 MED ORDER — CALCIUM CARBONATE ANTACID 500 MG PO CHEW: 2.0000 | CHEWABLE_TABLET | ORAL | Status: DC | PRN

## 2014-03-13 NOTE — Progress Notes (Signed)
   Subjective:    Marissa Meyer is a [id number] [redacted]w[redacted]d being seen today for her first obstetrical visit.  Her obstetrical history is significant for smoker and late onset of care, CHTN, history of 2 previous births. Patient reports PTL with first pregnancy and IOL secondary to preeclampsia with second pregnancy. Patient is CHTN and has not been on medications for a long time and admits that she should have been. She denies HA, visual disturbances, RUQ/epigastric pain. Patient does intend to breast feed. Pregnancy history fully reviewed.  Patient reports no complaints.  Filed Vitals:   03/13/14 1018 03/13/14 1024  BP: 172/100 162/90  Pulse: 86   Weight: 135 lb 11.2 oz (61.553 kg)     HISTORY: OB History  Gravida Para Term Preterm AB SAB TAB Ectopic Multiple Living  4 2 0 2 1   1  2     # Outcome Date GA Lbr Len/2nd Weight Sex Delivery Anes PTL Lv  4 CUR           3 PRE      SVD     2 ECT           1 PRE     F SVD   Y     Past Medical History  Diagnosis Date  . Hypertension   . Chlamydia   . Gonorrhea   . Trichomonas   . Migraines     but not diagnosed  . Ectopic pregnancy   . Preterm delivery    Past Surgical History  Procedure Laterality Date  . Dilation and curettage of uterus    . Ectopic pregnancy surgery     Family History  Problem Relation Age of Onset  . Hypertension Mother   . Hypertension Maternal Grandmother   . Cancer Maternal Grandfather      Exam    Uterus:     Pelvic Exam:    Perineum: No Hemorrhoids, Normal Perineum   Vulva: normal   Vagina:  normal mucosa, normal discharge   pH:    Cervix: 1/thick/post   Adnexa: not evaluated   Bony Pelvis: android  System: Breast:  normal appearance, no masses or tenderness   Skin: normal coloration and turgor, no rashes    Neurologic: oriented, no focal deficits   Extremities: normal strength, tone, and muscle mass   HEENT extra ocular movement intact   Mouth/Teeth mucous membranes moist, pharynx normal  without lesions and dental hygiene good   Neck supple and no masses   Cardiovascular: regular rate and rhythm   Respiratory:  chest clear, no wheezing, crepitations, rhonchi, normal symmetric air entry   Abdomen: soft, gravid   Urinary:       Assessment:    Pregnancy: [id number] Patient Active Problem List   Diagnosis Date Noted  . Insufficient prenatal care in third trimester 03/13/2014  . Chronic hypertension, antepartum 02/20/2014  . Late prenatal care starting at 30 weeks 02/20/2014  . History of preterm labor  02/20/2014  . Tobacco use in pregnancy, antepartum 02/20/2014  . Hypertension         Plan:     Initial labs drawn. Prenatal vitamins. Problem list reviewed and updated. Genetic Screening discussed : too late.  Ultrasound discussed; fetal survey: results reviewed. F/U growth ultrasound will be ordered  patient with elevated BP- will admit for 24 hr collection and observation 50% of 30 min visit spent on counseling and coordination of care.     Marissa Meyer 03/13/2014

## 2014-03-13 NOTE — Patient Instructions (Signed)
Third Trimester of Pregnancy The third trimester is from week 29 through week 42, months 7 through 9. The third trimester is a time when the fetus is growing rapidly. At the end of the ninth month, the fetus is about 20 inches in length and weighs 6-10 pounds.  BODY CHANGES Your body goes through many changes during pregnancy. The changes vary from woman to woman.   Your weight will continue to increase. You can expect to gain 25-35 pounds (11-16 kg) by the end of the pregnancy.  You may begin to get stretch marks on your hips, abdomen, and breasts.  You may urinate more often because the fetus is moving lower into your pelvis and pressing on your bladder.  You may develop or continue to have heartburn as a result of your pregnancy.  You may develop constipation because certain hormones are causing the muscles that push waste through your intestines to slow down.  You may develop hemorrhoids or swollen, bulging veins (varicose veins).  You may have pelvic pain because of the weight gain and pregnancy hormones relaxing your joints between the bones in your pelvis. Backaches may result from overexertion of the muscles supporting your posture.  You may have changes in your hair. These can include thickening of your hair, rapid growth, and changes in texture. Some women also have hair loss during or after pregnancy, or hair that feels dry or thin. Your hair will most likely return to normal after your baby is born.  Your breasts will continue to grow and be tender. A yellow discharge may leak from your breasts called colostrum.  Your belly button may stick out.  You may feel short of breath because of your expanding uterus.  You may notice the fetus "dropping," or moving lower in your abdomen.  You may have a bloody mucus discharge. This usually occurs a few days to a week before labor begins.  Your cervix becomes thin and soft (effaced) near your due date. WHAT TO EXPECT AT YOUR  PRENATAL EXAMS  You will have prenatal exams every 2 weeks until week 36. Then, you will have weekly prenatal exams. During a routine prenatal visit:  You will be weighed to make sure you and the fetus are growing normally.  Your blood pressure is taken.  Your abdomen will be measured to track your baby's growth.  The fetal heartbeat will be listened to.  Any test results from the previous visit will be discussed.  You may have a cervical check near your due date to see if you have effaced. At around 36 weeks, your caregiver will check your cervix. At the same time, your caregiver will also perform a test on the secretions of the vaginal tissue. This test is to determine if a type of bacteria, Group B streptococcus, is present. Your caregiver will explain this further. Your caregiver may ask you:  What your birth plan is.  How you are feeling.  If you are feeling the baby move.  If you have had any abnormal symptoms, such as leaking fluid, bleeding, severe headaches, or abdominal cramping.  If you have any questions. Other tests or screenings that may be performed during your third trimester include:  Blood tests that check for low iron levels (anemia).  Fetal testing to check the health, activity level, and growth of the fetus. Testing is done if you have certain medical conditions or if there are problems during the pregnancy. FALSE LABOR You may feel small, irregular contractions that   eventually go away. These are called Braxton Hicks contractions, or false labor. Contractions may last for hours, days, or even weeks before true labor sets in. If contractions come at regular intervals, intensify, or become painful, it is best to be seen by your caregiver.  SIGNS OF LABOR   Menstrual-like cramps.  Contractions that are 5 minutes apart or less.  Contractions that start on the top of the uterus and spread down to the lower abdomen and back.  A sense of increased pelvic  pressure or back pain.  A watery or bloody mucus discharge that comes from the vagina. If you have any of these signs before the 37th week of pregnancy, call your caregiver right away. You need to go to the hospital to get checked immediately. HOME CARE INSTRUCTIONS   Avoid all smoking, herbs, alcohol, and unprescribed drugs. These chemicals affect the formation and growth of the baby.  Follow your caregiver's instructions regarding medicine use. There are medicines that are either safe or unsafe to take during pregnancy.  Exercise only as directed by your caregiver. Experiencing uterine cramps is a good sign to stop exercising.  Continue to eat regular, healthy meals.  Wear a good support bra for breast tenderness.  Do not use hot tubs, steam rooms, or saunas.  Wear your seat belt at all times when driving.  Avoid raw meat, uncooked cheese, cat litter boxes, and soil used by cats. These carry germs that can cause birth defects in the baby.  Take your prenatal vitamins.  Try taking a stool softener (if your caregiver approves) if you develop constipation. Eat more high-fiber foods, such as fresh vegetables or fruit and whole grains. Drink plenty of fluids to keep your urine clear or pale yellow.  Take warm sitz baths to soothe any pain or discomfort caused by hemorrhoids. Use hemorrhoid cream if your caregiver approves.  If you develop varicose veins, wear support hose. Elevate your feet for 15 minutes, 3-4 times a day. Limit salt in your diet.  Avoid heavy lifting, wear low heal shoes, and practice good posture.  Rest a lot with your legs elevated if you have leg cramps or low back pain.  Visit your dentist if you have not gone during your pregnancy. Use a soft toothbrush to brush your teeth and be gentle when you floss.  A sexual relationship may be continued unless your caregiver directs you otherwise.  Do not travel far distances unless it is absolutely necessary and only  with the approval of your caregiver.  Take prenatal classes to understand, practice, and ask questions about the labor and delivery.  Make a trial run to the hospital.  Pack your hospital bag.  Prepare the baby's nursery.  Continue to go to all your prenatal visits as directed by your caregiver. SEEK MEDICAL CARE IF:  You are unsure if you are in labor or if your water has broken.  You have dizziness.  You have mild pelvic cramps, pelvic pressure, or nagging pain in your abdominal area.  You have persistent nausea, vomiting, or diarrhea.  You have a bad smelling vaginal discharge.  You have pain with urination. SEEK IMMEDIATE MEDICAL CARE IF:   You have a fever.  You are leaking fluid from your vagina.  You have spotting or bleeding from your vagina.  You have severe abdominal cramping or pain.  You have rapid weight loss or gain.  You have shortness of breath with chest pain.  You notice sudden or extreme swelling   of your face, hands, ankles, feet, or legs.  You have not felt your baby move in over an hour.  You have severe headaches that do not go away with medicine.  You have vision changes. Document Released: 08/23/2001 Document Revised: 09/03/2013 Document Reviewed: 10/30/2012 [employer] Patient Information 2015 [employer]. This information is not intended to replace advice given to you by your health care provider. Make sure you discuss any questions you have with your health care provider.  Contraception Choices Contraception (birth control) is the use of any methods or devices to prevent pregnancy. Below are some methods to help avoid pregnancy. HORMONAL METHODS   Contraceptive implant. This is a thin, plastic tube containing progesterone hormone. It does not contain estrogen hormone. Your health care provider inserts the tube in the inner part of the upper arm. The tube can remain in place for up to 3 years. After 3 years, the implant must be removed.  The implant prevents the ovaries from releasing an egg (ovulation), thickens the cervical mucus to prevent sperm from entering the uterus, and thins the lining of the inside of the uterus.  Progesterone-only injections. These injections are given every 3 months by your health care provider to prevent pregnancy. This synthetic progesterone hormone stops the ovaries from releasing eggs. It also thickens cervical mucus and changes the uterine lining. This makes it harder for sperm to survive in the uterus.  Birth control pills. These pills contain estrogen and progesterone hormone. They work by preventing the ovaries from releasing eggs (ovulation). They also cause the cervical mucus to thicken, preventing the sperm from entering the uterus. Birth control pills are prescribed by a health care provider.Birth control pills can also be used to treat heavy periods.  Minipill. This type of birth control pill contains only the progesterone hormone. They are taken every day of each month and must be prescribed by your health care provider.  Birth control patch. The patch contains hormones similar to those in birth control pills. It must be changed once a week and is prescribed by a health care provider.  Vaginal ring. The ring contains hormones similar to those in birth control pills. It is left in the vagina for 3 weeks, removed for 1 week, and then a new one is put back in place. The patient must be comfortable inserting and removing the ring from the vagina.A health care provider's prescription is necessary.  Emergency contraception. Emergency contraceptives prevent pregnancy after unprotected sexual intercourse. This pill can be taken right after sex or up to 5 days after unprotected sex. It is most effective the sooner you take the pills after having sexual intercourse. Most emergency contraceptive pills are available without a prescription. Check with your pharmacist. Do not use emergency contraception as  your only form of birth control. BARRIER METHODS   Female condom. This is a thin sheath (latex or rubber) that is worn over the penis during sexual intercourse. It can be used with spermicide to increase effectiveness.  Female condom. This is a soft, loose-fitting sheath that is put into the vagina before sexual intercourse.  Diaphragm. This is a soft, latex, dome-shaped barrier that must be fitted by a health care provider. It is inserted into the vagina, along with a spermicidal jelly. It is inserted before intercourse. The diaphragm should be left in the vagina for 6 to 8 hours after intercourse.  Cervical cap. This is a round, soft, latex or plastic cup that fits over the cervix and must be   fitted by a health care provider. The cap can be left in place for up to 48 hours after intercourse.  Sponge. This is a soft, circular piece of polyurethane foam. The sponge has spermicide in it. It is inserted into the vagina after wetting it and before sexual intercourse.  Spermicides. These are chemicals that kill or block sperm from entering the cervix and uterus. They come in the form of creams, jellies, suppositories, foam, or tablets. They do not require a prescription. They are inserted into the vagina with an applicator before having sexual intercourse. The process must be repeated every time you have sexual intercourse. INTRAUTERINE CONTRACEPTION  Intrauterine device (IUD). This is a T-shaped device that is put in a woman's uterus during a menstrual period to prevent pregnancy. There are 2 types:  Copper IUD. This type of IUD is wrapped in copper wire and is placed inside the uterus. Copper makes the uterus and fallopian tubes produce a fluid that kills sperm. It can stay in place for 10 years.  Hormone IUD. This type of IUD contains the hormone progestin (synthetic progesterone). The hormone thickens the cervical mucus and prevents sperm from entering the uterus, and it also thins the uterine  lining to prevent implantation of a fertilized egg. The hormone can weaken or kill the sperm that get into the uterus. It can stay in place for 3-5 years, depending on which type of IUD is used. PERMANENT METHODS OF CONTRACEPTION  Female tubal ligation. This is when the woman's fallopian tubes are surgically sealed, tied, or blocked to prevent the egg from traveling to the uterus.  Hysteroscopic sterilization. This involves placing a small coil or insert into each fallopian tube. Your doctor uses a technique called hysteroscopy to do the procedure. The device causes scar tissue to form. This results in permanent blockage of the fallopian tubes, so the sperm cannot fertilize the egg. It takes about 3 months after the procedure for the tubes to become blocked. You must use another form of birth control for these 3 months.  Female sterilization. This is when the female has the tubes that carry sperm tied off (vasectomy).This blocks sperm from entering the vagina during sexual intercourse. After the procedure, the man can still ejaculate fluid (semen). NATURAL PLANNING METHODS  Natural family planning. This is not having sexual intercourse or using a barrier method (condom, diaphragm, cervical cap) on days the woman could become pregnant.  Calendar method. This is keeping track of the length of each menstrual cycle and identifying when you are fertile.  Ovulation method. This is avoiding sexual intercourse during ovulation.  Symptothermal method. This is avoiding sexual intercourse during ovulation, using a thermometer and ovulation symptoms.  Post-ovulation method. This is timing sexual intercourse after you have ovulated. Regardless of which type or method of contraception you choose, it is important that you use condoms to protect against the transmission of sexually transmitted infections (STIs). Talk with your health care provider about which form of contraception is most appropriate for  you. Document Released: 08/29/2005 Document Revised: 09/03/2013 Document Reviewed: 02/21/2013 [employer] Patient Information 2015 [employer]. This information is not intended to replace advice given to you by your health care provider. Make sure you discuss any questions you have with your health care provider.  Breastfeeding Deciding to breastfeed is one of the best choices you can make for you and your baby. A change in hormones during pregnancy causes your breast tissue to grow and increases the number and size of   your milk ducts. These hormones also allow proteins, sugars, and fats from your blood supply to make breast milk in your milk-producing glands. Hormones prevent breast milk from being released before your baby is born as well as prompt milk flow after birth. Once breastfeeding has begun, thoughts of your baby, as well as his or her sucking or crying, can stimulate the release of milk from your milk-producing glands.  BENEFITS OF BREASTFEEDING For Your Baby  Your first milk (colostrum) helps your baby's digestive system function better.   There are antibodies in your milk that help your baby fight off infections.   Your baby has a lower incidence of asthma, allergies, and sudden infant death syndrome.   The nutrients in breast milk are better for your baby than infant formulas and are designed uniquely for your baby's needs.   Breast milk improves your baby's brain development.   Your baby is less likely to develop other conditions, such as childhood obesity, asthma, or type 2 diabetes mellitus.  For You   Breastfeeding helps to create a very special bond between you and your baby.   Breastfeeding is convenient. Breast milk is always available at the correct temperature and costs nothing.   Breastfeeding helps to burn calories and helps you lose the weight gained during pregnancy.   Breastfeeding makes your uterus contract to its prepregnancy size faster and slows  bleeding (lochia) after you give birth.   Breastfeeding helps to lower your risk of developing type 2 diabetes mellitus, osteoporosis, and breast or ovarian cancer later in life. SIGNS THAT YOUR BABY IS HUNGRY Early Signs of Hunger  Increased alertness or activity.  Stretching.  Movement of the head from side to side.  Movement of the head and opening of the mouth when the corner of the mouth or cheek is stroked (rooting).  Increased sucking sounds, smacking lips, cooing, sighing, or squeaking.  Hand-to-mouth movements.  Increased sucking of fingers or hands. Late Signs of Hunger  Fussing.  Intermittent crying. Extreme Signs of Hunger Signs of extreme hunger will require calming and consoling before your baby will be able to breastfeed successfully. Do not wait for the following signs of extreme hunger to occur before you initiate breastfeeding:   Restlessness.  A loud, strong cry.   Screaming. BREASTFEEDING BASICS Breastfeeding Initiation  Find a comfortable place to sit or lie down, with your neck and back well supported.  Place a pillow or rolled up blanket under your baby to bring him or her to the level of your breast (if you are seated). Nursing pillows are specially designed to help support your arms and your baby while you breastfeed.  Make sure that your baby's abdomen is facing your abdomen.   Gently massage your breast. With your fingertips, massage from your chest wall toward your nipple in a circular motion. This encourages milk flow. You may need to continue this action during the feeding if your milk flows slowly.  Support your breast with 4 fingers underneath and your thumb above your nipple. Make sure your fingers are well away from your nipple and your baby's mouth.   Stroke your baby's lips gently with your finger or nipple.   When your baby's mouth is open wide enough, quickly bring your baby to your breast, placing your entire nipple and as  much of the colored area around your nipple (areola) as possible into your baby's mouth.   More areola should be visible above your baby's upper lip than below   the lower lip.   Your baby's tongue should be between his or her lower gum and your breast.   Ensure that your baby's mouth is correctly positioned around your nipple (latched). Your baby's lips should create a seal on your breast and be turned out (everted).  It is common for your baby to suck about 2-3 minutes in order to start the flow of breast milk. Latching Teaching your baby how to latch on to your breast properly is very important. An improper latch can cause nipple pain and decreased milk supply for you and poor weight gain in your baby. Also, if your baby is not latched onto your nipple properly, he or she may swallow some air during feeding. This can make your baby fussy. Burping your baby when you switch breasts during the feeding can help to get rid of the air. However, teaching your baby to latch on properly is still the best way to prevent fussiness from swallowing air while breastfeeding. Signs that your baby has successfully latched on to your nipple:    Silent tugging or silent sucking, without causing you pain.   Swallowing heard between every 3-4 sucks.    Muscle movement above and in front of his or her ears while sucking.  Signs that your baby has not successfully latched on to nipple:   Sucking sounds or smacking sounds from your baby while breastfeeding.  Nipple pain. If you think your baby has not latched on correctly, slip your finger into the corner of your baby's mouth to break the suction and place it between your baby's gums. Attempt breastfeeding initiation again. Signs of Successful Breastfeeding Signs from your baby:   A gradual decrease in the number of sucks or complete cessation of sucking.   Falling asleep.   Relaxation of his or her body.   Retention of a small amount of milk in  his or her mouth.   Letting go of your breast by himself or herself. Signs from you:  Breasts that have increased in firmness, weight, and size 1-3 hours after feeding.   Breasts that are softer immediately after breastfeeding.  Increased milk volume, as well as a change in milk consistency and color by the fifth day of breastfeeding.   Nipples that are not sore, cracked, or bleeding. Signs That Your Baby is Getting Enough Milk  Wetting at least 3 diapers in a 24-hour period. The urine should be clear and pale yellow by age 5 days.  At least 3 stools in a 24-hour period by age 5 days. The stool should be soft and yellow.  At least 3 stools in a 24-hour period by age 7 days. The stool should be seedy and yellow.  No loss of weight greater than 10% of birth weight during the first 3 days of age.  Average weight gain of 4-7 ounces (113-198 g) per week after age 4 days.  Consistent daily weight gain by age 5 days, without weight loss after the age of 2 weeks. After a feeding, your baby may spit up a small amount. This is common. BREASTFEEDING FREQUENCY AND DURATION Frequent feeding will help you make more milk and can prevent sore nipples and breast engorgement. Breastfeed when you feel the need to reduce the fullness of your breasts or when your baby shows signs of hunger. This is called "breastfeeding on demand." Avoid introducing a pacifier to your baby while you are working to establish breastfeeding (the first 4-6 weeks after your baby is born).   After this time you may choose to use a pacifier. Research has shown that pacifier use during the first year of a baby's life decreases the risk of sudden infant death syndrome (SIDS). Allow your baby to feed on each breast as long as he or she wants. Breastfeed until your baby is finished feeding. When your baby unlatches or falls asleep while feeding from the first breast, offer the second breast. Because newborns are often sleepy in the  first few weeks of life, you may need to awaken your baby to get him or her to feed. Breastfeeding times will vary from baby to baby. However, the following rules can serve as a guide to help you ensure that your baby is properly fed:  Newborns (babies 4 weeks of age or younger) may breastfeed every 1-3 hours.  Newborns should not go longer than 3 hours during the day or 5 hours during the night without breastfeeding.  You should breastfeed your baby a minimum of 8 times in a 24-hour period until you begin to introduce solid foods to your baby at around 6 months of age. BREAST MILK PUMPING Pumping and storing breast milk allows you to ensure that your baby is exclusively fed your breast milk, even at times when you are unable to breastfeed. This is especially important if you are going back to work while you are still breastfeeding or when you are not able to be present during feedings. Your lactation consultant can give you guidelines on how long it is safe to store breast milk.  A breast pump is a machine that allows you to pump milk from your breast into a sterile bottle. The pumped breast milk can then be stored in a refrigerator or freezer. Some breast pumps are operated by hand, while others use electricity. Ask your lactation consultant which type will work best for you. Breast pumps can be purchased, but some hospitals and breastfeeding support groups lease breast pumps on a monthly basis. A lactation consultant can teach you how to hand express breast milk, if you prefer not to use a pump.  CARING FOR YOUR BREASTS WHILE YOU BREASTFEED Nipples can become dry, cracked, and sore while breastfeeding. The following recommendations can help keep your breasts moisturized and healthy:  Avoid using soap on your nipples.   Wear a supportive bra. Although not required, special nursing bras and tank tops are designed to allow access to your breasts for breastfeeding without taking off your entire bra  or top. Avoid wearing underwire-style bras or extremely tight bras.  Air dry your nipples for 3-4minutes after each feeding.   Use only cotton bra pads to absorb leaked breast milk. Leaking of breast milk between feedings is normal.   Use lanolin on your nipples after breastfeeding. Lanolin helps to maintain your skin's normal moisture barrier. If you use pure lanolin, you do not need to wash it off before feeding your baby again. Pure lanolin is not toxic to your baby. You may also hand express a few drops of breast milk and gently massage that milk into your nipples and allow the milk to air dry. In the first few weeks after giving birth, some women experience extremely full breasts (engorgement). Engorgement can make your breasts feel heavy, warm, and tender to the touch. Engorgement peaks within 3-5 days after you give birth. The following recommendations can help ease engorgement:  Completely empty your breasts while breastfeeding or pumping. You may want to start by applying warm, moist heat (in   the shower or with warm water-soaked hand towels) just before feeding or pumping. This increases circulation and helps the milk flow. If your baby does not completely empty your breasts while breastfeeding, pump any extra milk after he or she is finished.  Wear a snug bra (nursing or regular) or tank top for 1-2 days to signal your body to slightly decrease milk production.  Apply ice packs to your breasts, unless this is too uncomfortable for you.  Make sure that your baby is latched on and positioned properly while breastfeeding. If engorgement persists after 48 hours of following these recommendations, contact your health care provider or a lactation consultant. OVERALL HEALTH CARE RECOMMENDATIONS WHILE BREASTFEEDING  Eat healthy foods. Alternate between meals and snacks, eating 3 of each per day. Because what you eat affects your breast milk, some of the foods may make your baby more irritable  than usual. Avoid eating these foods if you are sure that they are negatively affecting your baby.  Drink milk, fruit juice, and water to satisfy your thirst (about 10 glasses a day).   Rest often, relax, and continue to take your prenatal vitamins to prevent fatigue, stress, and anemia.  Continue breast self-awareness checks.  Avoid chewing and smoking tobacco.  Avoid alcohol and drug use. Some medicines that may be harmful to your baby can pass through breast milk. It is important to ask your health care provider before taking any medicine, including all over-the-counter and prescription medicine as well as vitamin and herbal supplements. It is possible to become pregnant while breastfeeding. If birth control is desired, ask your health care provider about options that will be safe for your baby. SEEK MEDICAL CARE IF:   You feel like you want to stop breastfeeding or have become frustrated with breastfeeding.  You have painful breasts or nipples.  Your nipples are cracked or bleeding.  Your breasts are red, tender, or warm.  You have a swollen area on either breast.  You have a fever or chills.  You have nausea or vomiting.  You have drainage other than breast milk from your nipples.  Your breasts do not become full before feedings by the fifth day after you give birth.  You feel sad and depressed.  Your baby is too sleepy to eat well.  Your baby is having trouble sleeping.   Your baby is wetting less than 3 diapers in a 24-hour period.  Your baby has less than 3 stools in a 24-hour period.  Your baby's skin or the white part of his or her eyes becomes yellow.   Your baby is not gaining weight by 5 days of age. SEEK IMMEDIATE MEDICAL CARE IF:   Your baby is overly tired (lethargic) and does not want to wake up and feed.  Your baby develops an unexplained fever. Document Released: 08/29/2005 Document Revised: 09/03/2013 Document Reviewed: 02/20/2013 Exit[employer]  Patient Information 2015 [employer]. This information is not intended to replace advice given to you by your health care provider. Make sure you discuss any questions you have with your health care provider.  

## 2014-03-13 NOTE — H&P (Signed)
Subjective:   Marissa Meyer is a [id number] [redacted]w[redacted]d being seen today for her first obstetrical visit. Her obstetrical history is significant for smoker and late onset of care, CHTN, history of 2 previous births. Patient reports PTL with first pregnancy and IOL secondary to preeclampsia with second pregnancy. Patient is CHTN and has not been on medications for a long time and admits that she should have been. She denies HA, visual disturbances, RUQ/epigastric pain. Patient does intend to breast feed. Pregnancy history fully reviewed.  Patient reports no complaints.  Filed Vitals:    03/13/14 1018  03/13/14 1024   BP:  172/100  162/90   Pulse:  86    Weight:  135 lb 11.2 oz (61.553 kg)    HISTORY:  OB History   Gravida  Para  Term  Preterm  AB  SAB  TAB  Ectopic  Multiple  Living   4  2  0  2  1    1   2      #  Outcome  Date  GA  Lbr Len/2nd  Weight  Sex  Delivery  Anes  PTL  Lv   4  CUR            3  PRE       SVD      2  ECT            1  PRE      F  SVD    Y      Past Medical History   Diagnosis  Date   .  Hypertension    .  Chlamydia    .  Gonorrhea    .  Trichomonas    .  Migraines      but not diagnosed   .  Ectopic pregnancy    .  Preterm delivery     Past Surgical History   Procedure  Laterality  Date   .  Dilation and curettage of uterus     .  Ectopic pregnancy surgery      Family History   Problem  Relation  Age of Onset   .  Hypertension  Mother    .  Hypertension  Maternal Grandmother    .  Cancer  Maternal Grandfather     Filed Vitals:   03/13/14 1308  BP: 168/91  Pulse: 79  Temp:   Resp: 18    Exam    Uterus:    Pelvic Exam:     Perineum:  No Hemorrhoids, Normal Perineum    Vulva:  normal    Vagina:  normal mucosa, normal discharge    pH:     Cervix:  1/thick/post    Adnexa:  not evaluated    Bony Pelvis:  android   System:  Breast:  normal appearance, no masses or tenderness    Skin:  normal coloration and turgor, no rashes    Neurologic:   oriented, no focal deficits    Extremities:  normal strength, tone, and muscle mass    HEENT  extra ocular movement intact    Mouth/Teeth  mucous membranes moist, pharynx normal without lesions and dental hygiene good    Neck  supple and no masses    Cardiovascular:  regular rate and rhythm    Respiratory:  chest clear, no wheezing, crepitations, rhonchi, normal symmetric air entry    Abdomen:  soft, gravid    Urinary:     Assessment:   Pregnancy:  [id number]  Patient Active Problem List    Diagnosis  Date Noted   .  Insufficient prenatal care in third trimester  03/13/2014   .  Chronic hypertension, antepartum  02/20/2014   .  Late prenatal care starting at 30 weeks  02/20/2014   .  History of preterm labor  02/20/2014   .  Tobacco use in pregnancy, antepartum  02/20/2014   .  Hypertension     Plan:    Genetic Screening discussed : too late.  Ultrasound discussed; fetal survey: results reviewed. F/U growth ultrasound will be ordered  patient with elevated BP- will admit for 24 hr collection and observation   Labetalol 100 mg BID   Woodroe Mode, MD  03/13/2014 4:54 PM

## 2014-03-14 ENCOUNTER — Encounter: Payer: Self-pay | Admitting: Obstetrics and Gynecology

## 2014-03-14 DIAGNOSIS — F1410 Cocaine abuse, uncomplicated: Secondary | ICD-10-CM | POA: Diagnosis present

## 2014-03-14 DIAGNOSIS — O10019 Pre-existing essential hypertension complicating pregnancy, unspecified trimester: Principal | ICD-10-CM

## 2014-03-14 DIAGNOSIS — O99323 Drug use complicating pregnancy, third trimester: Secondary | ICD-10-CM

## 2014-03-14 DIAGNOSIS — O99320 Drug use complicating pregnancy, unspecified trimester: Secondary | ICD-10-CM

## 2014-03-14 DIAGNOSIS — F1920 Other psychoactive substance dependence, uncomplicated: Secondary | ICD-10-CM

## 2014-03-14 HISTORY — DX: Drug use complicating pregnancy, unspecified trimester: O99.320

## 2014-03-14 HISTORY — DX: Drug use complicating pregnancy, unspecified trimester: F14.10

## 2014-03-14 LAB — CREATININE CLEARANCE, URINE, 24 HOUR
COLLECTION INTERVAL-CRCL: 24 h
CREAT CLEAR: 80 mL/min (ref 75–115)
CREATININE 24H UR: 1270 mg/d (ref 700–1800)
CREATININE, URINE: 92.33 mg/dL
CREATININE: 1.1 mg/dL (ref 0.50–1.10)
URINE TOTAL VOLUME-CRCL: 1375 mL

## 2014-03-14 LAB — CBC
HCT: 30.4 % — ABNORMAL LOW (ref 36.0–46.0)
Hemoglobin: 10.1 g/dL — ABNORMAL LOW (ref 12.0–15.0)
MCH: 29.6 pg (ref 26.0–34.0)
MCHC: 33.2 g/dL (ref 30.0–36.0)
MCV: 89.1 fL (ref 78.0–100.0)
Platelets: 189 10*3/uL (ref 150–400)
RBC: 3.41 MIL/uL — ABNORMAL LOW (ref 3.87–5.11)
RDW: 13.5 % (ref 11.5–15.5)
WBC: 8.7 10*3/uL (ref 4.0–10.5)

## 2014-03-14 LAB — PROTEIN, URINE, 24 HOUR
Collection Interval-UPROT: 24 hours
Protein, 24H Urine: 83 mg/d (ref 50–100)
Protein, Urine: 6 mg/dL
Urine Total Volume-UPROT: 1375 mL

## 2014-03-14 LAB — HIV ANTIBODY (ROUTINE TESTING W REFLEX): HIV 1&2 Ab, 4th Generation: NONREACTIVE

## 2014-03-14 LAB — RPR

## 2014-03-14 LAB — GLUCOSE TOLERANCE, 1 HOUR (50G) W/O FASTING: GLUCOSE 1 HOUR GTT: 98 mg/dL (ref 70–140)

## 2014-03-14 MED ORDER — LACTATED RINGERS IV SOLN
INTRAVENOUS | Status: DC
  Administered 2014-03-14: 19:00:00 via INTRAVENOUS

## 2014-03-14 NOTE — Progress Notes (Signed)
Social worker at bedside.
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
## 2014-03-14 NOTE — Progress Notes (Signed)
[facility] COMPREHENSIVE PROGRESS NOTE  Marissa Meyer is a 33 y.o. [phone] at 54w1dwho is admitted for severe range BP and new diagnosis of IUGR in the setting of known CHTN. She is undergoing evaluation for superimposed preeclampsia.  Of note, admission UDS also came back positive for cocaine.  Estimated Date of Delivery: 05/01/14  Fetal presentation is cephalic.  Length of Stay:  1 Days. Admitted 03/13/2014  Subjective: Patient admitted to recent cocaine use.  She was informed of risk factors associated with cocaine; IUGR, severe range BP, abruption etc. Also informed her that SW will be consulted. Denies any preeclampsia symptoms. Patient reports good fetal movement.  She reports rare uterine contractions, no bleeding and no loss of fluid per vagina.  Vitals:  Blood pressure 128/60, pulse 112, temperature 98.8 F (37.1 C), temperature source Oral, resp. rate 18, height 4' 9"  (1.448 m), weight 135 lb 11.2 oz (61.553 kg), last menstrual period 07/25/2013. Temp:  [98 F (36.7 C)-99.1 F (37.3 C)] 98.8 F (37.1 C) (07/03 0605) Pulse Rate:  [79-112] 112 (07/03 0605) Resp:  [18-20] 18 (07/03 0605) BP: (128-172)/(60-100) 128/60 mmHg (07/03 0605) Weight:  [135 lb 11.2 oz (61.553 kg)] 135 lb 11.2 oz (61.553 kg) (07/02 1246)  Physical Examination: General appearance: alert, cooperative and no distress  Abdomen: gravid, soft, non-tender  Extremities: No edema, DTR's 2+   Fetal monitoring: FHR: 155 bpm, Variability: moderate, Accelerations: Present, Decelerations: rare variable decels Uterine activity: 1-3 contractions per hour  Results for orders placed during the hospital encounter of 03/13/14 (from the past 48 hour(s))  URINALYSIS, ROUTINE W REFLEX MICROSCOPIC     Status: Abnormal   Collection Time    03/13/14 12:25 PM      Result Value Ref Range   Color, Urine YELLOW  YELLOW   APPearance HAZY (*) CLEAR   Specific Gravity, Urine 1.020  1.005 - 1.030   pH 6.0  5.0 -  8.0   Glucose, UA 250 (*) NEGATIVE mg/dL   Hgb urine dipstick NEGATIVE  NEGATIVE   Bilirubin Urine NEGATIVE  NEGATIVE   Ketones, ur NEGATIVE  NEGATIVE mg/dL   Protein, ur NEGATIVE  NEGATIVE mg/dL   Urobilinogen, UA 0.2  0.0 - 1.0 mg/dL   Nitrite NEGATIVE  NEGATIVE   Leukocytes, UA NEGATIVE  NEGATIVE   Comment: MICROSCOPIC NOT DONE ON URINES WITH NEGATIVE PROTEIN, BLOOD, LEUKOCYTES, NITRITE, OR GLUCOSE <1000 mg/dL.  URINE RAPID DRUG SCREEN (HOSP PERFORMED)     Status: Abnormal   Collection Time    03/13/14 12:25 PM      Result Value Ref Range   Opiates NONE DETECTED  NONE DETECTED   Cocaine POSITIVE (*) NONE DETECTED   Benzodiazepines NONE DETECTED  NONE DETECTED   Amphetamines NONE DETECTED  NONE DETECTED   Tetrahydrocannabinol NONE DETECTED  NONE DETECTED   Barbiturates NONE DETECTED  NONE DETECTED   Comment:            DRUG SCREEN FOR MEDICAL PURPOSES     ONLY.  IF CONFIRMATION IS NEEDED     FOR ANY PURPOSE, NOTIFY LAB     WITHIN 5 DAYS.                LOWEST DETECTABLE LIMITS     FOR URINE DRUG SCREEN     Drug Class       Cutoff (ng/mL)     Amphetamine      1000     Barbiturate  200     Benzodiazepine   626     Tricyclics       948     Opiates          300     Cocaine          300     THC              50     Performed at [facility] / CREATININE RATIO, URINE     Status: None   Collection Time    03/13/14 12:25 PM      Result Value Ref Range   Creatinine, Urine 175.83     Total Protein, Urine 14.1     Comment: NO NORMAL RANGE ESTABLISHED FOR THIS TEST   PROTEIN CREATININE RATIO 0.08  0.00 - 0.15  CBC     Status: Abnormal   Collection Time    03/13/14  2:00 PM      Result Value Ref Range   WBC 8.7  4.0 - 10.5 K/uL   RBC 3.51 (*) 3.87 - 5.11 MIL/uL   Hemoglobin 10.4 (*) 12.0 - 15.0 g/dL   HCT 31.8 (*) 36.0 - 46.0 %   MCV 90.6  78.0 - 100.0 fL   MCH 29.6  26.0 - 34.0 pg   MCHC 32.7  30.0 - 36.0 g/dL   RDW 12.9  11.5 - 15.5 %   Platelets 183   150 - 400 K/uL  TYPE AND SCREEN     Status: None   Collection Time    03/13/14  2:00 PM      Result Value Ref Range   ABO/RH(D) O POS     Antibody Screen NEG     Sample Expiration 03/16/2014    COMPREHENSIVE METABOLIC PANEL     Status: Abnormal   Collection Time    03/13/14  2:00 PM      Result Value Ref Range   Sodium 136 (*) 137 - 147 mEq/L   Potassium 3.6 (*) 3.7 - 5.3 mEq/L   Chloride 104  96 - 112 mEq/L   CO2 22  19 - 32 mEq/L   Glucose, Bld 113 (*) 70 - 99 mg/dL   BUN 4 (*) 6 - 23 mg/dL   Creatinine, Ser 0.70  0.50 - 1.10 mg/dL   Calcium 8.7  8.4 - 10.5 mg/dL   Total Protein 6.1  6.0 - 8.3 g/dL   Albumin 2.6 (*) 3.5 - 5.2 g/dL   AST 13  0 - 37 U/L   ALT 11  0 - 35 U/L   Alkaline Phosphatase 108  39 - 117 U/L   Total Bilirubin <0.2 (*) 0.3 - 1.2 mg/dL   GFR calc non Af Amer >90  >90 mL/min   GFR calc Af Amer >90  >90 mL/min   Comment: (NOTE)     The eGFR has been calculated using the CKD EPI equation.     This calculation has not been validated in all clinical situations.     eGFR's persistently <90 mL/min signify possible Chronic Kidney     Disease.   Anion gap 10  5 - 15   03/13/2014   OBSTETRICAL ULTRASOUND:  37w0dEFW 1370g (<10%), AFI 14.25 cm, BPP 8/8, cephalic, normal UA dopplers  Current scheduled medications . betamethasone acetate-betamethasone sodium phosphate  12 mg Intramuscular Q24 Hr x 2  . docusate sodium  100 mg Oral Daily  . famotidine  20 mg Oral  BID  . labetalol  100 mg Oral BID  . prenatal multivitamin  1 tablet Oral Q1200    I have reviewed the patient's current medications.  ASSESSMENT: Patient Active Problem List   Diagnosis Date Noted  . 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 Insufficient prenatal care in third trimester 03/13/2014  . Fetal growth restriction 03/13/2014  . Chronic hypertension, antepartum 02/20/2014  . Late prenatal care starting at 30 weeks 02/20/2014  . History of preterm labor   02/20/2014  . Tobacco use in pregnancy, antepartum 02/20/2014  . Hypertension     PLAN: Patient informed about positive cocaine drug screen; Social Work consulted Will await 24 hour urine results Will complete Betamethasone regimen at 1730 today Continue to watch BP closely; on Labetalol 100 mg po bid Continue routine antenatal care for now.   Verita Schneiders, MD, [place] Attending [place], [facility]

## 2014-03-14 NOTE — Progress Notes (Signed)
Orders given for LR.

## 2014-03-14 NOTE — Progress Notes (Signed)
Psychosocial assessment complete.  No barriers to discharge.  Full documentation to follow.

## 2014-03-15 DIAGNOSIS — O10019 Pre-existing essential hypertension complicating pregnancy, unspecified trimester: Secondary | ICD-10-CM | POA: Diagnosis not present

## 2014-03-15 DIAGNOSIS — I10 Essential (primary) hypertension: Secondary | ICD-10-CM

## 2014-03-15 DIAGNOSIS — O169 Unspecified maternal hypertension, unspecified trimester: Secondary | ICD-10-CM

## 2014-03-15 LAB — CULTURE, OB URINE

## 2014-03-15 LAB — CULTURE, BETA STREP (GROUP B ONLY)

## 2014-03-15 MED ORDER — LABETALOL HCL 100 MG PO TABS: 100.0000 mg | ORAL_TABLET | Freq: Two times a day (BID) | ORAL | Status: DC

## 2014-03-15 NOTE — Discharge Summary (Signed)
Physician Discharge Summary  Patient ID: Marissa Meyer MRN: [id number] DOB/AGE: 1981/07/01 33 y.o.  Admit date: 03/13/2014 Discharge date: 03/15/2014  Admission Diagnoses: Chronic hypertension rule out superimposed preeclampsia  Discharge Diagnoses: CHTN, IUGR, cocaine abuse Active Problems:   Chronic hypertension, antepartum   Hypertension   Fetal growth restriction   Cocaine abuse affecting pregnancy in third trimester   Discharged Condition: good  Hospital Course: Patient admitted following her initial prenatal visit at 33 weeks to rule out superimposed preeclampsia. Patient found to be positive for cocaine. BP improved following initiation of labetalol 100 BID. Ultrasound demonstrates fetal growth restriction. 24 hr urine collection and labs normal. Patient to continue close monitoring outpatient with twice weekly NST, weekly dopplers and AFI   Consults: None   Treatments: betamethasone and antihypertensive  Discharge Exam: Blood pressure 146/73, pulse 105, temperature 99.1 F (37.3 C), temperature source Oral, resp. rate 18, height 4\' 9"  (1.448 m), weight 137 lb 6.4 oz (62.324 kg), last menstrual period 07/25/2013. General appearance: alert, cooperative and no distress GI: soft, gravid, NT Extremities: Homans sign is negative, no sign of DVT NST: baseline 130, mod variability, +accels, no decels. Toco: no contractions Disposition: 01-Home or Self Care     Medication List         acetaminophen 325 MG tablet  Commonly known as:  TYLENOL  Take 650 mg by mouth every 6 (six) hours as needed for headache.     aspirin 81 MG tablet  Take 1 tablet (81 mg total) by mouth daily.     labetalol 100 MG tablet  Commonly known as:  NORMODYNE  Take 1 tablet (100 mg total) by mouth 2 (two) times daily.     prenatal multivitamin Tabs tablet  Take 1 tablet by mouth daily at 12 noon.       Follow-up Information   Follow up with [facility] (Keep 7/9 appointment. You  will be called for additional appointments on Monday for your ultrasound and fetal monitoring)    Specialty:  Obstetrics and Gynecology   Contact information:   [place] [address] [phone]      Signed: Kiyomi Pallo 03/15/2014, 7:23 AM

## 2014-03-15 NOTE — Discharge Instructions (Signed)
Hypertension During Pregnancy Hypertension is also called high blood pressure. Blood pressure moves blood in your body. Sometimes, the force that moves the blood becomes too strong. When you are pregnant, this condition should be watched carefully. It can cause problems for you and your baby. HOME CARE   Make and keep all of your doctor visits.  Take medicine as told by your doctor. Tell your doctor about all medicines you take.  Eat very little salt.  Exercise regularly.  Do not drink alcohol.  Do not smoke.  Do not have drinks with caffeine.  Lie on your left side when resting. GET HELP RIGHT AWAY IF:  You have bad belly (abdominal) pain.  You have sudden puffiness (swelling) in the hands, ankles, or face.  You gain 4 pounds (1.8 kilograms) or more in 1 week.  You throw up (vomit) repeatedly.  You have bleeding from the vagina.  You do not feel the baby moving as much.  You have a headache.  You have blurred or double vision.  You have muscle twitching or spasms.  You have shortness of breath.  You have blue fingernails and lips.  You have blood in your pee (urine). MAKE SURE YOU:  Understand these instructions.  Will watch your condition.  Will get help right away if you are not doing well or get worse. Document Released: 10/01/2010 Document Revised: 06/19/2013 Document Reviewed: 03/28/2013 [facility] Patient Information 2015 [place], [place]. This information is not intended to replace advice given to you by your health care provider. Make sure you discuss any questions you have with your health care provider.

## 2014-03-15 NOTE — Progress Notes (Signed)
Patient requested IV fluids to be stopped for now.

## 2014-03-15 NOTE — Progress Notes (Signed)
Pt. Is stable and ready to be discharged home. All discharge instructions given to patient. All prescriptions and appointments reviewed. Pt. Verbalizes she will pick up medications, take them correctly, and come to her appointment on 7/9. Pt. Has all belongings with her. Pt. Wheeled out via wheelchair to front, and was with her support person and they walked to the bus stop.

## 2014-03-16 ENCOUNTER — Encounter: Payer: Self-pay | Admitting: Obstetrics and Gynecology

## 2014-03-17 ENCOUNTER — Inpatient Hospital Stay (HOSPITAL_COMMUNITY): Payer: Medicaid Other

## 2014-03-17 ENCOUNTER — Inpatient Hospital Stay (HOSPITAL_COMMUNITY)
Admission: AD | Admit: 2014-03-17 | Discharge: 2014-03-24 | DRG: 765 | Disposition: A | Discharge status: Home / Self Care | Payer: Medicaid Other | Source: Ambulatory Visit | Attending: Obstetrics & Gynecology | Admitting: Obstetrics & Gynecology

## 2014-03-17 ENCOUNTER — Encounter (HOSPITAL_COMMUNITY): Payer: Self-pay | Admitting: *Deleted

## 2014-03-17 DIAGNOSIS — Z113 Encounter for screening for infections with a predominantly sexual mode of transmission: Secondary | ICD-10-CM

## 2014-03-17 DIAGNOSIS — O163 Unspecified maternal hypertension, third trimester: Secondary | ICD-10-CM | POA: Diagnosis present

## 2014-03-17 DIAGNOSIS — O365990 Maternal care for other known or suspected poor fetal growth, unspecified trimester, not applicable or unspecified: Secondary | ICD-10-CM | POA: Diagnosis present

## 2014-03-17 DIAGNOSIS — O0930 Supervision of pregnancy with insufficient antenatal care, unspecified trimester: Secondary | ICD-10-CM

## 2014-03-17 DIAGNOSIS — O365931 Maternal care for other known or suspected poor fetal growth, third trimester, fetus 1: Secondary | ICD-10-CM

## 2014-03-17 DIAGNOSIS — Z1151 Encounter for screening for human papillomavirus (HPV): Secondary | ICD-10-CM

## 2014-03-17 DIAGNOSIS — Z01419 Encounter for gynecological examination (general) (routine) without abnormal findings: Secondary | ICD-10-CM

## 2014-03-17 DIAGNOSIS — O99334 Smoking (tobacco) complicating childbirth: Secondary | ICD-10-CM | POA: Diagnosis present

## 2014-03-17 DIAGNOSIS — Z8751 Personal history of pre-term labor: Secondary | ICD-10-CM | POA: Diagnosis not present

## 2014-03-17 DIAGNOSIS — O1414 Severe pre-eclampsia complicating childbirth: Secondary | ICD-10-CM | POA: Diagnosis present

## 2014-03-17 DIAGNOSIS — J810 Acute pulmonary edema: Secondary | ICD-10-CM | POA: Diagnosis not present

## 2014-03-17 DIAGNOSIS — Z79899 Other long term (current) drug therapy: Secondary | ICD-10-CM

## 2014-03-17 DIAGNOSIS — O0933 Supervision of pregnancy with insufficient antenatal care, third trimester: Secondary | ICD-10-CM

## 2014-03-17 DIAGNOSIS — O99323 Drug use complicating pregnancy, third trimester: Secondary | ICD-10-CM

## 2014-03-17 DIAGNOSIS — F1410 Cocaine abuse, uncomplicated: Secondary | ICD-10-CM | POA: Diagnosis present

## 2014-03-17 DIAGNOSIS — O99344 Other mental disorders complicating childbirth: Secondary | ICD-10-CM | POA: Diagnosis present

## 2014-03-17 DIAGNOSIS — R51 Headache: Secondary | ICD-10-CM | POA: Diagnosis present

## 2014-03-17 DIAGNOSIS — O10019 Pre-existing essential hypertension complicating pregnancy, unspecified trimester: Secondary | ICD-10-CM

## 2014-03-17 DIAGNOSIS — Z3490 Encounter for supervision of normal pregnancy, unspecified, unspecified trimester: Secondary | ICD-10-CM

## 2014-03-17 DIAGNOSIS — Z7982 Long term (current) use of aspirin: Secondary | ICD-10-CM | POA: Diagnosis not present

## 2014-03-17 DIAGNOSIS — Z8249 Family history of ischemic heart disease and other diseases of the circulatory system: Secondary | ICD-10-CM | POA: Diagnosis not present

## 2014-03-17 DIAGNOSIS — O169 Unspecified maternal hypertension, unspecified trimester: Secondary | ICD-10-CM | POA: Diagnosis present

## 2014-03-17 HISTORY — DX: Gestational (pregnancy-induced) hypertension without significant proteinuria, unspecified trimester: O13.9

## 2014-03-17 HISTORY — DX: Mental disorder, not otherwise specified: F99

## 2014-03-17 HISTORY — DX: Anemia, unspecified: D64.9

## 2014-03-17 LAB — CBC
HCT: 30.7 % — ABNORMAL LOW (ref 36.0–46.0)
Hemoglobin: 10.1 g/dL — ABNORMAL LOW (ref 12.0–15.0)
MCH: 29.7 pg (ref 26.0–34.0)
MCHC: 32.9 g/dL (ref 30.0–36.0)
MCV: 90.3 fL (ref 78.0–100.0)
Platelets: 192 10*3/uL (ref 150–400)
RBC: 3.4 MIL/uL — AB (ref 3.87–5.11)
RDW: 13.1 % (ref 11.5–15.5)
WBC: 13.1 10*3/uL — ABNORMAL HIGH (ref 4.0–10.5)

## 2014-03-17 LAB — RAPID URINE DRUG SCREEN, HOSP PERFORMED
Amphetamines: NOT DETECTED
Barbiturates: NOT DETECTED
Benzodiazepines: NOT DETECTED
COCAINE: NOT DETECTED
OPIATES: NOT DETECTED
Tetrahydrocannabinol: NOT DETECTED

## 2014-03-17 LAB — COMPREHENSIVE METABOLIC PANEL
ALT: 56 U/L — ABNORMAL HIGH (ref 0–35)
AST: 46 U/L — ABNORMAL HIGH (ref 0–37)
Albumin: 2.7 g/dL — ABNORMAL LOW (ref 3.5–5.2)
Alkaline Phosphatase: 103 U/L (ref 39–117)
Anion gap: 14 (ref 5–15)
BUN: 8 mg/dL (ref 6–23)
CALCIUM: 9 mg/dL (ref 8.4–10.5)
CO2: 23 mEq/L (ref 19–32)
Chloride: 103 mEq/L (ref 96–112)
Creatinine, Ser: 0.78 mg/dL (ref 0.50–1.10)
GFR calc non Af Amer: >90 mL/min (ref >90)
GLUCOSE: 89 mg/dL (ref 70–99)
Potassium: 3.5 mEq/L — ABNORMAL LOW (ref 3.7–5.3)
Sodium: 140 mEq/L (ref 137–147)
Total Bilirubin: 0.2 mg/dL — ABNORMAL LOW (ref 0.3–1.2)
Total Protein: 6.1 g/dL (ref 6.0–8.3)

## 2014-03-17 LAB — PROTEIN / CREATININE RATIO, URINE
CREATININE, URINE: 57.69 mg/dL
PROTEIN CREATININE RATIO: 0.21 — AB (ref 0.00–0.15)
Total Protein, Urine: 12.1 mg/dL

## 2014-03-17 LAB — SAMPLE TO BLOOD BANK

## 2014-03-17 LAB — TYPE AND SCREEN
ABO/RH(D): O POS
ANTIBODY SCREEN: NEGATIVE

## 2014-03-17 LAB — COCAINE METABOLITE (GC/LC/MS), URINE: BENZOYLECGONINE GC/MS CONF: 2049 ng/mL — AB (ref <100)

## 2014-03-17 MED ORDER — HYDRALAZINE HCL 20 MG/ML IJ SOLN
10.0000 mg | Freq: Once | INTRAMUSCULAR | Status: DC
  Filled 2014-03-17 (×2): qty 1

## 2014-03-17 MED ORDER — FAMOTIDINE 20 MG PO TABS
20.0000 mg | ORAL_TABLET | Freq: Two times a day (BID) | ORAL | Status: DC
  Administered 2014-03-17 – 2014-03-21 (×8): 20 mg via ORAL
  Filled 2014-03-17 (×8): qty 1

## 2014-03-17 MED ORDER — LABETALOL HCL 100 MG PO TABS
200.0000 mg | ORAL_TABLET | Freq: Two times a day (BID) | ORAL | Status: DC
  Administered 2014-03-17: 200 mg via ORAL
  Filled 2014-03-17: qty 2

## 2014-03-17 MED ORDER — DOCUSATE SODIUM 100 MG PO CAPS
100.0000 mg | ORAL_CAPSULE | Freq: Every day | ORAL | Status: DC
  Administered 2014-03-18 – 2014-03-21 (×4): 100 mg via ORAL
  Filled 2014-03-17 (×6): qty 1

## 2014-03-17 MED ORDER — ONDANSETRON 4 MG PO TBDP
4.0000 mg | ORAL_TABLET | Freq: Once | ORAL | Status: DC
  Filled 2014-03-17: qty 1

## 2014-03-17 MED ORDER — LACTATED RINGERS IV SOLN
INTRAVENOUS | Status: DC
  Administered 2014-03-17: 13:00:00 via INTRAVENOUS

## 2014-03-17 MED ORDER — CALCIUM CARBONATE ANTACID 500 MG PO CHEW: 2.0000 | CHEWABLE_TABLET | ORAL | Status: DC | PRN

## 2014-03-17 MED ORDER — FENTANYL CITRATE 0.05 MG/ML IJ SOLN
INTRAMUSCULAR | Status: AC
  Filled 2014-03-17: qty 2

## 2014-03-17 MED ORDER — ONDANSETRON HCL 4 MG/2ML IJ SOLN
4.0000 mg | Freq: Once | INTRAMUSCULAR | Status: DC
  Filled 2014-03-17: qty 2

## 2014-03-17 MED ORDER — LABETALOL HCL 5 MG/ML IV SOLN
20.0000 mg | INTRAVENOUS | Status: DC | PRN
Start: 2014-03-17 — End: 2014-03-20
  Administered 2014-03-17 – 2014-03-20 (×5): 20 mg via INTRAVENOUS
  Filled 2014-03-17 (×4): qty 4

## 2014-03-17 MED ORDER — MAGNESIUM SULFATE 40 G IN LACTATED RINGERS - SIMPLE
2.0000 g/h | INTRAVENOUS | Status: DC
  Administered 2014-03-18 – 2014-03-19 (×2): 2 g/h via INTRAVENOUS
  Filled 2014-03-17 (×3): qty 500

## 2014-03-17 MED ORDER — LABETALOL HCL 5 MG/ML IV SOLN
20.0000 mg | Freq: Once | INTRAVENOUS | Status: DC
  Filled 2014-03-17: qty 4

## 2014-03-17 MED ORDER — OXYCODONE-ACETAMINOPHEN 5-325 MG PO TABS: 1.0000 | ORAL_TABLET | Freq: Four times a day (QID) | ORAL | Status: DC | PRN

## 2014-03-17 MED ORDER — LABETALOL HCL 100 MG PO TABS
100.0000 mg | ORAL_TABLET | Freq: Once | ORAL | Status: AC
  Administered 2014-03-17: 100 mg via ORAL
  Filled 2014-03-17: qty 1

## 2014-03-17 MED ORDER — MAGNESIUM SULFATE BOLUS VIA INFUSION
4.0000 g | Freq: Once | INTRAVENOUS | Status: AC
  Administered 2014-03-17: 4 g via INTRAVENOUS
  Filled 2014-03-17: qty 500

## 2014-03-17 MED ORDER — PRENATAL MULTIVITAMIN CH
1.0000 | ORAL_TABLET | Freq: Every day | ORAL | Status: DC
  Administered 2014-03-18 – 2014-03-20 (×3): 1 via ORAL
  Filled 2014-03-17 (×3): qty 1

## 2014-03-17 MED ORDER — ZOLPIDEM TARTRATE 5 MG PO TABS
5.0000 mg | ORAL_TABLET | Freq: Every evening | ORAL | Status: DC | PRN
  Administered 2014-03-18 – 2014-03-21 (×3): 5 mg via ORAL
  Filled 2014-03-17 (×3): qty 1

## 2014-03-17 MED ORDER — LACTATED RINGERS IV SOLN
INTRAVENOUS | Status: DC
  Administered 2014-03-18 – 2014-03-20 (×4): via INTRAVENOUS

## 2014-03-17 MED ORDER — ACETAMINOPHEN 325 MG PO TABS
650.0000 mg | ORAL_TABLET | ORAL | Status: DC | PRN
  Administered 2014-03-17 – 2014-03-21 (×8): 650 mg via ORAL
  Filled 2014-03-17 (×8): qty 2

## 2014-03-17 MED ORDER — FENTANYL CITRATE 0.05 MG/ML IJ SOLN
100.0000 ug | Freq: Once | INTRAMUSCULAR | Status: AC
  Administered 2014-03-17: 100 ug via INTRAVENOUS

## 2014-03-17 MED ORDER — CYCLOBENZAPRINE HCL 10 MG PO TABS
10.0000 mg | ORAL_TABLET | Freq: Once | ORAL | Status: AC
  Administered 2014-03-17: 10 mg via ORAL
  Filled 2014-03-17: qty 1

## 2014-03-17 MED ORDER — OXYCODONE-ACETAMINOPHEN 5-325 MG PO TABS
1.0000 | ORAL_TABLET | ORAL | Status: DC | PRN
  Filled 2014-03-17: qty 1

## 2014-03-17 NOTE — Progress Notes (Addendum)
Patient ID: Marissa Meyer, female   DOB: [birth year], 33 y.o.   MRN: [id number] SUBJECTIVE: [id number] at [redacted]w[redacted]d admitted for severe range BPs and H/A. Known CHTN, FGR, cocaine abuse. Has had 3 doses IV labetalol 20mg  and continues to have BPs at borderline severe range. Had Flexeril for H/A without improvement. No visual sx. Mild epigastric pain. Aware of nonpainful UCs.   OBJECTIVE: Filed Vitals:   03/17/14 1504 03/17/14 1509 03/17/14 1512 03/17/14 1514  BP:   168/103   Pulse: 81 80 89 131  Temp:      TempSrc:      Resp:      Height:      Weight:      SpO2: 94% 96%  95%  Fentanyl 100 mcg IVP for H/A pain Now vomiting  Abd: NT LE: DTRs 2+, no clonus FHR 135-140, mod variability, 10bpm accelerations, occasional mild variable UCs q 2-6  ASSESSMENT/PLAN: Ongoiong concern for severe preeclampsia D/W Dr. Kennon Rounds: Will start MgSO4, Hydralazine if BP remains in sever range Zofran IV Lorene Dy, CNM 03/17/2014 3:19 PM

## 2014-03-17 NOTE — MAU Note (Signed)
Pt presents to MAU with complaints of a severe headache since this morning around 9am. She is taking labetalol for her high blood pressure during the pregnancy. She has not taken her labetalol this morning and states she hasn't eat this morning either.Reports  Last cocaine use June the 28th.

## 2014-03-17 NOTE — MAU Note (Signed)
Per Joseph Art, RN charge antenatal, pt to go to room 155 when RN arrives from birthing suites

## 2014-03-17 NOTE — H&P (Signed)
Marissa Meyer is a 33 y.o. [phone] at [redacted]w[redacted]d admitted for severe range blood pressures with known CHTN and persistent headache> r/o preE.She is being followed for FGR (<10th%ile growth, AC<3rd%ile) and cocaine abuse. Recent admission 7/2-03/15/14 with severe range BPs and had MFM consult.   Fetal presentation is cephalic.  History of Present Illness: She presented to maternity admissions reporting awakening with severe throbbing H/A behind both eyes. Took 2 Tylenol without relief. Denies visual symptoms. Does have constant epigastric discomfort. Did not take labetalol this morning. She has dizziness and N/V.  She was having irregular contractions which were not painful. Denies illicit substance abuse since cocaine 8 days ago. Denies leakage of fluid or vaginal bleeding. Good fetal movement.    Patient Active Problem List   Diagnosis Date Noted  . Hypertension affecting pregnancy in third trimester, antepartum 03/17/2014  . 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 Insufficient prenatal care in third trimester 03/13/2014  . Fetal growth restriction 03/13/2014  . Chronic hypertension, antepartum 02/20/2014  . Late prenatal care starting at 30 weeks 02/20/2014  . History of preterm labor  02/20/2014  . Tobacco use in pregnancy, antepartum 02/20/2014  . Hypertension    Past Medical History: Past Medical History  Diagnosis Date  . Hypertension   . Chlamydia   . Gonorrhea   . Trichomonas   . Migraines     but not diagnosed  . Ectopic pregnancy   . Preterm delivery   . Fibroid     Past Surgical History: Past Surgical History  Procedure Laterality Date  . Dilation and curettage of uterus    . Ectopic pregnancy surgery      Obstetrical History: OB History   Grav Para Term Preterm Abortions TAB SAB Ect Mult Living   4 2 0 2 1   1  2       Social History: History   Social History  . Marital Status: Single    Spouse Name: N/A    Number of Children:  N/A  . Years of Education: N/A   Social History Main Topics  . Smoking status: Former Smoker -- 0.25 packs/day    Types: Cigarettes    Quit date: 08/13/2013  . Smokeless tobacco: Never Used  . Alcohol Use: Yes     Comment: occ  . Drug Use: Yes    Special: Cocaine  . Sexual Activity: Yes    Birth Control/ Protection: None     Comment: Last intercourse 3 days ago   Other Topics Concern  . None   Social History Narrative  . None    Family History: Family History  Problem Relation Age of Onset  . Hypertension Mother   . Hypertension Maternal Grandmother   . Cancer Maternal Grandfather     Allergies: Allergies  Allergen Reactions  . Amoxicillin Hives    Prescriptions prior to admission  Medication Sig Dispense Refill  . acetaminophen (TYLENOL) 325 MG tablet Take 650 mg by mouth every 6 (six) hours as needed for headache.      Marland Kitchen aspirin 81 MG tablet Take 1 tablet (81 mg total) by mouth daily.  30 tablet  6  . labetalol (NORMODYNE) 100 MG tablet Take 1 tablet (100 mg total) by mouth 2 (two) times daily.  60 tablet  3  . Prenatal Vit-Fe Fumarate-FA (PRENATAL MULTIVITAMIN) TABS tablet Take 1 tablet by mouth daily at 12 noon.        Review of Systems  Constitutional: Negative for  fever, chills, weight loss, malaise/fatigue  HENT: Negative for hearing loss, ear pain, nosebleeds, congestion, sore throat, neck pain, tinnitus and ear discharge.   Eyes: Negative for blurred vision, double vision, photophobia Respiratory: Negative for cough, hemoptysis, sputum production, shortness of breath, wheezing and stridor.   Cardiovascular: Negative for chest pain, palpitations, orthopnea, claudication, leg swelling and PND.  Gastrointestinal:negative for abdominal pain Negative for heartburn diarrhea, constipation, blood in stool and melena. Positive for N/V x2 today Genitourinary: Negative for dysuria, urgency, frequency, hematuria and flank pain.  Musculoskeletal: Negative for  myalgias, back pain, joint pain and falls.  Skin: Negative for itching and rash.  Neurological: Negative for tingling, tremors, sensory change, speech change, focal weakness, seizures, loss of consciousness, weakness Positive for dizziness and headaches Psychiatric/Behavioral: Negative for depression, Positive for substance abuse.   Vitals:  Blood pressure 155/111, pulse 83, temperature 98.6 F (37 C), temperature source Oral, resp. rate 20, last menstrual period 07/25/2013, SpO2 100.00%. Filed Vitals:   03/17/14 1256 03/17/14 1301 03/17/14 1306 03/17/14 1312  BP: 188/96 192/98 190/87 155/111  Pulse: 77 84 80 83  Temp:      TempSrc:      Resp:      SpO2:       Physical Examination:  General appearance - alert, well appearing, and in no distress, overweight and ill-appearing Abdomen: mild epigastric TTP, no rebound and fundal height  is size less than dates  Cervix: 1/50/ballotable per RN Extremities: edema trace with DTRs 2+ on the bilateral LE Membranes:intact Fetal Monitoring:Baseline: 125 bpm, moderate variability, 10bpm accelerations, occ mild variable, Discontinuous tracing Irregular mild UCs  Labs:  Results for orders placed during the hospital encounter of 03/17/14 (from the past 24 hour(s))  PROTEIN / CREATININE RATIO, URINE   Collection Time    03/17/14 10:20 AM      Result Value Ref Range   Creatinine, Urine 57.69     Total Protein, Urine 12.1     PROTEIN CREATININE RATIO 0.21 (*) 0.00 - 0.15  URINE RAPID DRUG SCREEN (HOSP PERFORMED)   Collection Time    03/17/14 10:20 AM      Result Value Ref Range   Opiates NONE DETECTED  NONE DETECTED   Cocaine NONE DETECTED  NONE DETECTED   Benzodiazepines NONE DETECTED  NONE DETECTED   Amphetamines NONE DETECTED  NONE DETECTED   Tetrahydrocannabinol NONE DETECTED  NONE DETECTED   Barbiturates NONE DETECTED  NONE DETECTED  CBC   Collection Time    03/17/14 11:30 AM      Result Value Ref Range   WBC 13.1 (*) 4.0 - 10.5  K/uL   RBC 3.40 (*) 3.87 - 5.11 MIL/uL   Hemoglobin 10.1 (*) 12.0 - 15.0 g/dL   HCT 30.7 (*) 36.0 - 46.0 %   MCV 90.3  78.0 - 100.0 fL   MCH 29.7  26.0 - 34.0 pg   MCHC 32.9  30.0 - 36.0 g/dL   RDW 13.1  11.5 - 15.5 %   Platelets 192  150 - 400 K/uL  COMPREHENSIVE METABOLIC PANEL   Collection Time    03/17/14 11:30 AM      Result Value Ref Range   Sodium 140  137 - 147 mEq/L   Potassium 3.5 (*) 3.7 - 5.3 mEq/L   Chloride 103  96 - 112 mEq/L   CO2 23  19 - 32 mEq/L   Glucose, Bld 89  70 - 99 mg/dL   BUN 8  6 - 23  mg/dL   Creatinine, Ser 0.78  0.50 - 1.10 mg/dL   Calcium 9.0  8.4 - 10.5 mg/dL   Total Protein 6.1  6.0 - 8.3 g/dL   Albumin 2.7 (*) 3.5 - 5.2 g/dL   AST 46 (*) 0 - 37 U/L   ALT 56 (*) 0 - 35 U/L   Alkaline Phosphatase 103  39 - 117 U/L   Total Bilirubin 0.2 (*) 0.3 - 1.2 mg/dL   GFR calc non Af Amer >90  >90 mL/min   GFR calc Af Amer >90  >90 mL/min   Anion gap 14  5 - 15  SAMPLE TO BLOOD BANK   Collection Time    03/17/14 11:30 AM      Result Value Ref Range   Blood Bank Specimen SAMPLE AVAILABLE FOR TESTING     Sample Expiration 03/20/2014      Imaging Studies: US Ob Follow Up  03/13/2014   OBSTETRICAL ULTRASOUND: This exam was performed within a [place] [facility]. The OB US report was generated in the AS system, and faxed to the ordering physician.   This report is available in the [facility]. See the AS Obstetric US report via the Image Link.   . cyclobenzaprine  10 mg Oral Once  . docusate sodium  100 mg Oral Daily  . labetalol  200 mg Oral BID  . labetalol  20 mg Intravenous Once  . ondansetron  4 mg Oral Once  . prenatal multivitamin  1 tablet Oral Q1200   I have reviewed the patient's current medications.   ASSESSMENT: Patient Active Problem List   Diagnosis Date Noted  . Hypertension affecting pregnancy in third trimester, antepartum 03/17/2014  . 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  Insufficient prenatal care in third trimester 03/13/2014  . Fetal growth restriction 03/13/2014  . Chronic hypertension, antepartum 02/20/2014  . Late prenatal care starting at 30 weeks 02/20/2014  . History of preterm labor  02/20/2014  . Tobacco use in pregnancy, antepartum 02/20/2014  . Hypertension    PLAN: Admit for further evaluation. IV labetalol 20mg  x2, Zofran ODT, Flexeril 8 mg given in MAU. Increased labetalol regimen to 200 mg po bid.  C/W Dr. Larina Earthly, CNM 03/17/2014 1:48 PM

## 2014-03-17 NOTE — MAU Provider Note (Signed)
Attestation of Attending Supervision of Advanced Practitioner (PA/CNM/NP): Evaluation and management procedures were performed by the Advanced Practitioner under my supervision and collaboration.  I have reviewed the Advanced Practitioner's note and chart, and I agree with the management and plan.  Donnamae Jude, MD [facility] Attending 03/17/2014 1:57 PM

## 2014-03-17 NOTE — MAU Note (Signed)
Patient states she was discharged from [facility] on 7-4 and felt better on 7-5. States today she woke up with a headache, feeling dizzy and having contractions. Feels disoriented. Took Tylenol this am but did not help. Was given a new medication but has not taken today. Denies bleeding or leaking and reports good fetal movement.

## 2014-03-17 NOTE — Progress Notes (Signed)
Resident notified and states she is currently doing a delivery and will come and evaluate pt when she is finished.

## 2014-03-17 NOTE — H&P (Signed)
Will admit for medication management and delivery with persistent severe features at 34 wks, or sooner if needed.  She is s/p BMZ. BP's are up in severe range and labs are stable.   Attestation of Attending Supervision of Advanced Practitioner (PA/CNM/NP): Evaluation and management procedures were performed by the Advanced Practitioner under my supervision and collaboration.  I have reviewed the Advanced Practitioner's note and chart, and I agree with the management and plan.  Donnamae Jude, MD [facility] Attending 03/17/2014 1:57 PM

## 2014-03-17 NOTE — MAU Provider Note (Signed)
Chief Complaint:  Headache, Contractions and Dizziness  Initial contact at 1125    HPI: Marissa Meyer is a 33 y.o. [id number] at [redacted]w[redacted]d who presents to maternity admissions reporting awakening with severe throbbing H/A behind both eyes. Took 2 Tylenol without relief. Denies leakage of fluid or vaginal bleeding. Good fetal movement.   Pregnancy Course: NPC until 33 wks; CHTN with baseline proteinuria 03/13/14 83 on 1375 volume, then started on labetalol 100 bid; cocaine abuse; FGR<10th, AC,3rd, AFI nl, dated by [place] at [redacted]w[redacted]d; hx PTBx2; smoker  Past Medical History: Past Medical History  Diagnosis Date  . Hypertension   . Chlamydia   . Gonorrhea   . Trichomonas   . Migraines     but not diagnosed  . Ectopic pregnancy   . Preterm delivery   . Fibroid     Past obstetric history: OB History  Gravida Para Term Preterm AB SAB TAB Ectopic Multiple Living  4 2 0 2 1   1  2     # Outcome Date GA Lbr Len/2nd Weight Sex Delivery Anes PTL Lv  4 CUR           3 PRE      SVD     2 ECT           1 PRE     F SVD   Y      Past Surgical History: Past Surgical History  Procedure Laterality Date  . Dilation and curettage of uterus    . Ectopic pregnancy surgery       Family History: Family History  Problem Relation Age of Onset  . Hypertension Mother   . Hypertension Maternal Grandmother   . Cancer Maternal Grandfather     Social History: History  Substance Use Topics  . Smoking status: Former Smoker -- 0.25 packs/day    Types: Cigarettes    Quit date: 08/13/2013  . Smokeless tobacco: Never Used  . Alcohol Use: Yes     Comment: occ    Allergies:  Allergies  Allergen Reactions  . Amoxicillin Hives    Meds:  Prescriptions prior to admission  Medication Sig Dispense Refill  . acetaminophen (TYLENOL) 325 MG tablet Take 650 mg by mouth every 6 (six) hours as needed for headache.      Marland Kitchen aspirin 81 MG tablet Take 1 tablet (81 mg total) by mouth daily.  30 tablet  6  . labetalol  (NORMODYNE) 100 MG tablet Take 1 tablet (100 mg total) by mouth 2 (two) times daily.  60 tablet  3  . Prenatal Vit-Fe Fumarate-FA (PRENATAL MULTIVITAMIN) TABS tablet Take 1 tablet by mouth daily at 12 noon.        ROS: Pertinent findings in history of present illness.  Physical Exam  Blood pressure 152/107, pulse 85, temperature 98.6 F (37 C), temperature source Oral, resp. rate 20, last menstrual period 07/25/2013, SpO2 100.00%. Filed Vitals:   03/17/14 1138 03/17/14 1146 03/17/14 1201 03/17/14 1216  BP: 174/103 164/99 180/111 150/98  Pulse: 87 129 89 97  Temp:      TempSrc:      Resp:      SpO2:       GENERAL: Well-developed, well-nourished female in no acute distress.  HEENT: normocephalic HEART: normal rate RESP: normal effort ABDOMEN: Soft, non-tender, gravid appropriate for gestational age EXTREMITIES: Nontender, no edema NEURO: alert and oriented SPECULUM EXAM: NEFG, physiologic discharge, no blood, cervix clean Dilation: 1 Effacement (%): 50 Station: -3 Exam by::  ginger morris rn  FHT:  Baseline 130 , moderate variability, accelerations present, mild variable deceleration w/ UC > assessing Contractions: q 10 mins   Labs: Results for orders placed during the hospital encounter of 03/17/14 (from the past 24 hour(s))  PROTEIN / CREATININE RATIO, URINE     Status: Abnormal   Collection Time    03/17/14 10:20 AM      Result Value Ref Range   Creatinine, Urine 57.69     Total Protein, Urine 12.1     PROTEIN CREATININE RATIO 0.21 (*) 0.00 - 0.15  URINE RAPID DRUG SCREEN (HOSP PERFORMED)     Status: None   Collection Time    03/17/14 10:20 AM      Result Value Ref Range   Opiates NONE DETECTED  NONE DETECTED   Cocaine NONE DETECTED  NONE DETECTED   Benzodiazepines NONE DETECTED  NONE DETECTED   Amphetamines NONE DETECTED  NONE DETECTED   Tetrahydrocannabinol NONE DETECTED  NONE DETECTED   Barbiturates NONE DETECTED  NONE DETECTED  CBC     Status: Abnormal    Collection Time    03/17/14 11:30 AM      Result Value Ref Range   WBC 13.1 (*) 4.0 - 10.5 K/uL   RBC 3.40 (*) 3.87 - 5.11 MIL/uL   Hemoglobin 10.1 (*) 12.0 - 15.0 g/dL   HCT 30.7 (*) 36.0 - 46.0 %   MCV 90.3  78.0 - 100.0 fL   MCH 29.7  26.0 - 34.0 pg   MCHC 32.9  30.0 - 36.0 g/dL   RDW 13.1  11.5 - 15.5 %   Platelets 192  150 - 400 K/uL  COMPREHENSIVE METABOLIC PANEL     Status: Abnormal   Collection Time    03/17/14 11:30 AM      Result Value Ref Range   Sodium 140  137 - 147 mEq/L   Potassium 3.5 (*) 3.7 - 5.3 mEq/L   Chloride 103  96 - 112 mEq/L   CO2 23  19 - 32 mEq/L   Glucose, Bld 89  70 - 99 mg/dL   BUN 8  6 - 23 mg/dL   Creatinine, Ser 0.78  0.50 - 1.10 mg/dL   Calcium 9.0  8.4 - 10.5 mg/dL   Total Protein 6.1  6.0 - 8.3 g/dL   Albumin 2.7 (*) 3.5 - 5.2 g/dL   AST 46 (*) 0 - 37 U/L   ALT 56 (*) 0 - 35 U/L   Alkaline Phosphatase 103  39 - 117 U/L   Total Bilirubin 0.2 (*) 0.3 - 1.2 mg/dL   GFR calc non Af Amer >90  >90 mL/min   GFR calc Af Amer >90  >90 mL/min   Anion gap 14  5 - 15  SAMPLE TO BLOOD BANK     Status: None   Collection Time    03/17/14 11:30 AM      Result Value Ref Range   Blood Bank Specimen SAMPLE AVAILABLE FOR TESTING     Sample Expiration 03/20/2014      Imaging:  US Ob Follow Up  03/13/2014   OBSTETRICAL ULTRASOUND: This exam was performed within a [place]tras[facility] Department. The OB US report was generated in the [facility] system, and faxed to the ordering physician.   This report is available in the [facility]. See the [facility] Obstetric US report via the Image Link.  MAU Course: IV LR Labetalol 20 mg IVP x2 Zofran ODT 4mg  Flexeril  10mg  po  Assessment: 1. Cocaine abuse affecting pregnancy in third trimester   2. Insufficient prenatal care in third trimester   3. Benign essential hypertension antepartum   4. IUGR (intrauterine growth restriction) affecting care of mother, third trimester, fetus 5   CHTN with severe range BPs and H/A, r/o  superimposed preE  Plan: Admit to [place], CNM 03/17/2014 11:23 AM

## 2014-03-18 DIAGNOSIS — O169 Unspecified maternal hypertension, unspecified trimester: Secondary | ICD-10-CM | POA: Diagnosis present

## 2014-03-18 LAB — CBC
HEMATOCRIT: 28.5 % — AB (ref 36.0–46.0)
HEMOGLOBIN: 9.2 g/dL — AB (ref 12.0–15.0)
MCH: 29.1 pg (ref 26.0–34.0)
MCHC: 32.3 g/dL (ref 30.0–36.0)
MCV: 90.2 fL (ref 78.0–100.0)
PLATELETS: 181 10*3/uL (ref 150–400)
RBC: 3.16 MIL/uL — ABNORMAL LOW (ref 3.87–5.11)
RDW: 13.2 % (ref 11.5–15.5)
WBC: 8.7 10*3/uL (ref 4.0–10.5)

## 2014-03-18 LAB — COMPREHENSIVE METABOLIC PANEL
ALT: 51 U/L — ABNORMAL HIGH (ref 0–35)
AST: 33 U/L (ref 0–37)
Albumin: 2.4 g/dL — ABNORMAL LOW (ref 3.5–5.2)
Alkaline Phosphatase: 100 U/L (ref 39–117)
Anion gap: 11 (ref 5–15)
BUN: 6 mg/dL (ref 6–23)
CALCIUM: 7.3 mg/dL — AB (ref 8.4–10.5)
CHLORIDE: 100 meq/L (ref 96–112)
CO2: 24 meq/L (ref 19–32)
CREATININE: 0.8 mg/dL (ref 0.50–1.10)
GLUCOSE: 110 mg/dL — AB (ref 70–99)
Potassium: 3.2 mEq/L — ABNORMAL LOW (ref 3.7–5.3)
Sodium: 135 mEq/L — ABNORMAL LOW (ref 137–147)
Total Bilirubin: 0.2 mg/dL — ABNORMAL LOW (ref 0.3–1.2)
Total Protein: 5.6 g/dL — ABNORMAL LOW (ref 6.0–8.3)

## 2014-03-18 LAB — PRESCRIPTION MONITORING PROFILE (19 PANEL)
AMPHETAMINE/METH: NEGATIVE ng/mL
BARBITURATE SCREEN, URINE: NEGATIVE ng/mL
Benzodiazepine Screen, Urine: NEGATIVE ng/mL
Buprenorphine, Urine: NEGATIVE ng/mL
CREATININE, URINE: 289.92 mg/dL (ref >20.0)
Cannabinoid Scrn, Ur: NEGATIVE ng/mL
Carisoprodol, Urine: NEGATIVE ng/mL
Fentanyl, Ur: NEGATIVE ng/mL
MDMA URINE: NEGATIVE ng/mL
Meperidine, Ur: NEGATIVE ng/mL
Methadone Screen, Urine: NEGATIVE ng/mL
Methaqualone: NEGATIVE ng/mL
Nitrites, Initial: NEGATIVE ug/mL
OPIATE SCREEN, URINE: NEGATIVE ng/mL
OXYCODONE SCRN UR: NEGATIVE ng/mL
PHENCYCLIDINE, UR: NEGATIVE ng/mL
Propoxyphene: NEGATIVE ng/mL
TRAMADOL UR: NEGATIVE ng/mL
Tapentadol, urine: NEGATIVE ng/mL
Zolpidem, Urine: NEGATIVE ng/mL
pH, Initial: 6.4 pH (ref 4.5–8.9)

## 2014-03-18 LAB — CYTOLOGY - PAP

## 2014-03-18 LAB — MAGNESIUM: MAGNESIUM: 6 mg/dL — AB (ref 1.5–2.5)

## 2014-03-18 MED ORDER — LABETALOL HCL 100 MG PO TABS
300.0000 mg | ORAL_TABLET | Freq: Two times a day (BID) | ORAL | Status: DC
  Administered 2014-03-18 – 2014-03-19 (×3): 300 mg via ORAL
  Filled 2014-03-18 (×3): qty 3

## 2014-03-18 NOTE — Progress Notes (Signed)
Patient ID: Marissa Meyer, female   DOB: [birth year], 33 y.o.   MRN: [id number] [place] COMPREHENSIVE PROGRESS NOTE  Marissa Meyer is a 33 y.o. [id number] at [redacted]w[redacted]d  who is admitted for IUGR.   Fetal presentation is cephalic. Length of Stay:  1  Days  Subjective: Pt denies complaints. +FM , no ctx, No LOF  no loss of fluid per vagina.  Vitals:  Blood pressure 174/99, pulse 88, temperature 98.5 F (36.9 C), temperature source Oral, resp. rate 20, height 4\' 9"  (1.448 m), weight 137 lb (62.143 kg), last menstrual period 07/25/2013, SpO2 100.00%. Physical Examination: General appearance - alert, well appearing, and in no distress Abdomen - soft, nontender, nondistended, no masses or organomegaly gravid Extremities - no pedal edema noted Cervical Exam: Not evaluated.  Membranes:intact  Fetal Monitoring:  Baseline: 130's bpm, Variability: Good {> 6 bpm), Accelerations: Reactive and Decelerations: Variable: mild  Labs:  Results for orders placed during the hospital encounter of 03/17/14 (from the past 24 hour(s))  PROTEIN / CREATININE RATIO, URINE   Collection Time    03/17/14 10:20 AM      Result Value Ref Range   Creatinine, Urine 57.69     Total Protein, Urine 12.1     PROTEIN CREATININE RATIO 0.21 (*) 0.00 - 0.15  URINE RAPID DRUG SCREEN (HOSP PERFORMED)   Collection Time    03/17/14 10:20 AM      Result Value Ref Range   Opiates NONE DETECTED  NONE DETECTED   Cocaine NONE DETECTED  NONE DETECTED   Benzodiazepines NONE DETECTED  NONE DETECTED   Amphetamines NONE DETECTED  NONE DETECTED   Tetrahydrocannabinol NONE DETECTED  NONE DETECTED   Barbiturates NONE DETECTED  NONE DETECTED  CBC   Collection Time    03/17/14 11:30 AM      Result Value Ref Range   WBC 13.1 (*) 4.0 - 10.5 K/uL   RBC 3.40 (*) 3.87 - 5.11 MIL/uL   Hemoglobin 10.1 (*) 12.0 - 15.0 g/dL   HCT 30.7 (*) 36.0 - 46.0 %   MCV 90.3  78.0 - 100.0 fL   MCH 29.7  26.0 - 34.0 pg   MCHC 32.9  30.0 -  36.0 g/dL   RDW 13.1  11.5 - 15.5 %   Platelets 192  150 - 400 K/uL  COMPREHENSIVE METABOLIC PANEL   Collection Time    03/17/14 11:30 AM      Result Value Ref Range   Sodium 140  137 - 147 mEq/L   Potassium 3.5 (*) 3.7 - 5.3 mEq/L   Chloride 103  96 - 112 mEq/L   CO2 23  19 - 32 mEq/L   Glucose, Bld 89  70 - 99 mg/dL   BUN 8  6 - 23 mg/dL   Creatinine, Ser 0.78  0.50 - 1.10 mg/dL   Calcium 9.0  8.4 - 10.5 mg/dL   Total Protein 6.1  6.0 - 8.3 g/dL   Albumin 2.7 (*) 3.5 - 5.2 g/dL   AST 46 (*) 0 - 37 U/L   ALT 56 (*) 0 - 35 U/L   Alkaline Phosphatase 103  39 - 117 U/L   Total Bilirubin 0.2 (*) 0.3 - 1.2 mg/dL   GFR calc non Af Amer >90  >90 mL/min   GFR calc Af Amer >90  >90 mL/min   Anion gap 14  5 - 15  SAMPLE TO BLOOD BANK   Collection Time    03/17/14 11:30  AM      Result Value Ref Range   Blood Bank Specimen SAMPLE AVAILABLE FOR TESTING     Sample Expiration 03/20/2014    TYPE AND SCREEN   Collection Time    03/17/14 11:30 AM      Result Value Ref Range   ABO/RH(D) O POS     Antibody Screen NEG     Sample Expiration 03/20/2014      Imaging Studies:    n/a   Medications:  Scheduled . docusate sodium  100 mg Oral Daily  . famotidine  20 mg Oral BID  . hydrALAZINE  10 mg Intravenous Once  . labetalol  300 mg Oral BID  . labetalol  20 mg Intravenous Once  . ondansetron (ZOFRAN) IV  4 mg Intravenous Once  . ondansetron  4 mg Oral Once  . prenatal multivitamin  1 tablet Oral Q1200   I have reviewed the patient's current medications.  ASSESSMENT: Patient Active Problem List   Diagnosis Date Noted  . Elevated blood pressure complicating pregnancy, antepartum 03/18/2014  . Hypertension affecting pregnancy in third trimester, antepartum 03/17/2014  . 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 Insufficient prenatal care in third trimester 03/13/2014  . Fetal growth restriction 03/13/2014  . Chronic hypertension, antepartum 02/20/2014  .  Late prenatal care starting at 30 weeks 02/20/2014  . History of preterm labor  02/20/2014  . Tobacco use in pregnancy, antepartum 02/20/2014  . Hypertension     PLAN: Increase Labetalol to 300mg  bid  For IOL beginning tomorrow night for IUGR Continue routine antenatal care.   Marissa Meyer, Marissa Meyer 03/18/2014,9:36 AM

## 2014-03-19 ENCOUNTER — Inpatient Hospital Stay (HOSPITAL_COMMUNITY): Admit: 2014-03-19 | Payer: Medicaid Other

## 2014-03-19 LAB — CBC WITH DIFFERENTIAL/PLATELET
Basophils Absolute: 0 10*3/uL (ref 0.0–0.1)
Basophils Relative: 0 % (ref 0–1)
EOS PCT: 1 % (ref 0–5)
Eosinophils Absolute: 0 10*3/uL (ref 0.0–0.7)
HCT: 26.7 % — ABNORMAL LOW (ref 36.0–46.0)
Hemoglobin: 8.6 g/dL — ABNORMAL LOW (ref 12.0–15.0)
LYMPHS PCT: 20 % (ref 12–46)
Lymphs Abs: 1.5 10*3/uL (ref 0.7–4.0)
MCH: 29.3 pg (ref 26.0–34.0)
MCHC: 32.2 g/dL (ref 30.0–36.0)
MCV: 90.8 fL (ref 78.0–100.0)
Monocytes Absolute: 0.5 10*3/uL (ref 0.1–1.0)
Monocytes Relative: 6 % (ref 3–12)
Neutro Abs: 5.4 10*3/uL (ref 1.7–7.7)
Neutrophils Relative %: 73 % (ref 43–77)
Platelets: 170 10*3/uL (ref 150–400)
RBC: 2.94 MIL/uL — AB (ref 3.87–5.11)
RDW: 13.3 % (ref 11.5–15.5)
WBC: 7.4 10*3/uL (ref 4.0–10.5)

## 2014-03-19 LAB — COMPREHENSIVE METABOLIC PANEL
ALT: 38 U/L — ABNORMAL HIGH (ref 0–35)
ANION GAP: 11 (ref 5–15)
AST: 21 U/L (ref 0–37)
Albumin: 2.2 g/dL — ABNORMAL LOW (ref 3.5–5.2)
Alkaline Phosphatase: 100 U/L (ref 39–117)
BUN: 5 mg/dL — AB (ref 6–23)
CALCIUM: 6.9 mg/dL — AB (ref 8.4–10.5)
CO2: 23 meq/L (ref 19–32)
Chloride: 102 mEq/L (ref 96–112)
Creatinine, Ser: 0.87 mg/dL (ref 0.50–1.10)
GFR, EST NON AFRICAN AMERICAN: 87 mL/min — AB (ref >90)
GLUCOSE: 137 mg/dL — AB (ref 70–99)
Potassium: 2.8 mEq/L — CL (ref 3.7–5.3)
Sodium: 136 mEq/L — ABNORMAL LOW (ref 137–147)
Total Bilirubin: <0.2 mg/dL — ABNORMAL LOW (ref 0.3–1.2)
Total Protein: 5.2 g/dL — ABNORMAL LOW (ref 6.0–8.3)

## 2014-03-19 LAB — CREATININE, URINE, 24 HOUR
CREATININE, URINE: 45.7 mg/dL
Collection Interval-UCRE24: 24 hours
Creatinine, 24H Ur: 1210 mg/d (ref 700–1800)
URINE TOTAL VOLUME-UCRE24: 2650 mL

## 2014-03-19 LAB — PROTEIN, URINE, 24 HOUR
COLLECTION INTERVAL-UPROT: 24 h
Protein, 24H Urine: 106 mg/d — ABNORMAL HIGH (ref 50–100)
Protein, Urine: 4 mg/dL
Urine Total Volume-UPROT: 2650 mL

## 2014-03-19 LAB — PROTEIN / CREATININE RATIO, URINE
Creatinine, Urine: 38.83 mg/dL
Protein Creatinine Ratio: 0.1 (ref 0.00–0.15)
Total Protein, Urine: 4 mg/dL

## 2014-03-19 MED ORDER — LABETALOL HCL 100 MG PO TABS
200.0000 mg | ORAL_TABLET | Freq: Two times a day (BID) | ORAL | Status: DC
  Administered 2014-03-19: 200 mg via ORAL
  Filled 2014-03-19: qty 2

## 2014-03-19 MED ORDER — POTASSIUM CHLORIDE 10 MEQ/100ML IV SOLN
10.0000 meq | INTRAVENOUS | Status: AC
  Administered 2014-03-19 (×5): 10 meq via INTRAVENOUS
  Filled 2014-03-19 (×5): qty 100

## 2014-03-19 NOTE — Progress Notes (Signed)
Patient ID: Marissa Meyer, female   DOB: 14-May-1981, 33 y.o.   MRN: [id number] [facility]) NOTE  Marissa Meyer is a 33 y.o. [id number] at [redacted]w[redacted]d  who is admitted for hypertension exacerbation and r/u super imposed preeclampsia.   Length of Stay:  2  Days  Subjective: Pt denies headache, RUQ pain, and scotomata Patient reports the fetal movement as active. Patient reports uterine contraction  activity as none. Patient reports  vaginal bleeding as none. Patient describes fluid per vagina as None.  Vitals:  Blood pressure 153/86, pulse 96, temperature 97.8 F (36.6 C), temperature source Oral, resp. rate 20, height 4\' 9"  (1.448 m), weight 144 lb 6 oz (65.488 kg), last menstrual period 07/25/2013, SpO2 99.00%. Physical Examination:  General appearance - alert, well appearing, and in no distress Abdomen - gravid, soft, nontender Extremities - Homan's sign negative bilaterally  Fetal Monitoring:  Baseline: 120 bpm, Variability: Good {> 6 bpm), Accelerations: periods of 15x15 but other times 10x10 and Decelerations: Variable: moderate (but rare)  Labs:  Results for orders placed during the hospital encounter of 03/17/14 (from the past 24 hour(s))  CBC   Collection Time    03/18/14  2:19 PM      Result Value Ref Range   WBC 8.7  4.0 - 10.5 K/uL   RBC 3.16 (*) 3.87 - 5.11 MIL/uL   Hemoglobin 9.2 (*) 12.0 - 15.0 g/dL   HCT 28.5 (*) 36.0 - 46.0 %   MCV 90.2  78.0 - 100.0 fL   MCH 29.1  26.0 - 34.0 pg   MCHC 32.3  30.0 - 36.0 g/dL   RDW 13.2  11.5 - 15.5 %   Platelets 181  150 - 400 K/uL  COMPREHENSIVE METABOLIC PANEL   Collection Time    03/18/14  2:19 PM      Result Value Ref Range   Sodium 135 (*) 137 - 147 mEq/L   Potassium 3.2 (*) 3.7 - 5.3 mEq/L   Chloride 100  96 - 112 mEq/L   CO2 24  19 - 32 mEq/L   Glucose, Bld 110 (*) 70 - 99 mg/dL   BUN 6  6 - 23 mg/dL   Creatinine, Ser 0.80  0.50 - 1.10 mg/dL   Calcium 7.3 (*) 8.4 - 10.5 mg/dL   Total  Protein 5.6 (*) 6.0 - 8.3 g/dL   Albumin 2.4 (*) 3.5 - 5.2 g/dL   AST 33  0 - 37 U/L   ALT 51 (*) 0 - 35 U/L   Alkaline Phosphatase 100  39 - 117 U/L   Total Bilirubin 0.2 (*) 0.3 - 1.2 mg/dL   GFR calc non Af Amer >90  >90 mL/min   GFR calc Af Amer >90  >90 mL/min   Anion gap 11  5 - 15  MAGNESIUM   Collection Time    03/18/14  2:24 PM      Result Value Ref Range   Magnesium 6.0 (*) 1.5 - 2.5 mg/dL    Medications:  Scheduled . docusate sodium  100 mg Oral Daily  . famotidine  20 mg Oral BID  . hydrALAZINE  10 mg Intravenous Once  . labetalol  300 mg Oral BID  . labetalol  20 mg Intravenous Once  . ondansetron (ZOFRAN) IV  4 mg Intravenous Once  . ondansetron  4 mg Oral Once  . prenatal multivitamin  1 tablet Oral Q1200   I have reviewed the patient's current medications.  ASSESSMENT: Patient  Active Problem List   Diagnosis Date Noted  . Elevated blood pressure complicating pregnancy, antepartum 03/18/2014  . Hypertension affecting pregnancy in third trimester, antepartum 03/17/2014  . 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 Insufficient prenatal care in third trimester 03/13/2014  . Fetal growth restriction 03/13/2014  . Chronic hypertension, antepartum 02/20/2014  . Late prenatal care starting at 30 weeks 02/20/2014  . History of preterm labor  02/20/2014  . Tobacco use in pregnancy, antepartum 02/20/2014  . Hypertension     PLAN: Pt undergoing 24 hour urine to determine if she has preeclampsia.  Last prot/creat ratio was nml.  BP still has some severe range pressures .  RN advised to repeat BP within 20 mins after a severe range BP. Liver enzymes slightly improved.  Creat = .8.  Will repeat labs today. Continue magnesium sulfate for now.   Marissa Meyer H. 03/19/2014,7:40 AM

## 2014-03-19 NOTE — Progress Notes (Signed)
Patient ID: [name], female   DOB: [birth year], 33 y.o.   MRN: [id number] BP and [place] results and labs reviewed. Will increase labetalol to 400 mg BID and repeat cord doppler in AM. BP not severe range currently and we will stop magnesium.  Woodroe Mode, MD 03/19/2014 4:06 PM

## 2014-03-20 ENCOUNTER — Inpatient Hospital Stay (HOSPITAL_COMMUNITY): Payer: Medicaid Other

## 2014-03-20 LAB — COMPREHENSIVE METABOLIC PANEL
ALT: 31 U/L (ref 0–35)
AST: 20 U/L (ref 0–37)
Albumin: 2.3 g/dL — ABNORMAL LOW (ref 3.5–5.2)
Alkaline Phosphatase: 110 U/L (ref 39–117)
Anion gap: 10 (ref 5–15)
BUN: 9 mg/dL (ref 6–23)
CALCIUM: 8.3 mg/dL — AB (ref 8.4–10.5)
CO2: 25 meq/L (ref 19–32)
Chloride: 103 mEq/L (ref 96–112)
Creatinine, Ser: 0.87 mg/dL (ref 0.50–1.10)
GFR calc Af Amer: >90 mL/min (ref >90)
GFR calc non Af Amer: 87 mL/min — ABNORMAL LOW (ref >90)
Glucose, Bld: 101 mg/dL — ABNORMAL HIGH (ref 70–99)
Potassium: 3.9 mEq/L (ref 3.7–5.3)
Sodium: 138 mEq/L (ref 137–147)
TOTAL PROTEIN: 5.4 g/dL — AB (ref 6.0–8.3)
Total Bilirubin: 0.3 mg/dL (ref 0.3–1.2)

## 2014-03-20 LAB — CBC
HCT: 27.8 % — ABNORMAL LOW (ref 36.0–46.0)
HEMOGLOBIN: 9 g/dL — AB (ref 12.0–15.0)
MCH: 29.2 pg (ref 26.0–34.0)
MCHC: 32.4 g/dL (ref 30.0–36.0)
MCV: 90.3 fL (ref 78.0–100.0)
Platelets: 165 10*3/uL (ref 150–400)
RBC: 3.08 MIL/uL — AB (ref 3.87–5.11)
RDW: 13.3 % (ref 11.5–15.5)
WBC: 9.8 10*3/uL (ref 4.0–10.5)

## 2014-03-20 LAB — PROTEIN / CREATININE RATIO, URINE
Creatinine, Urine: 45.33 mg/dL
PROTEIN CREATININE RATIO: 0.09 (ref 0.00–0.15)
Total Protein, Urine: 4.3 mg/dL

## 2014-03-20 MED ORDER — SODIUM CHLORIDE 0.9 % IJ SOLN
3.0000 mL | Freq: Two times a day (BID) | INTRAMUSCULAR | Status: DC
  Administered 2014-03-20 – 2014-03-21 (×3): 3 mL via INTRAVENOUS

## 2014-03-20 MED ORDER — LABETALOL HCL 5 MG/ML IV SOLN
20.0000 mg | INTRAVENOUS | Status: DC | PRN
  Administered 2014-03-20 – 2014-03-21 (×9): 20 mg via INTRAVENOUS
  Filled 2014-03-20 (×9): qty 4

## 2014-03-20 MED ORDER — LABETALOL HCL 200 MG PO TABS
400.0000 mg | ORAL_TABLET | Freq: Three times a day (TID) | ORAL | Status: DC
  Administered 2014-03-20 – 2014-03-22 (×6): 400 mg via ORAL
  Filled 2014-03-20 (×2): qty 2
  Filled 2014-03-20 (×3): qty 4
  Filled 2014-03-20: qty 2
  Filled 2014-03-20: qty 4
  Filled 2014-03-20 (×2): qty 2

## 2014-03-20 MED ORDER — LABETALOL HCL 100 MG PO TABS: 200.0000 mg | ORAL_TABLET | Freq: Three times a day (TID) | ORAL | Status: DC

## 2014-03-20 MED ORDER — POTASSIUM CHLORIDE CRYS ER 20 MEQ PO TBCR
20.0000 meq | EXTENDED_RELEASE_TABLET | Freq: Two times a day (BID) | ORAL | Status: DC
  Administered 2014-03-20 – 2014-03-21 (×3): 20 meq via ORAL
  Filled 2014-03-20 (×3): qty 1

## 2014-03-20 MED ORDER — HYDRALAZINE HCL 20 MG/ML IJ SOLN
10.0000 mg | Freq: Once | INTRAMUSCULAR | Status: AC
  Administered 2014-03-20: 16:00:00 via INTRAVENOUS

## 2014-03-20 MED ORDER — PROMETHAZINE HCL 25 MG PO TABS: 25.0000 mg | ORAL_TABLET | Freq: Four times a day (QID) | ORAL | Status: DC | PRN

## 2014-03-20 MED ORDER — LABETALOL HCL 100 MG PO TABS
400.0000 mg | ORAL_TABLET | Freq: Two times a day (BID) | ORAL | Status: DC
  Administered 2014-03-20: 400 mg via ORAL
  Filled 2014-03-20: qty 4

## 2014-03-20 MED ORDER — SODIUM CHLORIDE 0.9 % IV SOLN
1020.0000 mg | Freq: Once | INTRAVENOUS | Status: AC
  Administered 2014-03-20: 1020 mg via INTRAVENOUS
  Filled 2014-03-20: qty 34

## 2014-03-20 NOTE — Progress Notes (Signed)
Patient ID: Marissa Meyer, female   DOB: [birth year], 33 y.o.   MRN: [id number] Patient ID: Marissa Meyer, female   DOB: Sep 22, 1980, 33 y.o.   MRN: [id number] [place]) NOTE  Marissa Meyer is a 33 y.o. [id number] at [redacted]w[redacted]d   who is admitted for hypertension exacerbation and r/o super imposed preeclampsia.   Length of Stay:  3  Days  Subjective: Pt denies headache, RUQ pain, and scotomata Patient reports the fetal movement as active. Patient reports uterine contraction  activity as none. Patient reports  vaginal bleeding as none. Patient describes fluid per vagina as None.  Filed Vitals:   03/20/14 0046 03/20/14 0256 03/20/14 0407 03/20/14 0615  BP: 168/93 160/82 168/90 157/84  Pulse: 88 97 96 96  Temp:    98.2 F (36.8 C)  TempSrc:      Resp:    18  Height:      Weight:      SpO2:       Physical Examination:  General appearance - alert, well appearing, and in no distress Abdomen - gravid, soft, nontender Extremities - Homan's sign negative bilaterally  Fetal Monitoring:  Baseline: 120 bpm, Variability: Good {> 6 bpm), Accelerations: periods of 15x15 but other times 10x10 and Decelerations: Variable: moderate (but rare)  Labs:  Results for orders placed during the hospital encounter of 03/17/14 (from the past 24 hour(s))  CBC WITH DIFFERENTIAL   Collection Time    03/19/14 12:07 PM      Result Value Ref Range   WBC 7.4  4.0 - 10.5 K/uL   RBC 2.94 (*) 3.87 - 5.11 MIL/uL   Hemoglobin 8.6 (*) 12.0 - 15.0 g/dL   HCT 26.7 (*) 36.0 - 46.0 %   MCV 90.8  78.0 - 100.0 fL   MCH 29.3  26.0 - 34.0 pg   MCHC 32.2  30.0 - 36.0 g/dL   RDW 13.3  11.5 - 15.5 %   Platelets 170  150 - 400 K/uL   Neutrophils Relative % 73  43 - 77 %   Neutro Abs 5.4  1.7 - 7.7 K/uL   Lymphocytes Relative 20  12 - 46 %   Lymphs Abs 1.5  0.7 - 4.0 K/uL   Monocytes Relative 6  3 - 12 %   Monocytes Absolute 0.5  0.1 - 1.0 K/uL   Eosinophils Relative 1  0 - 5 %   Eosinophils  Absolute 0.0  0.0 - 0.7 K/uL   Basophils Relative 0  0 - 1 %   Basophils Absolute 0.0  0.0 - 0.1 K/uL  COMPREHENSIVE METABOLIC PANEL   Collection Time    03/19/14 12:07 PM      Result Value Ref Range   Sodium 136 (*) 137 - 147 mEq/L   Potassium 2.8 (*) 3.7 - 5.3 mEq/L   Chloride 102  96 - 112 mEq/L   CO2 23  19 - 32 mEq/L   Glucose, Bld 137 (*) 70 - 99 mg/dL   BUN 5 (*) 6 - 23 mg/dL   Creatinine, Ser 0.87  0.50 - 1.10 mg/dL   Calcium 6.9 (*) 8.4 - 10.5 mg/dL   Total Protein 5.2 (*) 6.0 - 8.3 g/dL   Albumin 2.2 (*) 3.5 - 5.2 g/dL   AST 21  0 - 37 U/L   ALT 38 (*) 0 - 35 U/L   Alkaline Phosphatase 100  39 - 117 U/L   Total Bilirubin <0.2 (*) 0.3 - 1.2  mg/dL   GFR calc non Af Amer 87 (*) >90 mL/min   GFR calc Af Amer >90  >90 mL/min   Anion gap 11  5 - 15   24 hour urine 106 mg in 24 hours Medications:  Scheduled . docusate sodium  100 mg Oral Daily  . famotidine  20 mg Oral BID  . hydrALAZINE  10 mg Intravenous Once  . labetalol  200 mg Oral BID  . labetalol  20 mg Intravenous Once  . ondansetron (ZOFRAN) IV  4 mg Intravenous Once  . ondansetron  4 mg Oral Once  . prenatal multivitamin  1 tablet Oral Q1200   I have reviewed the patient's current medications.  ASSESSMENT: Patient Active Problem List   Diagnosis Date Noted  . Elevated blood pressure complicating pregnancy, antepartum 03/18/2014  . Hypertension affecting pregnancy in third trimester, antepartum 03/17/2014  . 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 Insufficient prenatal care in third trimester 03/13/2014  . Fetal growth restriction 03/13/2014  . Chronic hypertension, antepartum 02/20/2014  . Late prenatal care starting at 30 weeks 02/20/2014  . History of preterm labor  02/20/2014  . Tobacco use in pregnancy, antepartum 02/20/2014  . Hypertension     PLAN: Increase Labetalol 400 mg BID, not pre eclampsia at this point Weekly Doppler evaluations   Marissa Melena  Meyer 03/20/2014,7:40 AM

## 2014-03-21 ENCOUNTER — Inpatient Hospital Stay (HOSPITAL_COMMUNITY): Payer: Medicaid Other

## 2014-03-21 ENCOUNTER — Encounter (HOSPITAL_COMMUNITY): Payer: Medicaid Other | Admitting: Anesthesiology

## 2014-03-21 ENCOUNTER — Encounter (HOSPITAL_COMMUNITY): Payer: Self-pay

## 2014-03-21 ENCOUNTER — Encounter (HOSPITAL_COMMUNITY)
Admission: AD | Disposition: A | Discharge status: Home / Self Care | Payer: Self-pay | Source: Ambulatory Visit | Attending: Family Medicine

## 2014-03-21 ENCOUNTER — Inpatient Hospital Stay (HOSPITAL_COMMUNITY): Payer: Medicaid Other | Admitting: Anesthesiology

## 2014-03-21 DIAGNOSIS — O365990 Maternal care for other known or suspected poor fetal growth, unspecified trimester, not applicable or unspecified: Secondary | ICD-10-CM

## 2014-03-21 DIAGNOSIS — O99334 Smoking (tobacco) complicating childbirth: Secondary | ICD-10-CM

## 2014-03-21 DIAGNOSIS — Z3490 Encounter for supervision of normal pregnancy, unspecified, unspecified trimester: Secondary | ICD-10-CM

## 2014-03-21 DIAGNOSIS — O1414 Severe pre-eclampsia complicating childbirth: Secondary | ICD-10-CM

## 2014-03-21 DIAGNOSIS — J810 Acute pulmonary edema: Secondary | ICD-10-CM

## 2014-03-21 LAB — PREPARE RBC (CROSSMATCH)

## 2014-03-21 LAB — BLOOD GAS, ARTERIAL
ACID-BASE DEFICIT: 1.9 mmol/L (ref 0.0–2.0)
Bicarbonate: 21.3 mEq/L (ref 20.0–24.0)
Drawn by: 40556
FIO2: 1 %
O2 CONTENT: 15 L/min
O2 SAT: 98 %
TCO2: 22.2 mmol/L (ref 0–100)
pCO2 arterial: 32.2 mmHg — ABNORMAL LOW (ref 35.0–45.0)
pH, Arterial: 7.436 (ref 7.350–7.450)
pO2, Arterial: 85.5 mmHg (ref 80.0–100.0)

## 2014-03-21 LAB — COMPREHENSIVE METABOLIC PANEL
ALT: 33 U/L (ref 0–35)
AST: 25 U/L (ref 0–37)
Albumin: 2.4 g/dL — ABNORMAL LOW (ref 3.5–5.2)
Alkaline Phosphatase: 115 U/L (ref 39–117)
Anion gap: 12 (ref 5–15)
BUN: 10 mg/dL (ref 6–23)
CALCIUM: 9.2 mg/dL (ref 8.4–10.5)
CHLORIDE: 103 meq/L (ref 96–112)
CO2: 23 mEq/L (ref 19–32)
Creatinine, Ser: 0.81 mg/dL (ref 0.50–1.10)
GFR calc Af Amer: >90 mL/min (ref >90)
Glucose, Bld: 103 mg/dL — ABNORMAL HIGH (ref 70–99)
Potassium: 4 mEq/L (ref 3.7–5.3)
SODIUM: 138 meq/L (ref 137–147)
Total Bilirubin: <0.2 mg/dL — ABNORMAL LOW (ref 0.3–1.2)
Total Protein: 5.3 g/dL — ABNORMAL LOW (ref 6.0–8.3)

## 2014-03-21 LAB — CBC
HCT: 29 % — ABNORMAL LOW (ref 36.0–46.0)
HEMATOCRIT: 30.2 % — AB (ref 36.0–46.0)
Hemoglobin: 9.5 g/dL — ABNORMAL LOW (ref 12.0–15.0)
Hemoglobin: 10 g/dL — ABNORMAL LOW (ref 12.0–15.0)
MCH: 29.8 pg (ref 26.0–34.0)
MCH: 29.7 pg (ref 26.0–34.0)
MCHC: 32.8 g/dL (ref 30.0–36.0)
MCHC: 33.1 g/dL (ref 30.0–36.0)
MCV: 90.9 fL (ref 78.0–100.0)
MCV: 89.6 fL (ref 78.0–100.0)
Platelets: 170 10*3/uL (ref 150–400)
Platelets: 169 10*3/uL (ref 150–400)
RBC: 3.19 MIL/uL — AB (ref 3.87–5.11)
RBC: 3.37 MIL/uL — ABNORMAL LOW (ref 3.87–5.11)
RDW: 13.4 % (ref 11.5–15.5)
RDW: 13.5 % (ref 11.5–15.5)
WBC: 12.3 10*3/uL — ABNORMAL HIGH (ref 4.0–10.5)
WBC: 14.3 10*3/uL — ABNORMAL HIGH (ref 4.0–10.5)

## 2014-03-21 LAB — RPR

## 2014-03-21 LAB — PROTEIN / CREATININE RATIO, URINE
Creatinine, Urine: 19.72 mg/dL
Protein Creatinine Ratio: 0.2 — ABNORMAL HIGH (ref 0.00–0.15)
TOTAL PROTEIN, URINE: 4 mg/dL

## 2014-03-21 LAB — MAGNESIUM: Magnesium: 5.3 mg/dL — ABNORMAL HIGH (ref 1.5–2.5)

## 2014-03-21 SURGERY — Surgical Case
Anesthesia: Spinal | Site: Abdomen

## 2014-03-21 MED ORDER — OXYCODONE-ACETAMINOPHEN 5-325 MG PO TABS: 1.0000 | ORAL_TABLET | ORAL | Status: DC | PRN

## 2014-03-21 MED ORDER — PHENYLEPHRINE 8 MG IN D5W 100 ML (0.08MG/ML) PREMIX OPTIME
INJECTION | INTRAVENOUS | Status: DC | PRN
  Administered 2014-03-21: 60 ug/min via INTRAVENOUS

## 2014-03-21 MED ORDER — FLEET ENEMA 7-19 GM/118ML RE ENEM: 1.0000 | ENEMA | RECTAL | Status: DC | PRN

## 2014-03-21 MED ORDER — OXYTOCIN BOLUS FROM INFUSION: 500.0000 mL | INTRAVENOUS | Status: DC

## 2014-03-21 MED ORDER — OXYTOCIN 10 UNIT/ML IJ SOLN
40.0000 [IU] | INTRAVENOUS | Status: DC | PRN
  Administered 2014-03-21: 40 [IU] via INTRAVENOUS

## 2014-03-21 MED ORDER — MORPHINE SULFATE (PF) 0.5 MG/ML IJ SOLN
INTRAMUSCULAR | Status: DC | PRN
  Administered 2014-03-21: .1 mg via INTRATHECAL

## 2014-03-21 MED ORDER — LACTATED RINGERS IV SOLN
INTRAVENOUS | Status: DC
  Administered 2014-03-21: 12:00:00 via INTRAVENOUS

## 2014-03-21 MED ORDER — GENTAMICIN SULFATE 40 MG/ML IJ SOLN
INTRAMUSCULAR | Status: AC
  Administered 2014-03-21: 114.75 mL via INTRAVENOUS
  Filled 2014-03-21: qty 8.75

## 2014-03-21 MED ORDER — MAGNESIUM SULFATE BOLUS VIA INFUSION
4.0000 g | Freq: Once | INTRAVENOUS | Status: AC
  Administered 2014-03-21: 4 g via INTRAVENOUS
  Filled 2014-03-21: qty 500

## 2014-03-21 MED ORDER — FUROSEMIDE 10 MG/ML IJ SOLN
20.0000 mg | Freq: Once | INTRAMUSCULAR | Status: AC
  Administered 2014-03-21: 20 mg via INTRAVENOUS
  Filled 2014-03-21: qty 2

## 2014-03-21 MED ORDER — PHENYLEPHRINE HCL 10 MG/ML IJ SOLN
INTRAMUSCULAR | Status: AC
  Filled 2014-03-21: qty 1

## 2014-03-21 MED ORDER — FENTANYL CITRATE 0.05 MG/ML IJ SOLN: 25.0000 ug | INTRAMUSCULAR | Status: DC | PRN

## 2014-03-21 MED ORDER — BUPIVACAINE IN DEXTROSE 0.75-8.25 % IT SOLN
INTRATHECAL | Status: DC | PRN
  Administered 2014-03-21: 1.2 mL via INTRATHECAL

## 2014-03-21 MED ORDER — MISOPROSTOL 25 MCG QUARTER TABLET
25.0000 ug | ORAL_TABLET | ORAL | Status: DC | PRN
  Administered 2014-03-21: 25 ug via VAGINAL
  Filled 2014-03-21: qty 0.25

## 2014-03-21 MED ORDER — CITRIC ACID-SODIUM CITRATE 334-500 MG/5ML PO SOLN
30.0000 mL | ORAL | Status: DC | PRN
  Administered 2014-03-21: 30 mL via ORAL
  Filled 2014-03-21: qty 15

## 2014-03-21 MED ORDER — MAGNESIUM SULFATE 40 G IN LACTATED RINGERS - SIMPLE
2.0000 g/h | INTRAVENOUS | Status: DC
  Administered 2014-03-21: 2 g/h via INTRAVENOUS
  Administered 2014-03-21: 4 g/h via INTRAVENOUS
  Filled 2014-03-21: qty 500

## 2014-03-21 MED ORDER — LACTATED RINGERS IV SOLN
500.0000 mL | INTRAVENOUS | Status: DC | PRN
  Administered 2014-03-21: 500 mL via INTRAVENOUS

## 2014-03-21 MED ORDER — METOCLOPRAMIDE HCL 5 MG/ML IJ SOLN: 10.0000 mg | Freq: Once | INTRAMUSCULAR | Status: DC | PRN

## 2014-03-21 MED ORDER — LABETALOL HCL 200 MG PO TABS
400.0000 mg | ORAL_TABLET | Freq: Three times a day (TID) | ORAL | Status: DC
  Filled 2014-03-21: qty 2

## 2014-03-21 MED ORDER — OXYTOCIN 40 UNITS IN LACTATED RINGERS INFUSION - SIMPLE MED: 62.5000 mL/h | INTRAVENOUS | Status: DC

## 2014-03-21 MED ORDER — SCOPOLAMINE 1 MG/3DAYS TD PT72
MEDICATED_PATCH | TRANSDERMAL | Status: AC
  Filled 2014-03-21: qty 1

## 2014-03-21 MED ORDER — LIDOCAINE HCL (PF) 1 % IJ SOLN: 30.0000 mL | INTRAMUSCULAR | Status: DC | PRN

## 2014-03-21 MED ORDER — IBUPROFEN 600 MG PO TABS: 600.0000 mg | ORAL_TABLET | Freq: Four times a day (QID) | ORAL | Status: DC | PRN

## 2014-03-21 MED ORDER — ONDANSETRON HCL 4 MG/2ML IJ SOLN
INTRAMUSCULAR | Status: AC
  Filled 2014-03-21: qty 2

## 2014-03-21 MED ORDER — PHENYLEPHRINE 40 MCG/ML (10ML) SYRINGE FOR IV PUSH (FOR BLOOD PRESSURE SUPPORT)
PREFILLED_SYRINGE | INTRAVENOUS | Status: AC
  Filled 2014-03-21: qty 5

## 2014-03-21 MED ORDER — HYDRALAZINE HCL 20 MG/ML IJ SOLN
10.0000 mg | Freq: Once | INTRAMUSCULAR | Status: AC
  Administered 2014-03-21: 10 mg via INTRAVENOUS
  Filled 2014-03-21: qty 1

## 2014-03-21 MED ORDER — LACTATED RINGERS IV SOLN
INTRAVENOUS | Status: DC | PRN
  Administered 2014-03-21: 21:00:00 via INTRAVENOUS

## 2014-03-21 MED ORDER — BUPIVACAINE HCL (PF) 0.5 % IJ SOLN
INTRAMUSCULAR | Status: AC
  Filled 2014-03-21: qty 30

## 2014-03-21 MED ORDER — MEPERIDINE HCL 25 MG/ML IJ SOLN
6.2500 mg | INTRAMUSCULAR | Status: DC | PRN
Start: 2014-03-21 — End: 2014-03-21

## 2014-03-21 MED ORDER — ONDANSETRON HCL 4 MG/2ML IJ SOLN: 4.0000 mg | Freq: Four times a day (QID) | INTRAMUSCULAR | Status: DC | PRN

## 2014-03-21 MED ORDER — HYDRALAZINE HCL 20 MG/ML IJ SOLN
10.0000 mg | INTRAMUSCULAR | Status: DC | PRN
  Administered 2014-03-21: 17:00:00 via INTRAVENOUS
  Filled 2014-03-21: qty 1

## 2014-03-21 MED ORDER — KETOROLAC TROMETHAMINE 30 MG/ML IJ SOLN
INTRAMUSCULAR | Status: DC
Start: 2014-03-21 — End: 2014-03-22
  Filled 2014-03-21: qty 1

## 2014-03-21 MED ORDER — OXYMETAZOLINE HCL 0.05 % NA SOLN
1.0000 | Freq: Two times a day (BID) | NASAL | Status: DC
  Administered 2014-03-21 – 2014-03-22 (×2): 1 via NASAL
  Filled 2014-03-21: qty 15

## 2014-03-21 MED ORDER — KETOROLAC TROMETHAMINE 30 MG/ML IJ SOLN
30.0000 mg | Freq: Four times a day (QID) | INTRAMUSCULAR | Status: DC | PRN
Start: 2014-03-21 — End: 2014-03-22

## 2014-03-21 MED ORDER — PHENYLEPHRINE HCL 10 MG/ML IJ SOLN
INTRAMUSCULAR | Status: DC | PRN
  Administered 2014-03-21 (×2): 80 ug via INTRAVENOUS

## 2014-03-21 MED ORDER — SCOPOLAMINE 1 MG/3DAYS TD PT72
1.0000 | MEDICATED_PATCH | Freq: Once | TRANSDERMAL | Status: DC
  Administered 2014-03-21: 1.5 mg via TRANSDERMAL

## 2014-03-21 MED ORDER — BUPIVACAINE HCL (PF) 0.5 % IJ SOLN
INTRAMUSCULAR | Status: DC | PRN
  Administered 2014-03-21: 30 mL

## 2014-03-21 MED ORDER — MEPERIDINE HCL 25 MG/ML IJ SOLN: 6.2500 mg | INTRAMUSCULAR | Status: DC | PRN

## 2014-03-21 MED ORDER — KETOROLAC TROMETHAMINE 30 MG/ML IJ SOLN
30.0000 mg | Freq: Four times a day (QID) | INTRAMUSCULAR | Status: DC | PRN
  Administered 2014-03-21: 30 mg via INTRAVENOUS

## 2014-03-21 MED ORDER — TERBUTALINE SULFATE 1 MG/ML IJ SOLN
0.2500 mg | Freq: Once | INTRAMUSCULAR | Status: DC | PRN
  Filled 2014-03-21: qty 1

## 2014-03-21 MED ORDER — OXYTOCIN 10 UNIT/ML IJ SOLN
INTRAMUSCULAR | Status: AC
Start: 2014-03-21 — End: 2014-03-21
  Filled 2014-03-21: qty 4

## 2014-03-21 MED ORDER — FENTANYL CITRATE 0.05 MG/ML IJ SOLN
INTRAMUSCULAR | Status: AC
  Filled 2014-03-21: qty 2

## 2014-03-21 MED ORDER — MORPHINE SULFATE 0.5 MG/ML IJ SOLN
INTRAMUSCULAR | Status: AC
  Filled 2014-03-21: qty 10

## 2014-03-21 MED ORDER — FENTANYL CITRATE 0.05 MG/ML IJ SOLN
INTRAMUSCULAR | Status: DC | PRN
  Administered 2014-03-21: 15 ug via INTRATHECAL

## 2014-03-21 MED ORDER — ACETAMINOPHEN 325 MG PO TABS
650.0000 mg | ORAL_TABLET | ORAL | Status: DC | PRN
  Administered 2014-03-21: 650 mg via ORAL
  Filled 2014-03-21: qty 2

## 2014-03-21 SURGICAL SUPPLY — 36 items
BENZOIN TINCTURE PRP APPL 2/3 (GAUZE/BANDAGES/DRESSINGS) ×3 IMPLANT
CLAMP CORD UMBIL (MISCELLANEOUS) IMPLANT
CLOSURE WOUND 1/2 X4 (GAUZE/BANDAGES/DRESSINGS) ×1
CLOTH BEACON ORANGE TIMEOUT ST (SAFETY) ×3 IMPLANT
DRAPE LG THREE QUARTER DISP (DRAPES) IMPLANT
DRSG OPSITE POSTOP 4X10 (GAUZE/BANDAGES/DRESSINGS) ×3 IMPLANT
DURAPREP 26ML APPLICATOR (WOUND CARE) ×3 IMPLANT
ELECT REM PT RETURN 9FT ADLT (ELECTROSURGICAL) ×3
ELECTRODE REM PT RTRN 9FT ADLT (ELECTROSURGICAL) ×1 IMPLANT
EXTRACTOR VACUUM M CUP 4 TUBE (SUCTIONS) IMPLANT
EXTRACTOR VACUUM M CUP 4' TUBE (SUCTIONS)
GLOVE BIOGEL PI IND STRL 7.0 (GLOVE) ×1 IMPLANT
GLOVE BIOGEL PI INDICATOR 7.0 (GLOVE) ×2
GLOVE ECLIPSE 7.0 STRL STRAW (GLOVE) ×3 IMPLANT
GOWN STRL REUS W/TWL LRG LVL3 (GOWN DISPOSABLE) ×6 IMPLANT
KIT ABG SYR 3ML LUER SLIP (SYRINGE) IMPLANT
NEEDLE HYPO 22GX1.5 SAFETY (NEEDLE) ×3 IMPLANT
NEEDLE HYPO 25X5/8 SAFETYGLIDE (NEEDLE) ×3 IMPLANT
NS IRRIG 1000ML POUR BTL (IV SOLUTION) ×3 IMPLANT
PACK C SECTION WH (CUSTOM PROCEDURE TRAY) ×3 IMPLANT
PAD ABD 7.5X8 STRL (GAUZE/BANDAGES/DRESSINGS) ×3 IMPLANT
PAD OB MATERNITY 4.3X12.25 (PERSONAL CARE ITEMS) ×3 IMPLANT
RTRCTR C-SECT PINK 25CM LRG (MISCELLANEOUS) ×3 IMPLANT
STAPLER VISISTAT 35W (STAPLE) IMPLANT
STRIP CLOSURE SKIN 1/2X4 (GAUZE/BANDAGES/DRESSINGS) ×2 IMPLANT
SUT PDS AB 0 CT1 27 (SUTURE) IMPLANT
SUT PDS AB 0 CTX 36 PDP370T (SUTURE) IMPLANT
SUT PLAIN 2 0 XLH (SUTURE) IMPLANT
SUT VIC AB 0 CT1 36 (SUTURE) ×6 IMPLANT
SUT VIC AB 0 CTX 36 (SUTURE) ×4
SUT VIC AB 0 CTX36XBRD ANBCTRL (SUTURE) ×2 IMPLANT
SUT VIC AB 4-0 KS 27 (SUTURE) ×3 IMPLANT
SYR CONTROL 10ML LL (SYRINGE) ×3 IMPLANT
TOWEL OR 17X24 6PK STRL BLUE (TOWEL DISPOSABLE) ×3 IMPLANT
TRAY FOLEY CATH 14FR (SET/KITS/TRAYS/PACK) ×3 IMPLANT
WATER STERILE IRR 1000ML POUR (IV SOLUTION) IMPLANT

## 2014-03-21 NOTE — Progress Notes (Signed)
BP elevated pt informed nurse that she had just returned from bathroom.  RN to reassess after pt have been sitting in high fowlers.

## 2014-03-21 NOTE — Transfer of Care (Signed)
Immediate Anesthesia Transfer of Care Note  Patient: Marissa Meyer  Procedure(s) Performed: Procedure(s): CESAREAN SECTION (N/A)  Patient Location: PACU  Anesthesia Type:Spinal  Level of Consciousness: awake, alert  and oriented  Airway & Oxygen Therapy: Patient Spontanous Breathing and Patient connected to nasal cannula oxygen  Post-op Assessment: Report given to PACU RN and Post -op Vital signs reviewed and stable  Post vital signs: Reviewed and stable  Complications: No apparent anesthesia complications

## 2014-03-21 NOTE — Progress Notes (Signed)
Patient ID: Marissa Meyer, female   DOB: 1981-01-12, 33 y.o.   MRN: [id number] [place]) NOTE  Marissa Meyer is a 33 y.o. [id number] at [redacted]w[redacted]d by LMP who is admitted for hypertension exacerbation and r/o super imposed preeclampsia.  .   Fetal presentation is cephalic. Length of Stay:  4  Days   Subjective: No headache or visual changes Patient reports the fetal movement as active. Patient reports uterine contraction  activity as none. Patient reports  vaginal bleeding as none. Patient describes fluid per vagina as None.  Vitals:  Blood pressure 161/79, pulse 93, temperature 98.2 F (36.8 C), temperature source Oral, resp. rate 20, height 4\' 9"  (1.448 m), weight 154 lb 6.4 oz (70.035 kg), last menstrual period 07/25/2013, SpO2 99.00%. Physical Examination:  General appearance - alert, well appearing, and in no distress Heart - normal rate and regular rhythm Abdomen - soft, nontender, nondistended Fundal Height:  size equals dates Cervical Exam: Not evaluated.  Extremities: extremities normal, atraumatic, 1+ edema Membranes:intact  Fetal Monitoring:  Baseline: 140 bpm, Variability: Good {> 6 bpm), Accelerations: 10x10 and Decelerations: Absent  Labs:  Results for orders placed during the hospital encounter of 03/17/14 (from the past 24 hour(s))  PROTEIN / CREATININE RATIO, URINE   Collection Time    03/20/14  4:09 PM      Result Value Ref Range   Creatinine, Urine 45.33     Total Protein, Urine 4.3     PROTEIN CREATININE RATIO 0.09  0.00 - 0.15  COMPREHENSIVE METABOLIC PANEL   Collection Time    03/20/14  4:25 PM      Result Value Ref Range   Sodium 138  137 - 147 mEq/L   Potassium 3.9  3.7 - 5.3 mEq/L   Chloride 103  96 - 112 mEq/L   CO2 25  19 - 32 mEq/L   Glucose, Bld 101 (*) 70 - 99 mg/dL   BUN 9  6 - 23 mg/dL   Creatinine, Ser 0.87  0.50 - 1.10 mg/dL   Calcium 8.3 (*) 8.4 - 10.5 mg/dL   Total Protein 5.4 (*) 6.0 - 8.3 g/dL   Albumin  2.3 (*) 3.5 - 5.2 g/dL   AST 20  0 - 37 U/L   ALT 31  0 - 35 U/L   Alkaline Phosphatase 110  39 - 117 U/L   Total Bilirubin 0.3  0.3 - 1.2 mg/dL   GFR calc non Af Amer 87 (*) >90 mL/min   GFR calc Af Amer >90  >90 mL/min   Anion gap 10  5 - 15  CBC   Collection Time    03/20/14  4:25 PM      Result Value Ref Range   WBC 9.8  4.0 - 10.5 K/uL   RBC 3.08 (*) 3.87 - 5.11 MIL/uL   Hemoglobin 9.0 (*) 12.0 - 15.0 g/dL   HCT 27.8 (*) 36.0 - 46.0 %   MCV 90.3  78.0 - 100.0 fL   MCH 29.2  26.0 - 34.0 pg   MCHC 32.4  30.0 - 36.0 g/dL   RDW 13.3  11.5 - 15.5 %   Platelets 165  150 - 400 K/uL  TYPE AND SCREEN   Collection Time    03/20/14  4:25 PM      Result Value Ref Range   ABO/RH(D) O POS     Antibody Screen NEG     Sample Expiration 03/23/2014      Imaging  Studies:     Currently EPIC will not allow sonographic studies to automatically populate into notes.  In the meantime, copy and paste results into note or free text.  Medications:  Scheduled . docusate sodium  100 mg Oral Daily  . famotidine  20 mg Oral BID  . hydrALAZINE  10 mg Intravenous Once  . labetalol  400 mg Oral Q8H  . labetalol  20 mg Intravenous Once  . ondansetron (ZOFRAN) IV  4 mg Intravenous Once  . ondansetron  4 mg Oral Once  . potassium chloride  20 mEq Oral BID  . prenatal multivitamin  1 tablet Oral Q1200  . sodium chloride  3 mL Intravenous Q12H   I have reviewed the patient's current medications. Dopplers 7/9/normal ASSESSMENT: Patient Active Problem List   Diagnosis Date Noted  . Elevated blood pressure complicating pregnancy, antepartum 03/18/2014  . Hypertension affecting pregnancy in third trimester, antepartum 03/17/2014  . 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 Insufficient prenatal care in third trimester 03/13/2014  . Fetal growth restriction 03/13/2014  . Chronic hypertension, antepartum 02/20/2014  . Late prenatal care starting at 30 weeks 02/20/2014  .  History of preterm labor  02/20/2014  . Tobacco use in pregnancy, antepartum 02/20/2014  . Hypertension     PLAN: Monitor for s/sx severe preeclampsia  Richar Dunklee 03/21/2014,7:43 AM

## 2014-03-21 NOTE — Progress Notes (Signed)
03/21/14@1928 - Notified Dr Jillyn Hidden of patient status, O2 Sats as low as 83, patient c/o of difficulty breathing, health history including cocaine use, HTN, IOL for preeclampsia, gest age, hx of PPH, and FHR tracing, and interventions provided.  Orders given for ABGs.

## 2014-03-21 NOTE — Progress Notes (Signed)
03/21/14 1300  Clinical Encounter Type  Visited With Patient and family together (friend Koran (spelling?))  Visit Type Spiritual support;Social support  Referral From Nurse Bronson Curb, RN)  Spiritual Encounters  Spiritual Needs Emotional  Stress Factors  Patient Stress Factors Loss of control;Financial concerns;Family relationships;Lack of caregivers   Referred by RN for social and emotional support, given Brittnye's complex social situation.  Per pt, she lives with mom, with whom she has a complicated relationship, but she longs for her own housing and to have a job again for independence.  Per pt, her children are 4 and almost 10; 4yo will be in pre-K this fall, which may give her some time flexibility once new baby is old enough for daycare.  Provided pastoral presence, reflective listening, clarification of goals, naming of strengths, and encouragement. At this time she is drowsy and desires rest.   Shevonne is aware of ongoing chaplain availability, including night/weekend support; please page as needed.  Thank you.  [place], [place]

## 2014-03-21 NOTE — Progress Notes (Signed)
Marissa Meyer is a 33 y.o. [phone] at [redacted]w[redacted]d admitted for cHTN with severe BP and HA.   Subjective:  Started having trouble breathing. Sitting straight up seems to help some.  Feeling some pain still with ctx. +FM.    Objective: BP 129/73  Pulse 97  Temp(Src) 98.2 F (36.8 C) (Axillary)  Resp 18  Ht 4\' 9"  (1.448 m)  Wt 70.67 kg (155 lb 12.8 oz)  BMI 33.71 kg/m2  SpO2 99%  LMP 07/25/2013 I/O last 3 completed shifts: In: 3492.8 [P.O.:1600; I.V.:1758.8; IV Piggyback:134] Out: 1540 [Urine:3550]    FHT:  FHR: 135 bpm, variability: minimal ,  accelerations:  Abscent,  decelerations:  Present variables UC:   irregular, every 1-5 minutes SVE:   Dilation: 1 Effacement (%): Thick Station: -3 Exam by:: Dr Olevia Bowens  Labs: Lab Results  Component Value Date   WBC 12.3* 03/21/2014   HGB 9.5* 03/21/2014   HCT 29.0* 03/21/2014   MCV 90.9 03/21/2014   PLT 170 03/21/2014    Assessment / Plan: IOL for cHTN with severe range pressure and IUGR  Labor: FB in place.  SOB: bibasilar crackles and decreased air movement. No wheezes or rhonchi. Tachypnea. Now on nonrebreather at 15L and sating 96%. Will decrease mag to 1mg /hr and check mag level and CBC.  ABG and CXR also obtained.  Preeclampsia:  see above Fetal Wellbeing:  Category II Pain Control:  Labor support without medications I/D:  n/a Anticipated MOD:  remote from delivery and now with potential signs of toxicity. pt was consented for c/s but will await results of labs and cxr prior to making a decision.    The risks of cesarean section discussed with the patient included but were not limited to: bleeding which may require transfusion or reoperation; infection which may require antibiotics; injury to bowel, bladder, ureters or other surrounding organs; injury to the fetus; need for additional procedures including hysterectomy in the event of a life-threatening hemorrhage; placental abnormalities wth subsequent pregnancies, incisional problems,  thromboembolic phenomenon and other postoperative/anesthesia complications. The patient concurred with the proposed plan, giving informed written consent for the procedure.   Patient has been NPO since before midnight she will remain NPO for procedure. Anesthesia and OR aware. Preoperative prophylactic antibiotics and SCDs ordered on call to the OR.  To OR when ready.  Adynn Caseres L 03/21/2014, 7:42 PM

## 2014-03-21 NOTE — Op Note (Signed)
[name] PROCEDURE DATE: 03/21/2014  PREOPERATIVE DIAGNOSES: Intrauterine pregnancy at [redacted]w[redacted]d weeks gestation; CHTN with superimposed severe preeclampsia; IUGR; repetitive fetal hear rate decelerations; pulmonary edema  POSTOPERATIVE DIAGNOSES: The same  PROCEDURE: Primary Low Transverse Cesarean Section  SURGEON:  Dr. Verita Schneiders  ASSISTANT:  Dr. Ebbie Latus  ANESTHESIOLOGIST: Dr. Assunta Gambles  INDICATIONS: Marissa Meyer is a 33 y.o. [phone] at [redacted]w[redacted]d here for cesarean section secondary to the indications listed under preoperative diagnoses; please see preoperative note for further details.  The risks of cesarean section were discussed with the patient including but were not limited to: bleeding which may require transfusion or reoperation; infection which may require antibiotics; injury to bowel, bladder, ureters or other surrounding organs; injury to the fetus; need for additional procedures including hysterectomy in the event of a life-threatening hemorrhage; placental abnormalities wth subsequent pregnancies, incisional problems, thromboembolic phenomenon and other postoperative/anesthesia complications.   The patient concurred with the proposed plan, giving informed written consent for the procedure.    FINDINGS:  Viable female infant in cephalic presentation.  Apgars 8 and 9, arterial cord pH 7.22, weight 3 lbs 14 oz.  Clear amniotic fluid.  Intact placenta, three vessel cord.  Normal uterus, fallopian tubes and ovaries bilaterally.  ANESTHESIA: Spinal INTRAVENOUS FLUIDS: 500 ml ESTIMATED BLOOD LOSS: 600 ml URINE OUTPUT:  450 ml SPECIMENS: Placenta sent to pathology COMPLICATIONS: None immediate  PROCEDURE IN DETAIL:  The patient preoperatively received intravenous antibiotics and had sequential compression devices applied to her lower extremities.  She was then taken to the operating room where spinal anesthesia was administered and was found to be adequate. She was then placed in  a dorsal supine position with a leftward tilt, and prepped and draped in a sterile manner.  A foley catheter was placed into her bladder and attached to constant gravity.  After an adequate timeout was performed, a Pfannenstiel skin incision was made with scalpel and carried through to the underlying layer of fascia. The fascia was incised in the midline, and this incision was extended bilaterally using the Mayo scissors.  Kocher clamps were applied to the superior aspect of the fascial incision and the underlying rectus muscles were dissected off bluntly. A similar process was carried out on the inferior aspect of the fascial incision. The rectus muscles were separated in the midline bluntly and the peritoneum was entered bluntly. Attention was turned to the lower uterine segment where a low transverse hysterotomy was made with a scalpel and extended bilaterally bluntly.  The infant was successfully delivered, the cord was clamped and cut and the infant was handed over to awaiting neonatology team. Uterine massage was then administered, and the placenta delivered intact with a three-vessel cord. The uterus was then cleared of clot and debris.  The hysterotomy was closed with 0 Vicryl in a running locked fashion, and an imbricating layer was also placed with 0 Vicryl. The pelvis was cleared of all clot and debris. Hemostasis was confirmed on all surfaces.  The peritoneum and the muscles were reapproximated using 0 Vicryl interrupted stitches. The fascia was then closed using 0 Vicryl in a running fashion.  The subcutaneous layer was irrigated and 30 ml of 0.5% Marcaine was injected subcutaneously around the incision.  The skin was closed with a 4-0 Vicryl subcuticular stitch. The patient tolerated the procedure well. Sponge, lap, instrument and needle counts were correct x 2.  She was taken to the recovery room in stable condition.    Airabella Barley  Harolyn Rutherford, MD, [place] Attending [place], [facility]

## 2014-03-21 NOTE — Progress Notes (Signed)
Dr Harolyn Rutherford notified of patient BPs, admin of Labetalol X3, patient c/o HA, FHr tracing, UCs, and last dates of labs.  Orders to begin Mag Sulfate, Hydralazine, and labs. Plan to transfer to L&D for IOL

## 2014-03-21 NOTE — Progress Notes (Signed)
Marissa Meyer is a 33 y.o. [phone] at [redacted]w[redacted]d admitted for cHTN with severe BP and HA.   Subjective:  Still having trouble breathing. CXR done and is concerning for significant pulmonary edema. She is on 15 L on oxygen.  Magnesium sulfate turned off at the moment.  Objective: BP 135/83  Pulse 98  Temp(Src) 98.2 F (36.8 C) (Axillary)  Resp 24  Ht 4\' 9"  (1.448 m)  Wt 155 lb 12.8 oz (70.67 kg)  BMI 33.71 kg/m2  SpO2 83%  LMP 07/25/2013 I/O last 3 completed shifts: In: 3492.8 [P.O.:1600; I.V.:1758.8; IV Piggyback:134] Out: 2409 [Urine:3550] Total I/O In: 18.1 [I.V.:18.1] Out: 600 [Urine:600]  FHT:  FHR: 135 bpm, variability: minimal ,  accelerations:  Abscent,  decelerations:  Present variables UC:   irregular, every 1-5 minutes SVE:   Dilation: 1 Effacement (%): Thick Station: -3 Exam by:: Dr Hal Neer bulb removed Labs:  Results for orders placed during the hospital encounter of 03/17/14 (from the past 24 hour(s))  RPR     Status: None   Collection Time    03/21/14 11:30 AM      Result Value Ref Range   RPR NON REAC  NON REAC  COMPREHENSIVE METABOLIC PANEL     Status: Abnormal   Collection Time    03/21/14 11:30 AM      Result Value Ref Range   Sodium 138  137 - 147 mEq/L   Potassium 4.0  3.7 - 5.3 mEq/L   Chloride 103  96 - 112 mEq/L   CO2 23  19 - 32 mEq/L   Glucose, Bld 103 (*) 70 - 99 mg/dL   BUN 10  6 - 23 mg/dL   Creatinine, Ser 0.81  0.50 - 1.10 mg/dL   Calcium 9.2  8.4 - 10.5 mg/dL   Total Protein 5.3 (*) 6.0 - 8.3 g/dL   Albumin 2.4 (*) 3.5 - 5.2 g/dL   AST 25  0 - 37 U/L   ALT 33  0 - 35 U/L   Alkaline Phosphatase 115  39 - 117 U/L   Total Bilirubin <0.2 (*) 0.3 - 1.2 mg/dL   GFR calc non Af Amer >90  >90 mL/min   GFR calc Af Amer >90  >90 mL/min   Anion gap 12  5 - 15  PROTEIN / CREATININE RATIO, URINE     Status: Abnormal   Collection Time    03/21/14 11:30 AM      Result Value Ref Range   Creatinine, Urine 19.72     Total Protein, Urine 4      PROTEIN CREATININE RATIO 0.20 (*) 0.00 - 0.15  CBC     Status: Abnormal   Collection Time    03/21/14 12:00 PM      Result Value Ref Range   WBC 12.3 (*) 4.0 - 10.5 K/uL   RBC 3.19 (*) 3.87 - 5.11 MIL/uL   Hemoglobin 9.5 (*) 12.0 - 15.0 g/dL   HCT 29.0 (*) 36.0 - 46.0 %   MCV 90.9  78.0 - 100.0 fL   MCH 29.8  26.0 - 34.0 pg   MCHC 32.8  30.0 - 36.0 g/dL   RDW 13.4  11.5 - 15.5 %   Platelets 170  150 - 400 K/uL  MAGNESIUM     Status: Abnormal   Collection Time    03/21/14  7:33 PM      Result Value Ref Range   Magnesium 5.3 (*) 1.5 - 2.5 mg/dL  CBC     Status: Abnormal   Collection Time    03/21/14  7:33 PM      Result Value Ref Range   WBC 14.3 (*) 4.0 - 10.5 K/uL   RBC 3.37 (*) 3.87 - 5.11 MIL/uL   Hemoglobin 10.0 (*) 12.0 - 15.0 g/dL   HCT 30.2 (*) 36.0 - 46.0 %   MCV 89.6  78.0 - 100.0 fL   MCH 29.7  26.0 - 34.0 pg   MCHC 33.1  30.0 - 36.0 g/dL   RDW 13.5  11.5 - 15.5 %   Platelets 169  150 - 400 K/uL  BLOOD GAS, ARTERIAL     Status: Abnormal   Collection Time    03/21/14  7:34 PM      Result Value Ref Range   FIO2 1.00     O2 Content 15.0     Delivery systems NON-REBREATHER OXYGEN MASK     pH, Arterial 7.436  7.350 - 7.450   pCO2 arterial 32.2 (*) 35.0 - 45.0 mmHg   pO2, Arterial 85.5  80.0 - 100.0 mmHg   Bicarbonate 21.3  20.0 - 24.0 mEq/L   TCO2 22.2  0 - 100 mmol/L   Acid-base deficit 1.9  0.0 - 2.0 mmol/L   O2 Saturation 98.0     Collection site RADIAL     Drawn by [phone]     Sample type ARTERIAL     Allens test (pass/fail) PASS  PASS  PREPARE RBC (CROSSMATCH)     Status: None   Collection Time    03/21/14  9:00 PM      Result Value Ref Range   Order Confirmation ORDER PROCESSED BY BLOOD BANK     03/21/14 CXR Diffuse pulmonary opacities bilaterally concerning for severe pulmonary edema  Assessment / Plan: IOL for cHTN with severe range pressure and IUGR. Will proceed with cesarean delivery as planned. Lasix 20 mg IV x 1 given now.  To OR when ready,  consent already signed.    Osborne Oman, MD 03/21/2014, 8:41 PM

## 2014-03-21 NOTE — Care Management Note (Unsigned)
    Page 1 of 1   03/21/2014     4:28:35 PM CARE MANAGEMENT NOTE 03/21/2014  Patient:  Marissa Meyer, Marissa Meyer   Account Number:  [id number]  Date Initiated:  03/21/2014  Documentation initiated by:  CRAFT,TERRI  Subjective/Objective Assessment:   33 year old female admitted 03/17/14 at 33 weeks with dizziness, headache, and weakness     Action/Plan:   D/C when medically stable   Anticipated DC Date:  03/24/2014   Anticipated DC Plan:  [place] referral  Clinical Social Worker      DC Planning Services  CM consult            Status of service:  In process, will continue to follow  Comments:  03/21/14, Aida Raider RNC-MNN, BSN, [phone],  CM received medication assistance consult.  Pt has Medicaid and is not eligible for medication assistance.  CM will refer to CSW for transportation issues.

## 2014-03-21 NOTE — Anesthesia Postprocedure Evaluation (Signed)
  Anesthesia Post-op Note  Anesthesia Post Note  Patient: Marissa Meyer  Procedure(s) Performed: Procedure(s) (LRB): CESAREAN SECTION (N/A)  Anesthesia type: Spinal  Patient location: PACU  Post pain: Pain level controlled  Post assessment: Post-op Vital signs reviewed  Last Vitals:  Filed Vitals:   03/21/14 2245  BP: 107/76  Pulse: 90  Temp:   Resp: 35    Post vital signs: Reviewed  Level of consciousness: awake  Complications: No apparent anesthesia complications

## 2014-03-21 NOTE — Progress Notes (Signed)
CSW notified CPS worker/A. T[place]air (on 03/18/14) to inform her that patient was readmitted to the hospital.  [place] contacted [place] worker today to inform her that patient is being induced today.

## 2014-03-21 NOTE — Progress Notes (Signed)
Marissa Meyer is a 33 y.o. [phone] at [redacted]w[redacted]d admitted for elevated BP and headache in the setting of chronic HTN.    Subjective: Called by nursing for persistent severe range bp not responding to medication. +FM. Pt complaining of HA.    Objective: BP 181/94  Pulse 88  Temp(Src) 98.2 F (36.8 C) (Oral)  Resp 18  Ht 4\' 9"  (1.448 m)  Wt 70.035 kg (154 lb 6.4 oz)  BMI 33.40 kg/m2  SpO2 99%  LMP 07/25/2013 I/O last 3 completed shifts: In: 1094 [P.O.:960; IV Piggyback:134] Out: 2050 [Urine:2050]    FHT:  FHR: 140 bpm, variability: min to mod,  accelerations:  Present,  decelerations:  Absent UC:   Irregular, occasional   SVE:   Dilation: 1 Effacement (%): 50 Station: -3 Exam by:: ginger morris rn  Labs: Lab Results  Component Value Date   WBC 9.8 03/20/2014   HGB 9.0* 03/20/2014   HCT 27.8* 03/20/2014   MCV 90.3 03/20/2014   PLT 165 03/20/2014    Assessment / Plan: cHTN with persistent severe BP. She is s/p BMZ x2 and now >34 weeks with IUGR baby. given bp range, will move pt to L&D and induce  Labor: cytotec x1 initially given cervical exam. then will plan for FB if able. currenlty on my exam seems to be closed although difficult to examine Preeclampsia:  will restart mag. give IV hydralazine and labetalol. Recheck labs.  Fetal Wellbeing:  Category I, vtx confirmed on [place]. GBS neg.  Pain Control:  Labor support without medications I/D:  n/a Anticipated MOD:  NSVD  Tayler Lassen L 03/21/2014, 9:21 AM

## 2014-03-21 NOTE — Anesthesia Procedure Notes (Signed)
Spinal  Patient location during procedure: OR Start time: 03/21/2014 9:16 PM Staffing Performed by: anesthesiologist  Preanesthetic Checklist Completed: patient identified, site marked, surgical consent, pre-op evaluation, timeout performed, IV checked, risks and benefits discussed and monitors and equipment checked Spinal Block Patient position: sitting Prep: site prepped and draped and DuraPrep Patient monitoring: heart rate, cardiac monitor, continuous pulse ox and blood pressure Approach: midline Location: L3-4 Injection technique: single-shot Needle Needle type: Pencan  Needle gauge: 24 G Needle length: 9 cm Assessment Sensory level: T4 Additional Notes Clear free flow CSF on first attempt. No paresthesia.  Patient tolerated procedure well with no apparent complications.  Charlton Haws, MD

## 2014-03-21 NOTE — Anesthesia Preprocedure Evaluation (Addendum)
Anesthesia Evaluation  Patient identified by MRN, date of birth, ID band Patient awake    Reviewed: Allergy & Precautions, H&P , NPO status , Patient's Chart, lab work & pertinent test results, reviewed documented beta blocker date and time   History of Anesthesia Complications Negative for: history of anesthetic complications  Airway Mallampati: III TM Distance: >3 FB Neck ROM: full    Dental  (+) Missing, Edentulous Upper,    Pulmonary Current Smoker, former smoker,  Currently SOB, desatting, on NRB O2.  ABG 7.44/32/88/98%.  CXR pending breath sounds clear to auscultation        Cardiovascular hypertension (CHTN, superimposed severe preeslampsia, on magnesium), On Home Beta Blockers Rhythm:regular Rate:Normal     Neuro/Psych  Headaches,    GI/Hepatic GERD-  ,(+)     substance abuse (snorts coke - last use 03/13/14)  cocaine use,   Endo/Other    Renal/GU   Female GU complaint     Musculoskeletal   Abdominal   Peds  Hematology  (+) anemia , H/o PPH, required 7 units transfusion   Anesthesia Other Findings   Reproductive/Obstetrics (+) Pregnancy (late [place], CHTN with superimposed severe preeclampsia, 34 weeks)                          Anesthesia Physical Anesthesia Plan  ASA: III and emergent  Anesthesia Plan: Spinal   Post-op Pain Management:    Induction:   Airway Management Planned:   Additional Equipment:   Intra-op Plan:   Post-operative Plan:   Informed Consent: I have reviewed the patients History and Physical, chart, labs and discussed the procedure including the risks, benefits and alternatives for the proposed anesthesia with the patient or authorized representative who has indicated his/her understanding and acceptance.     Plan Discussed with: Surgeon and CRNA  Anesthesia Plan Comments:         Anesthesia Quick Evaluation

## 2014-03-21 NOTE — Progress Notes (Signed)
Marissa Meyer is a 33 y.o. [phone] at [redacted]w[redacted]d admitted for elevated bp and HA in the setting of chronic HTN.   Subjective:  Contracting more on cytotec. Feels more crampy. +FM.    Objective: BP 144/81  Pulse 94  Temp(Src) 98.2 F (36.8 C) (Oral)  Resp 18  Ht 4\' 9"  (1.448 m)  Wt 70.67 kg (155 lb 12.8 oz)  BMI 33.71 kg/m2  SpO2 99%  LMP 07/25/2013 I/O last 3 completed shifts: In: 1094 [P.O.:960; IV Piggyback:134] Out: 2050 [Urine:2050] Total I/O In: 983.8 [P.O.:350; I.V.:633.8] Out: 900 [Urine:900]  FHT:  FHR: 135 bpm, variability: minimal ,  accelerations:  Present,  decelerations:  Present occasional early UC:   irregular, every 2-7 minutes SVE:   Dilation: 1 Effacement (%): Thick Station: -3 Exam by:: L. Mcdaniel RN  Labs: Lab Results  Component Value Date   WBC 12.3* 03/21/2014   HGB 9.5* 03/21/2014   HCT 29.0* 03/21/2014   MCV 90.9 03/21/2014   PLT 170 03/21/2014    Assessment / Plan: Induction of labor due to severe BP and cHTN, unfavorable cervix.   Labor: fb placed with significant difficulty.  1/thick/high Preeclampsia:  on magnesium sulfate, no signs or symptoms of toxicity, intake and ouput balanced and labs stable Fetal Wellbeing:  Category II Pain Control:  Labor support without medications I/D:  neg Anticipated MOD:  NSVD  Kerrington Greenhalgh L 03/21/2014, 4:36 PM

## 2014-03-21 NOTE — Progress Notes (Signed)
During admission to labor and delivery, discussed with patient several concerns regarding personal social situation. 1. Expressed previous delivery experiences with post partum depression. During each of these periods, patient expressed feeling hopeless, depressed and had suicidal thoughts. With both previous pregnancies, the patient did not express these feelings to a medical provider. When asked of current feelings, patient states, "I have no feelings of hurting myself now, especially since I have better support. But I have had some of those feelings earlier in my pregnancy." 2. Patient mentioned the difficulty in being inpatient because she was concerned about her children. Currently, they are staying with her mother, whom she lives with. However, the patient stated that her mother participates in smoking crack on a regular basis and has since she (the patient) was a child. She is concerned about the children staying with her as she was not a good support person.  3. Admitted to cocaine abuse, which she termed a relapse due to current stressors. Patient states, "She feels like a bad mother and wants better for her children." Currently, patient is working with outpatient social worker regarding case for cocaine abuse.  Educated patient on the importance of caring for herself postpartum by monitoring blood pressures and continuing to follow up. Mentioned that there were people at [facility] who may be able to assist in guiding patient towards helpful resources.  Discussed all of these concerns with social work and spiritual care. Orders placed for care management.  Dr Olevia Bowens notified of the concerns mentioned during this discussion.

## 2014-03-22 ENCOUNTER — Inpatient Hospital Stay (HOSPITAL_COMMUNITY): Payer: Medicaid Other

## 2014-03-22 LAB — CBC
HEMATOCRIT: 26.4 % — AB (ref 36.0–46.0)
Hemoglobin: 8.8 g/dL — ABNORMAL LOW (ref 12.0–15.0)
MCH: 29.9 pg (ref 26.0–34.0)
MCHC: 33.3 g/dL (ref 30.0–36.0)
MCV: 89.8 fL (ref 78.0–100.0)
Platelets: 142 10*3/uL — ABNORMAL LOW (ref 150–400)
RBC: 2.94 MIL/uL — ABNORMAL LOW (ref 3.87–5.11)
RDW: 13.5 % (ref 11.5–15.5)
WBC: 15.5 10*3/uL — ABNORMAL HIGH (ref 4.0–10.5)

## 2014-03-22 LAB — COMPREHENSIVE METABOLIC PANEL
ALK PHOS: 98 U/L (ref 39–117)
ALT: 40 U/L — ABNORMAL HIGH (ref 0–35)
ANION GAP: 9 (ref 5–15)
AST: 40 U/L — ABNORMAL HIGH (ref 0–37)
Albumin: 2.1 g/dL — ABNORMAL LOW (ref 3.5–5.2)
BUN: 9 mg/dL (ref 6–23)
CALCIUM: 8.4 mg/dL (ref 8.4–10.5)
CO2: 26 mEq/L (ref 19–32)
Chloride: 102 mEq/L (ref 96–112)
Creatinine, Ser: 0.81 mg/dL (ref 0.50–1.10)
GFR calc non Af Amer: >90 mL/min (ref >90)
GLUCOSE: 119 mg/dL — AB (ref 70–99)
Potassium: 4.7 mEq/L (ref 3.7–5.3)
Sodium: 137 mEq/L (ref 137–147)
TOTAL PROTEIN: 4.7 g/dL — AB (ref 6.0–8.3)
Total Bilirubin: 0.3 mg/dL (ref 0.3–1.2)

## 2014-03-22 MED ORDER — SALINE SPRAY 0.65 % NA SOLN
1.0000 | NASAL | Status: DC | PRN
  Administered 2014-03-22: 1 via NASAL
  Filled 2014-03-22: qty 44

## 2014-03-22 MED ORDER — NALOXONE HCL 0.4 MG/ML IJ SOLN: 0.4000 mg | INTRAMUSCULAR | Status: DC | PRN

## 2014-03-22 MED ORDER — PRENATAL MULTIVITAMIN CH
1.0000 | ORAL_TABLET | Freq: Every day | ORAL | Status: DC
  Administered 2014-03-22 – 2014-03-24 (×3): 1 via ORAL
  Filled 2014-03-22 (×3): qty 1

## 2014-03-22 MED ORDER — ONDANSETRON HCL 4 MG/2ML IJ SOLN: 4.0000 mg | INTRAMUSCULAR | Status: DC | PRN

## 2014-03-22 MED ORDER — DIPHENHYDRAMINE HCL 25 MG PO CAPS: 25.0000 mg | ORAL_CAPSULE | ORAL | Status: DC | PRN

## 2014-03-22 MED ORDER — LACTATED RINGERS IV SOLN: INTRAVENOUS | Status: DC

## 2014-03-22 MED ORDER — OXYCODONE-ACETAMINOPHEN 5-325 MG PO TABS
1.0000 | ORAL_TABLET | ORAL | Status: DC | PRN
  Filled 2014-03-22: qty 2

## 2014-03-22 MED ORDER — IBUPROFEN 600 MG PO TABS
600.0000 mg | ORAL_TABLET | Freq: Four times a day (QID) | ORAL | Status: DC
  Administered 2014-03-22 – 2014-03-24 (×10): 600 mg via ORAL
  Filled 2014-03-22 (×10): qty 1

## 2014-03-22 MED ORDER — METOCLOPRAMIDE HCL 5 MG/ML IJ SOLN: 10.0000 mg | Freq: Three times a day (TID) | INTRAMUSCULAR | Status: DC | PRN

## 2014-03-22 MED ORDER — NALOXONE HCL 1 MG/ML IJ SOLN
1.0000 ug/kg/h | INTRAVENOUS | Status: DC | PRN
  Filled 2014-03-22: qty 2

## 2014-03-22 MED ORDER — MENTHOL 3 MG MT LOZG: 1.0000 | LOZENGE | OROMUCOSAL | Status: DC | PRN

## 2014-03-22 MED ORDER — SIMETHICONE 80 MG PO CHEW
80.0000 mg | CHEWABLE_TABLET | ORAL | Status: DC | PRN
  Administered 2014-03-22: 80 mg via ORAL
  Filled 2014-03-22 (×2): qty 1

## 2014-03-22 MED ORDER — NALBUPHINE HCL 10 MG/ML IJ SOLN: 5.0000 mg | INTRAMUSCULAR | Status: DC | PRN

## 2014-03-22 MED ORDER — ZOLPIDEM TARTRATE 5 MG PO TABS: 5.0000 mg | ORAL_TABLET | Freq: Every evening | ORAL | Status: DC | PRN

## 2014-03-22 MED ORDER — LABETALOL HCL 200 MG PO TABS
200.0000 mg | ORAL_TABLET | Freq: Two times a day (BID) | ORAL | Status: DC
  Filled 2014-03-22: qty 1

## 2014-03-22 MED ORDER — MAGNESIUM HYDROXIDE 400 MG/5ML PO SUSP
30.0000 mL | ORAL | Status: DC | PRN
  Filled 2014-03-22: qty 30

## 2014-03-22 MED ORDER — ONDANSETRON HCL 4 MG/2ML IJ SOLN: 4.0000 mg | Freq: Three times a day (TID) | INTRAMUSCULAR | Status: DC | PRN

## 2014-03-22 MED ORDER — DIPHENHYDRAMINE HCL 50 MG/ML IJ SOLN
25.0000 mg | INTRAMUSCULAR | Status: DC | PRN
Start: 2014-03-22 — End: 2014-03-24

## 2014-03-22 MED ORDER — DIPHENHYDRAMINE HCL 25 MG PO CAPS: 25.0000 mg | ORAL_CAPSULE | Freq: Four times a day (QID) | ORAL | Status: DC | PRN

## 2014-03-22 MED ORDER — SIMETHICONE 80 MG PO CHEW
80.0000 mg | CHEWABLE_TABLET | ORAL | Status: DC
  Administered 2014-03-22 – 2014-03-23 (×3): 80 mg via ORAL
  Filled 2014-03-22 (×3): qty 1

## 2014-03-22 MED ORDER — DIBUCAINE 1 % RE OINT: 1.0000 "application " | TOPICAL_OINTMENT | RECTAL | Status: DC | PRN

## 2014-03-22 MED ORDER — PNEUMOCOCCAL VAC POLYVALENT 25 MCG/0.5ML IJ INJ
0.5000 mL | INJECTION | INTRAMUSCULAR | Status: DC
  Filled 2014-03-22 (×2): qty 0.5

## 2014-03-22 MED ORDER — ACETAMINOPHEN-CODEINE #3 300-30 MG PO TABS
2.0000 | ORAL_TABLET | ORAL | Status: DC | PRN
  Administered 2014-03-22: 1 via ORAL
  Administered 2014-03-22 – 2014-03-24 (×4): 2 via ORAL
  Filled 2014-03-22 (×5): qty 2

## 2014-03-22 MED ORDER — HYDROCHLOROTHIAZIDE 25 MG PO TABS
25.0000 mg | ORAL_TABLET | Freq: Every day | ORAL | Status: DC
  Administered 2014-03-22 – 2014-03-24 (×3): 25 mg via ORAL
  Filled 2014-03-22 (×3): qty 1

## 2014-03-22 MED ORDER — WITCH HAZEL-GLYCERIN EX PADS: 1.0000 "application " | MEDICATED_PAD | CUTANEOUS | Status: DC | PRN

## 2014-03-22 MED ORDER — LANOLIN HYDROUS EX OINT: 1.0000 "application " | TOPICAL_OINTMENT | CUTANEOUS | Status: DC | PRN

## 2014-03-22 MED ORDER — ONDANSETRON HCL 4 MG PO TABS
4.0000 mg | ORAL_TABLET | ORAL | Status: DC | PRN
  Administered 2014-03-24: 4 mg via ORAL
  Filled 2014-03-22: qty 1

## 2014-03-22 MED ORDER — DIPHENHYDRAMINE HCL 50 MG/ML IJ SOLN: 12.5000 mg | INTRAMUSCULAR | Status: DC | PRN

## 2014-03-22 MED ORDER — MEASLES, MUMPS & RUBELLA VAC ~~LOC~~ INJ
0.5000 mL | INJECTION | Freq: Once | SUBCUTANEOUS | Status: DC
  Filled 2014-03-22: qty 0.5

## 2014-03-22 MED ORDER — TETANUS-DIPHTH-ACELL PERTUSSIS 5-2.5-18.5 LF-MCG/0.5 IM SUSP
0.5000 mL | Freq: Once | INTRAMUSCULAR | Status: AC
  Administered 2014-03-23: 0.5 mL via INTRAMUSCULAR
  Filled 2014-03-22 (×2): qty 0.5

## 2014-03-22 MED ORDER — OXYTOCIN 40 UNITS IN LACTATED RINGERS INFUSION - SIMPLE MED
62.5000 mL/h | INTRAVENOUS | Status: AC
Start: 2014-03-14 — End: 2014-03-15

## 2014-03-22 MED ORDER — SODIUM CHLORIDE 0.9 % IJ SOLN: 3.0000 mL | INTRAMUSCULAR | Status: DC | PRN

## 2014-03-22 MED ORDER — SENNOSIDES-DOCUSATE SODIUM 8.6-50 MG PO TABS
2.0000 | ORAL_TABLET | ORAL | Status: DC
  Administered 2014-03-22 – 2014-03-23 (×3): 2 via ORAL
  Filled 2014-03-22 (×3): qty 2

## 2014-03-22 NOTE — Anesthesia Postprocedure Evaluation (Signed)
  Anesthesia Post-op Note  Patient: Marissa Meyer  Procedure(s) Performed: Procedure(s): CESAREAN SECTION (N/A)  Patient Location: PACU and ICU  Anesthesia Type:Spinal  Level of Consciousness: awake, alert  and oriented  Airway and Oxygen Therapy: Patient Spontanous Breathing  Post-op Pain: none  Post-op Assessment: Post-op Vital signs reviewed, Patient's Cardiovascular Status Stable, Respiratory Function Stable, No headache, No backache, No residual numbness and No residual motor weakness  Post-op Vital Signs: Reviewed and stable  Last Vitals:  Filed Vitals:   03/22/14 0800  BP: 148/89  Pulse: 92  Temp:   Resp:     Complications: No apparent anesthesia complications

## 2014-03-22 NOTE — Addendum Note (Signed)
Addendum created 03/22/14 0834 by Jonna Munro, CRNA   Modules edited: Notes Section   Notes Section:  File: [id number]

## 2014-03-22 NOTE — Lactation Note (Signed)
This note was copied from the chart of [facility]. Lactation Consultation Note  According to [facility] RN Mother would like to provide breastmilk to infant.  Peds to confirm due to maternal drug use. Paige RN will set up DEBP and review usage with mother. Mother has NICU booklet. Provided labels, colostrum vials, soap and bin.   Patient Name: Marissa Meyer [id number] Date: 03/22/2014     Maternal Data    Feeding Feeding Type:  (Infant NPO)  LATCH Score/Interventions                      Lactation Tools Discussed/Used     Consult Status      Vivianne Master [facility] 03/22/2014, 11:28 AM

## 2014-03-22 NOTE — Progress Notes (Signed)
Subjective: Marissa Meyer is a 33 y.o. [phone] at [redacted]w[redacted]d POD1 complicated with Flash pulmonary edema requiring O2 prior to section. Pt given lasix with significant diuresis and now is net neg 600cc. Good UOP overnight starting to taperto 50cc/hr.  Postpartum Day 1: Cesarean Delivery Patient reports incisional pain and tolerating PO.   Foley still in place, Headache resolved, no complaints besides appropriate pain.  Objective: Vital signs in last 24 hours: Temp:  [97.8 F (36.6 C)-98.3 F (36.8 C)] 98.2 F (36.8 C) (07/11 0836) Pulse Rate:  [86-105] 97 (07/11 0900) Resp:  [16-35] 16 (07/11 0700) BP: (90-181)/(67-109) 152/91 mmHg (07/11 0900) SpO2:  [83 %-100 %] 98 % (07/11 0900) Date 03/22/14 0701 - 03/23/14 0700  Shift 0701-1900 1901-0700 24 Hour Total  I N T A K E  Shift Total (mL/kg)     O U T P U T  Urine (mL/kg/hr) 100  100     Urine 100  100   Shift Total (mL/kg) 100 (1.4)  100 (1.4)  NET -100  -100  Weight (kg) 70.7 70.7 70.7     Physical Exam:  General: alert, cooperative, appears stated age and no distress RRR no mgt CTAB but decreased in bases. No w/r/c Lochia: appropriate Abdomen: Nontender, moderately distended, mildly tympanic, +BS Uterine Fundus: firm Incision: no significant drainage, pressure dressing in place DVT Evaluation: No evidence of DVT seen on physical exam. Negative Homan's sign. No cords or calf tenderness. Bilateral equal 3+pitting edema to knee   Recent Labs  03/21/14 1933 03/22/14 0508  HGB 10.0* 8.8*  HCT 30.2* 26.4*    Assessment/Plan: Marissa Meyer is a 33 y.o. [id number] at [redacted]w[redacted]d POD#1 complicated with flash pulmonary edema and Preeclampsia, now on room air Status post Cesarean section. Doing well postoperatively.  - Concern for postop illeus, tolerating PO  - no n/v at this time, will encourage ambulation today, if bowels do not increase, will make NPO and encourage ambulation. Low concern for intrabdominal air  based on no pain with abdomen. Will continue to monitor. Likely contributing to decreased breath sounds - persistent decreased breath sounds - repeat CXR  - if persistent fluid in lungs redose with lasix. - Blood pressures persistently elevated   - continue labetalol, add HCTZ 25mg  qday  - consider transition to CCB once stable  dispo likely transfer to floor tomorrow.  Fredrik Rigger 03/22/2014, 9:46 AM

## 2014-03-22 NOTE — Progress Notes (Signed)
Subjective: Postpartum Day 1: Cesarean Delivery Patient reports tolerating PO and no problems voiding.  Good urine output  Objective: Vital signs in last 24 hours: Temp:  [97.9 F (36.6 C)-98.3 F (36.8 C)] 98.1 F (36.7 C) (07/11 1721) Pulse Rate:  [85-106] 103 (07/11 2121) Resp:  [16-35] 16 (07/11 1721) BP: (90-156)/(69-97) 140/80 mmHg (07/11 2121) SpO2:  [96 %-100 %] 96 % (07/11 2121)  Physical Exam:  General: alert and no distress Lochia: appropriate Uterine Fundus: firm Incision: healing well, no significant drainage DVT Evaluation: No evidence of DVT seen on physical exam.   Recent Labs  03/21/14 1933 03/22/14 0508  HGB 10.0* 8.8*  HCT 30.2* 26.4*    Assessment/Plan: Status post Cesarean section. Doing well postoperatively. resolved edema pulmonary Transfer to 3 rd floor.Jonnie Kind 03/22/2014, 9:44 PM

## 2014-03-23 MED ORDER — LABETALOL HCL 200 MG PO TABS
400.0000 mg | ORAL_TABLET | Freq: Two times a day (BID) | ORAL | Status: DC
  Administered 2014-03-23 – 2014-03-24 (×3): 400 mg via ORAL
  Filled 2014-03-23 (×3): qty 2

## 2014-03-23 NOTE — Progress Notes (Signed)
Subjective: Postpartum Day 2: Cesarean Delivery for severe range BP's, IOL due to Flash pulmonary edema while in induced labor for severe Pre-E   Patient reports + flatus and no problems voiding.  Minimal pain. BC DEPO  Objective: Vital signs in last 24 hours: Temp:  [98.1 F (36.7 C)-98.7 F (37.1 C)] 98.7 F (37.1 C) (07/12 1000) Pulse Rate:  [85-106] 104 (07/12 1000) Resp:  [16-20] 20 (07/12 1000) BP: (124-165)/(74-89) 165/89 mmHg (07/12 1000) SpO2:  [96 %-100 %] 99 % (07/12 1000) Weight:  [68.635 kg (151 lb 5 oz)] 68.635 kg (151 lb 5 oz) (07/12 4010)  Physical Exam:  General: alert, cooperative and no distress Chest clear to ausc Lochia: appropriate Uterine Fundus: firm Incision: healing well, no significant drainage, old blood on honeycomb dressing no fresh blood DVT Evaluation: No evidence of DVT seen on physical exam.   Recent Labs  03/21/14 1933 03/22/14 0508  HGB 10.0* 8.8*  HCT 30.2* 26.4*    Assessment/Plan: Status post Cesarean section.at 34w1 for severe range BP's, IOL due to Flash pulmonary edema while in induced labor for severe Pre-E   Doing well postoperatively. discharge Monday, will need only minimal analgesics.  D/c monday.  Desere Gwin V 03/23/2014, 10:40 AM

## 2014-03-23 NOTE — Progress Notes (Signed)
Mother requested to speak with CSW.  She is requesting assistance with obtaining needed babycare items.  Informed that she only has a car seat.  Informed her of services provided through [employer].   No other concerns communicated at this time.

## 2014-03-24 ENCOUNTER — Other Ambulatory Visit: Payer: Medicaid Other

## 2014-03-24 ENCOUNTER — Encounter (HOSPITAL_COMMUNITY): Payer: Self-pay | Admitting: Obstetrics & Gynecology

## 2014-03-24 LAB — TYPE AND SCREEN
ABO/RH(D): O POS
ANTIBODY SCREEN: NEGATIVE
UNIT DIVISION: 0
Unit division: 0

## 2014-03-24 MED ORDER — IBUPROFEN 600 MG PO TABS: 600.0000 mg | ORAL_TABLET | Freq: Four times a day (QID) | ORAL | Status: DC

## 2014-03-24 MED ORDER — HYDROCHLOROTHIAZIDE 25 MG PO TABS
25.0000 mg | ORAL_TABLET | Freq: Every day | ORAL | Status: DC
Start: 2014-03-24 — End: 2015-03-07

## 2014-03-24 MED ORDER — ACETAMINOPHEN-CODEINE #3 300-30 MG PO TABS: 2.0000 | ORAL_TABLET | ORAL | Status: DC | PRN

## 2014-03-24 MED ORDER — MEDROXYPROGESTERONE ACETATE 150 MG/ML IM SUSP
150.0000 mg | Freq: Once | INTRAMUSCULAR | Status: AC
  Administered 2014-03-24: 150 mg via INTRAMUSCULAR
  Filled 2014-03-24: qty 1

## 2014-03-24 MED ORDER — LABETALOL HCL 200 MG PO TABS: 400.0000 mg | ORAL_TABLET | Freq: Two times a day (BID) | ORAL | Status: DC

## 2014-03-24 NOTE — Discharge Instructions (Signed)
Hypertension Hypertension is another name for high blood pressure. High blood pressure forces your heart to work harder to pump blood. A blood pressure reading has two numbers, which includes a higher number over a lower number (example: 110/72). HOME CARE   Have your blood pressure rechecked by your doctor.  Only take medicine as told by your doctor. Follow the directions carefully. The medicine does not work as well if you skip doses. Skipping doses also puts you at risk for problems.  Do not smoke.  Monitor your blood pressure at home as told by your doctor. GET HELP IF:  You think you are having a reaction to the medicine you are taking.  You have repeat headaches or feel dizzy.  You have puffiness (swelling) in your ankles.  You have trouble with your vision. GET HELP RIGHT AWAY IF:   You get a very bad headache and are confused.  You feel weak, numb, or faint.  You get chest or belly (abdominal) pain.  You throw up (vomit).  You cannot breathe very well. MAKE SURE YOU:   Understand these instructions.  Will watch your condition.  Will get help right away if you are not doing well or get worse. Document Released: 02/15/2008 Document Revised: 09/03/2013 Document Reviewed: 06/21/2013 [facility] Patient Information 2015 [place], [place]. This information is not intended to replace advice given to you by your health care provider. Make sure you discuss any questions you have with your health care provider. Cesarean Delivery Care After Refer to this sheet in the next few weeks. These instructions provide you with information on caring for yourself after your procedure. Your health care provider may also give you specific instructions. Your treatment has been planned according to current medical practices, but problems sometimes occur. Call your health care provider if you have any problems or questions after you go home. HOME CARE INSTRUCTIONS  Only take over-the-counter or  prescription medications as directed by your health care provider.  Do not drink alcohol, especially if you are breastfeeding or taking medication to relieve pain.  Do not chew or smoke tobacco.  Continue to use good perineal care. Good perineal care includes:  Wiping your perineum from front to back.  Keeping your perineum clean.  Check your surgical cut (incision) daily for increased redness, drainage, swelling, or separation of skin.  Clean your incision gently with soap and water every day, and then pat it dry. If your health care provider says it is OK, leave the incision uncovered. Use a bandage (dressing) if the incision is draining fluid or appears irritated. If the adhesive strips across the incision do not fall off within 7 days, carefully peel them off.  Hug a pillow when coughing or sneezing until your incision is healed. This helps to relieve pain.  Do not use tampons or douche until your health care provider says it is okay.  Shower, wash your hair, and take tub baths as directed by your health care provider.  Wear a well-fitting bra that provides breast support.  Limit wearing support panties or control-top hose.  Drink enough fluids to keep your urine clear or pale yellow.  Eat high-fiber foods such as whole grain cereals and breads, brown rice, beans, and fresh fruits and vegetables every day. These foods may help prevent or relieve constipation.  Resume activities such as climbing stairs, driving, lifting, exercising, or traveling as directed by your health care provider.  Talk to your health care provider about resuming sexual activities. This  is dependent upon your risk of infection, your rate of healing, and your comfort and desire to resume sexual activity.  Try to have someone help you with your household activities and your newborn for at least a few days after you leave the hospital.  Rest as much as possible. Try to rest or take a nap when your newborn is  sleeping.  Increase your activities gradually.  Keep all of your scheduled postpartum appointments. It is very important to keep your scheduled follow-up appointments. At these appointments, your health care provider will be checking to make sure that you are healing physically and emotionally. SEEK MEDICAL CARE IF:   You are passing large clots from your vagina. Save any clots to show your health care provider.  You have a foul smelling discharge from your vagina.  You have trouble urinating.  You are urinating frequently.  You have pain when you urinate.  You have a change in your bowel movements.  You have increasing redness, pain, or swelling near your incision.  You have pus draining from your incision.  Your incision is separating.  You have painful, hard, or reddened breasts.  You have a severe headache.  You have blurred vision or see spots.  You feel sad or depressed.  You have thoughts of hurting yourself or your newborn.  You have questions about your care, the care of your newborn, or medications.  You are dizzy or lightheaded.  You have a rash.  You have pain, redness, or swelling at the site of the removed intravenous access (IV) tube.  You have nausea or vomiting.  You stopped breastfeeding and have not had a menstrual period within 12 weeks of stopping.  You are not breastfeeding and have not had a menstrual period within 12 weeks of delivery.  You have a fever. SEEK IMMEDIATE MEDICAL CARE IF:  You have persistent pain.  You have chest pain.  You have shortness of breath.  You faint.  You have leg pain.  You have stomach pain.  Your vaginal bleeding saturates 2 or more sanitary pads in 1 hour. MAKE SURE YOU:   Understand these instructions.  Will watch your condition.  Will get help right away if you are not doing well or get worse. Document Released: 05/21/2002 Document Revised: 05/01/2013 Document Reviewed:  04/25/2012 [employer] Patient Information 2015 [place], [place]. This information is not intended to replace advice given to you by your health care provider. Make sure you discuss any questions you have with your health care provider.

## 2014-03-24 NOTE — Progress Notes (Signed)
Discharge instructions complete.Vital signs assessed and scheduled motrin given before discharge. Pt ambulated out of the hospital and discharged home with family.

## 2014-03-24 NOTE — Progress Notes (Signed)
Ur chart review completed.  

## 2014-03-24 NOTE — Discharge Summary (Signed)
Obstetric Discharge Summary Reason for Admission: elevated blood pressures and headaches in pregnancy Prenatal Procedures: NST, Preeclampsia and ultrasound Intrapartum Procedures: cesarean: low cervical, transverse Postpartum Procedures: Lasix and pp Magnesium Complications-Operative and Postpartum: pulmonary edema Hemoglobin  Date Value Ref Range Status  03/22/2014 8.8* 12.0 - 15.0 g/dL Final     HCT  Date Value Ref Range Status  03/22/2014 26.4* 36.0 - 46.0 % Final    Physical Exam:  General: alert, cooperative and appears stated age 80: appropriate Uterine Fundus: firm and NT Incision: no significant drainage, no significant erythema DVT Evaluation: No evidence of DVT seen on physical exam.  Discharge Diagnoses: Preelampsia with severe features, delivered at 34 wks, acute pulmonary edema- resolved, drug use in pregnancy-cocaine, insufficient prenatal care  Discharge Information: Date: 03/24/2014 Activity: pelvic rest and no heavy lifting x 6 wks Diet: low sodium Medications:    Medication List    STOP taking these medications       acetaminophen 325 MG tablet  Commonly known as:  TYLENOL     aspirin 81 MG tablet      TAKE these medications       acetaminophen-codeine 300-30 MG per tablet  Commonly known as:  TYLENOL #3  Take 2 tablets by mouth every 4 (four) hours as needed for moderate pain.     hydrochlorothiazide 25 MG tablet  Commonly known as:  HYDRODIURIL  Take 1 tablet (25 mg total) by mouth daily.     ibuprofen 600 MG tablet  Commonly known as:  ADVIL,MOTRIN  Take 1 tablet (600 mg total) by mouth every 6 (six) hours.     labetalol 200 MG tablet  Commonly known as:  NORMODYNE  Take 2 tablets (400 mg total) by mouth 2 (two) times daily.     prenatal multivitamin Tabs tablet  Take 1 tablet by mouth daily at 12 noon.        Condition: stable Instructions: see discharge orders and AVS Discharge to: home Follow-up Information   Follow up with  [facility] In 6 weeks. (pp check--they will call you with appointment)    Specialty:  Obstetrics and Gynecology   Contact information:   [place] [address] [phone]      Newborn Data: Live born female  Birth Weight: 3 lb 14.4 oz (1769 g) APGAR: 8, 9  Remains in NICU  Bernece Gall S 03/24/2014, 10:11 AM

## 2014-03-27 ENCOUNTER — Other Ambulatory Visit: Payer: Medicaid Other

## 2014-04-10 ENCOUNTER — Ambulatory Visit (INDEPENDENT_AMBULATORY_CARE_PROVIDER_SITE_OTHER): Payer: Medicaid Other | Admitting: Obstetrics & Gynecology

## 2014-04-10 ENCOUNTER — Encounter: Payer: Self-pay | Admitting: Obstetrics & Gynecology

## 2014-04-10 VITALS — BP 150/92 | HR 78 | Ht <= 58 in | Wt 116.4 lb

## 2014-04-10 DIAGNOSIS — I10 Essential (primary) hypertension: Secondary | ICD-10-CM

## 2014-04-10 MED ORDER — AMLODIPINE BESYLATE 5 MG PO TABS: 5.0000 mg | ORAL_TABLET | Freq: Every day | ORAL | Status: DC

## 2014-04-10 NOTE — Progress Notes (Signed)
Patient ID: Marissa Meyer, female   DOB: 1981/03/04, 33 y.o.   MRN: [id number] Subjective: 20 days postop CS     Marissa Meyer is a 33 y.o. female who presents for a postpartum visit. She is 3 weeks postpartum following a low cervical transverse Cesarean section. I have fully reviewed the prenatal and intrapartum course. The delivery was at 34 gestational weeks. Outcome: primary cesarean section, low transverse incision. Anesthesia: spinal. Postpartum course has been good . Baby's course has been good. Baby is feeding by bottle -  . Bleeding no bleeding. Bowel function is normal. Bladder function is normal. Patient is not sexually active. Contraception method is abstinence. Postpartum depression screening: negative.  The following portions of the patient's history were reviewed and updated as appropriate: allergies, current medications, past family history, past medical history, past social history, past surgical history and problem list.  Review of Systems Pertinent items are noted in HPI.   Objective:    BP 150/92  Pulse 78  Ht 4\' 9"  (1.448 m)  Wt 116 lb 6.4 oz (52.799 kg)  BMI 25.18 kg/m2  Breastfeeding? No  General:  alert and cooperative   Breasts:     Lungs: clear to auscultation bilaterally  Heart:  regular rate and rhythm, S1, S2 normal, no murmur, click, rub or gallop  Abdomen: soft, non-tender; bowel sounds normal; no masses,  no organomegaly and incision ok   Vulva:  not evaluated  Vagina: not evaluated  Cervix:     Corpus: not examined  Adnexa:  not evaluated  Rectal Exam: Not performed.        Assessment:     postpartum exam.Hypertension   Plan:    1. Contraception: abstinence 2. D/C labetalol and add Norvasc 5 mg 3. Follow up in: 2 weeks or as needed.   Woodroe Mode, MD 04/10/2014

## 2014-05-21 ENCOUNTER — Emergency Department (HOSPITAL_COMMUNITY)
Admission: EM | Admit: 2014-05-21 | Discharge: 2014-05-22 | Disposition: A | Discharge status: Home / Self Care | Payer: Medicaid Other | Attending: Emergency Medicine | Admitting: Emergency Medicine

## 2014-05-21 ENCOUNTER — Encounter (HOSPITAL_COMMUNITY): Payer: Self-pay | Admitting: Emergency Medicine

## 2014-05-21 DIAGNOSIS — Z880 Allergy status to penicillin: Secondary | ICD-10-CM | POA: Diagnosis not present

## 2014-05-21 DIAGNOSIS — Z79899 Other long term (current) drug therapy: Secondary | ICD-10-CM | POA: Insufficient documentation

## 2014-05-21 DIAGNOSIS — R519 Headache, unspecified: Secondary | ICD-10-CM

## 2014-05-21 DIAGNOSIS — Z8742 Personal history of other diseases of the female genital tract: Secondary | ICD-10-CM | POA: Diagnosis not present

## 2014-05-21 DIAGNOSIS — Z8619 Personal history of other infectious and parasitic diseases: Secondary | ICD-10-CM | POA: Diagnosis not present

## 2014-05-21 DIAGNOSIS — Z862 Personal history of diseases of the blood and blood-forming organs and certain disorders involving the immune mechanism: Secondary | ICD-10-CM | POA: Insufficient documentation

## 2014-05-21 DIAGNOSIS — I10 Essential (primary) hypertension: Secondary | ICD-10-CM | POA: Diagnosis not present

## 2014-05-21 DIAGNOSIS — H53149 Visual discomfort, unspecified: Secondary | ICD-10-CM | POA: Diagnosis not present

## 2014-05-21 DIAGNOSIS — Z8659 Personal history of other mental and behavioral disorders: Secondary | ICD-10-CM | POA: Insufficient documentation

## 2014-05-21 DIAGNOSIS — R112 Nausea with vomiting, unspecified: Secondary | ICD-10-CM | POA: Diagnosis not present

## 2014-05-21 DIAGNOSIS — R51 Headache: Secondary | ICD-10-CM | POA: Insufficient documentation

## 2014-05-21 MED ORDER — METOCLOPRAMIDE HCL 5 MG/ML IJ SOLN
10.0000 mg | INTRAMUSCULAR | Status: AC
  Administered 2014-05-21: 10 mg via INTRAVENOUS
  Filled 2014-05-21: qty 2

## 2014-05-21 MED ORDER — FENTANYL CITRATE 0.05 MG/ML IJ SOLN
INTRAMUSCULAR | Status: AC
  Filled 2014-05-21: qty 2

## 2014-05-21 MED ORDER — KETOROLAC TROMETHAMINE 30 MG/ML IJ SOLN
30.0000 mg | Freq: Once | INTRAMUSCULAR | Status: AC
  Administered 2014-05-21: 30 mg via INTRAVENOUS
  Filled 2014-05-21: qty 1

## 2014-05-21 MED ORDER — FENTANYL CITRATE 0.05 MG/ML IJ SOLN
50.0000 ug | Freq: Once | INTRAMUSCULAR | Status: AC
  Administered 2014-05-21: 50 ug via NASAL

## 2014-05-21 MED ORDER — SODIUM CHLORIDE 0.9 % IV BOLUS (SEPSIS)
1000.0000 mL | Freq: Once | INTRAVENOUS | Status: AC
  Administered 2014-05-21: 1000 mL via INTRAVENOUS

## 2014-05-21 MED ORDER — DIPHENHYDRAMINE HCL 50 MG/ML IJ SOLN
25.0000 mg | Freq: Once | INTRAMUSCULAR | Status: AC
  Administered 2014-05-21: 25 mg via INTRAVENOUS
  Filled 2014-05-21: qty 1

## 2014-05-21 NOTE — ED Notes (Signed)
Pt. reports migraine headache with emesis and photophobia onset this morning unrelieved by OTC pain medication .

## 2014-05-21 NOTE — ED Provider Notes (Signed)
CSN: [id number]     Arrival date & time 05/21/14  2012 History   First MD Initiated Contact with Patient 05/21/14 2244     Chief Complaint  Patient presents with  . Migraine    (Consider location/radiation/quality/duration/timing/severity/associated sxs/prior Treatment) HPI Comments: Patient is a 33 year old female with a history of hypertension, migraine headaches, and anemia who presents to the emergency department for a headache which began this morning. Patient states that pain is in her frontal region as well as behind her eyes. Pain is throbbing and not relieved by Tylenol 3 or Mucinex. Symptoms associated with nausea as well as 2 episodes of nonbloody, nonbilious emesis. Patient also endorses photophobia and phonophobia. Symptoms today consistent with past migraine headaches. Patient denies head injury or trauma, fever, neck stiffness, vision loss, nasal congestion or rhinorrhea, extremity numbness/weakness, and gait difficulty.  Patient is a 33 y.o. female presenting with migraines. The history is provided by the patient. No language interpreter was used.  Migraine Associated symptoms include headaches, nausea and vomiting. Pertinent negatives include no fever.    Past Medical History  Diagnosis Date  . Hypertension   . Chlamydia   . Gonorrhea   . Trichomonas   . Migraines     but not diagnosed  . Ectopic pregnancy   . Preterm delivery   . Fibroid   . Pregnancy induced hypertension   . Mental disorder   . Anemia    Past Surgical History  Procedure Laterality Date  . Dilation and curettage of uterus    . Ectopic pregnancy surgery    . Cesarean section N/A 03/21/2014    Procedure: CESAREAN SECTION;  Surgeon: Osborne Oman, MD;  Location: [facility];  Service: Obstetrics;  Laterality: N/A;   Family History  Problem Relation Age of Onset  . Hypertension Mother   . Hypertension Maternal Grandmother   . Cancer Maternal Grandfather    History  Substance Use Topics  .  Smoking status: Former Smoker -- 0.25 packs/day    Types: Cigarettes    Quit date: 08/13/2013  . Smokeless tobacco: Never Used  . Alcohol Use: Yes     Comment: occ   OB History   Grav Para Term Preterm Abortions TAB SAB Ect Mult Living   4 3 0 3 1   1  3       Review of Systems  Constitutional: Negative for fever.  Eyes: Positive for photophobia. Negative for visual disturbance.  Gastrointestinal: Positive for nausea and vomiting.  Neurological: Positive for headaches. Negative for syncope.  All other systems reviewed and are negative.   Allergies  Amoxicillin  Home Medications   Prior to Admission medications   Medication Sig Start Date End Date Taking? Authorizing Provider  acetaminophen-codeine (TYLENOL #3) 300-30 MG per tablet Take 2 tablets by mouth every 4 (four) hours as needed for moderate pain. 03/24/14  Yes Donnamae Jude, MD  amLODipine (NORVASC) 5 MG tablet Take 1 tablet (5 mg total) by mouth daily. 04/10/14  Yes Woodroe Mode, MD  hydrochlorothiazide (HYDRODIURIL) 25 MG tablet Take 1 tablet (25 mg total) by mouth daily. 03/24/14  Yes Donnamae Jude, MD  ibuprofen (ADVIL,MOTRIN) 600 MG tablet Take 1 tablet (600 mg total) by mouth every 6 (six) hours. 03/24/14  Yes Donnamae Jude, MD  medroxyPROGESTERone (DEPO-SUBQ PROVERA 104) 104 MG/0.65ML injection Inject 104 mg into the skin every 3 (three) months.   Yes Historical Provider, MD  naproxen (NAPROSYN) 500 MG tablet Take 1 tablet (500  mg total) by mouth 2 (two) times daily. 05/22/14   Antonietta Breach, PA-C   BP 156/84  Pulse 83  Temp(Src) 99.2 F (37.3 C) (Oral)  Resp 16  Ht 4\' 9"  (1.448 m)  Wt 120 lb (54.432 kg)  BMI 25.96 kg/m2  SpO2 100%  Physical Exam  Nursing note and vitals reviewed. Constitutional: She is oriented to person, place, and time. She appears well-developed and well-nourished. No distress.  Nontoxic/nonseptic appearing  HENT:  Head: Normocephalic and atraumatic.  Mouth/Throat: Oropharynx is clear  and moist. No oropharyngeal exudate.  Eyes: Conjunctivae and EOM are normal. Pupils are equal, round, and reactive to light. No scleral icterus.  Pupils equal round and reactive to direct and consensual light  Neck: Normal range of motion.  No nuchal rigidity or meningismus  Cardiovascular: Normal rate, regular rhythm and intact distal pulses.   Pulmonary/Chest: Effort normal. No respiratory distress.  Chest expansion symmetric.  Musculoskeletal: Normal range of motion.  Neurological: She is alert and oriented to person, place, and time. No cranial nerve deficit. She exhibits normal muscle tone. Coordination normal.  GCS 15. Speech is goal oriented. No cranial nerve deficits appreciated; symmetric eyebrow raise, no facial drooping, tongue midline. Patient has equal grip strength and 5/5 strength against resistance in all major muscle groups bilaterally. No gross sensory deficits appreciated. Patient moves extremities without ataxia; finger to nose intact.  Skin: Skin is warm and dry. No rash noted. She is not diaphoretic. No erythema. No pallor.  Psychiatric: She has a normal mood and affect. Her behavior is normal.    ED Course  Procedures (including critical care time) Labs Review Labs Reviewed - No data to display  Imaging Review No results found.   EKG Interpretation None      MDM   Final diagnoses:  Headache, unspecified headache type    33 year old female with a history of migraine headaches presents to the emergency department for a headache consistent with prior migraines. Symptoms began this morning and had been gradually worsening over the course of the day. Patient denies fever and neck stiffness. No fever or nuchal rigidity or meningismus while in ED. no history of head trauma or injury. Neurologic exam nonfocal. Doubt emergent intracranial process.  Symptoms treated with Toradol, Reglan, and Benadryl. Patient, on recheck, endorses complete resolution in symptoms with  this treatment regimen. Symptoms c/w typical migraine. No indication for further emergent work up. Patient stable and appropriate for discharge with prescription for naproxen. Have advised primary care follow up and discussed return precautions. Patient agreeable to plan with no unaddressed concerns.   Filed Vitals:   05/21/14 2031 05/21/14 2302 05/21/14 2315 05/21/14 2339  BP: 154/113 174/94 156/84   Pulse: 84 75 83   Temp: 98 F (36.7 C)   99.2 F (37.3 C)  TempSrc: Oral   Oral  Resp: 22 18  16   Height: 4\' 9"  (1.448 m)     Weight: 120 lb (54.432 kg)     SpO2: 100% 100% 100%      Antonietta Breach, PA-C 05/22/14 0029

## 2014-05-22 MED ORDER — NAPROXEN 500 MG PO TABS: 500.0000 mg | ORAL_TABLET | Freq: Two times a day (BID) | ORAL | Status: DC

## 2014-05-22 NOTE — ED Provider Notes (Signed)
Medical screening examination/treatment/procedure(s) were performed by non-physician practitioner and as supervising physician I was immediately available for consultation/collaboration.   Delora Fuel, MD 29/47/65 [id number]

## 2014-05-22 NOTE — Discharge Instructions (Signed)
Try to rest in a quiet, dark room for at least 8 hours. Take naproxen for pain control; recommend this for at least the next 24 hours. Follow up with a primary care provider.  Headaches, Frequently Asked Questions MIGRAINE HEADACHES Q: What is migraine? What causes it? How can I treat it? A: Generally, migraine headaches begin as a dull ache. Then they develop into a constant, throbbing, and pulsating pain. You may experience pain at the temples. You may experience pain at the front or back of one or both sides of the head. The pain is usually accompanied by a combination of:  Nausea.  Vomiting.  Sensitivity to light and noise. Some people (about 15%) experience an aura (see below) before an attack. The cause of migraine is believed to be chemical reactions in the brain. Treatment for migraine may include over-the-counter or prescription medications. It may also include self-help techniques. These include relaxation training and biofeedback.  Q: What is an aura? A: About 15% of people with migraine get an "aura". This is a sign of neurological symptoms that occur before a migraine headache. You may see wavy or jagged lines, dots, or flashing lights. You might experience tunnel vision or blind spots in one or both eyes. The aura can include visual or auditory hallucinations (something imagined). It may include disruptions in smell (such as strange odors), taste or touch. Other symptoms include:  Numbness.  A "pins and needles" sensation.  Difficulty in recalling or speaking the correct word. These neurological events may last as long as 60 minutes. These symptoms will fade as the headache begins. Q: What is a trigger? A: Certain physical or environmental factors can lead to or "trigger" a migraine. These include:  Foods.  Hormonal changes.  Weather.  Stress. It is important to remember that triggers are different for everyone. To help prevent migraine attacks, you need to figure out  which triggers affect you. Keep a headache diary. This is a good way to track triggers. The diary will help you talk to your healthcare professional about your condition. Q: Does weather affect migraines? A: Bright sunshine, hot, humid conditions, and drastic changes in barometric pressure may lead to, or "trigger," a migraine attack in some people. But studies have shown that weather does not act as a trigger for everyone with migraines. Q: What is the link between migraine and hormones? A: Hormones start and regulate many of your body's functions. Hormones keep your body in balance within a constantly changing environment. The levels of hormones in your body are unbalanced at times. Examples are during menstruation, pregnancy, or menopause. That can lead to a migraine attack. In fact, about three quarters of all women with migraine report that their attacks are related to the menstrual cycle.  Q: Is there an increased risk of stroke for migraine sufferers? A: The likelihood of a migraine attack causing a stroke is very remote. That is not to say that migraine sufferers cannot have a stroke associated with their migraines. In persons under age 51, the most common associated factor for stroke is migraine headache. But over the course of a person's normal life span, the occurrence of migraine headache may actually be associated with a reduced risk of dying from cerebrovascular disease due to stroke.  Q: What are acute medications for migraine? A: Acute medications are used to treat the pain of the headache after it has started. Examples over-the-counter medications, NSAIDs, ergots, and triptans.  Q: What are the triptans? A: Triptans  are the newest class of abortive medications. They are specifically targeted to treat migraine. Triptans are vasoconstrictors. They moderate some chemical reactions in the brain. The triptans work on receptors in your brain. Triptans help to restore the balance of a  neurotransmitter called serotonin. Fluctuations in levels of serotonin are thought to be a main cause of migraine.  Q: Are over-the-counter medications for migraine effective? A: Over-the-counter, or "OTC," medications may be effective in relieving mild to moderate pain and associated symptoms of migraine. But you should see your caregiver before beginning any treatment regimen for migraine.  Q: What are preventive medications for migraine? A: Preventive medications for migraine are sometimes referred to as "prophylactic" treatments. They are used to reduce the frequency, severity, and length of migraine attacks. Examples of preventive medications include antiepileptic medications, antidepressants, beta-blockers, calcium channel blockers, and NSAIDs (nonsteroidal anti-inflammatory drugs). Q: Why are anticonvulsants used to treat migraine? A: During the past few years, there has been an increased interest in antiepileptic drugs for the prevention of migraine. They are sometimes referred to as "anticonvulsants". Both epilepsy and migraine may be caused by similar reactions in the brain.  Q: Why are antidepressants used to treat migraine? A: Antidepressants are typically used to treat people with depression. They may reduce migraine frequency by regulating chemical levels, such as serotonin, in the brain.  Q: What alternative therapies are used to treat migraine? A: The term "alternative therapies" is often used to describe treatments considered outside the scope of conventional Western medicine. Examples of alternative therapy include acupuncture, acupressure, and yoga. Another common alternative treatment is herbal therapy. Some herbs are believed to relieve headache pain. Always discuss alternative therapies with your caregiver before proceeding. Some herbal products contain arsenic and other toxins. TENSION HEADACHES Q: What is a tension-type headache? What causes it? How can I treat it? A:  Tension-type headaches occur randomly. They are often the result of temporary stress, anxiety, fatigue, or anger. Symptoms include soreness in your temples, a tightening band-like sensation around your head (a "vice-like" ache). Symptoms can also include a pulling feeling, pressure sensations, and contracting head and neck muscles. The headache begins in your forehead, temples, or the back of your head and neck. Treatment for tension-type headache may include over-the-counter or prescription medications. Treatment may also include self-help techniques such as relaxation training and biofeedback. CLUSTER HEADACHES Q: What is a cluster headache? What causes it? How can I treat it? A: Cluster headache gets its name because the attacks come in groups. The pain arrives with little, if any, warning. It is usually on one side of the head. A tearing or bloodshot eye and a runny nose on the same side of the headache may also accompany the pain. Cluster headaches are believed to be caused by chemical reactions in the brain. They have been described as the most severe and intense of any headache type. Treatment for cluster headache includes prescription medication and oxygen. SINUS HEADACHES Q: What is a sinus headache? What causes it? How can I treat it? A: When a cavity in the bones of the face and skull (a sinus) becomes inflamed, the inflammation will cause localized pain. This condition is usually the result of an allergic reaction, a tumor, or an infection. If your headache is caused by a sinus blockage, such as an infection, you will probably have a fever. An x-ray will confirm a sinus blockage. Your caregiver's treatment might include antibiotics for the infection, as well as antihistamines or decongestants.  REBOUND HEADACHES Q: What is a rebound headache? What causes it? How can I treat it? A: A pattern of taking acute headache medications too often can lead to a condition known as "rebound headache." A  pattern of taking too much headache medication includes taking it more than 2 days per week or in excessive amounts. That means more than the label or a caregiver advises. With rebound headaches, your medications not only stop relieving pain, they actually begin to cause headaches. Doctors treat rebound headache by tapering the medication that is being overused. Sometimes your caregiver will gradually substitute a different type of treatment or medication. Stopping may be a challenge. Regularly overusing a medication increases the potential for serious side effects. Consult a caregiver if you regularly use headache medications more than 2 days per week or more than the label advises. ADDITIONAL QUESTIONS AND ANSWERS Q: What is biofeedback? A: Biofeedback is a self-help treatment. Biofeedback uses special equipment to monitor your body's involuntary physical responses. Biofeedback monitors:  Breathing.  Pulse.  Heart rate.  Temperature.  Muscle tension.  Brain activity. Biofeedback helps you refine and perfect your relaxation exercises. You learn to control the physical responses that are related to stress. Once the technique has been mastered, you do not need the equipment any more. Q: Are headaches hereditary? A: Four out of five (80%) of people that suffer report a family history of migraine. Scientists are not sure if this is genetic or a family predisposition. Despite the uncertainty, a child has a 50% chance of having migraine if one parent suffers. The child has a 75% chance if both parents suffer.  Q: Can children get headaches? A: By the time they reach high school, most young people have experienced some type of headache. Many safe and effective approaches or medications can prevent a headache from occurring or stop it after it has begun.  Q: What type of doctor should I see to diagnose and treat my headache? A: Start with your primary caregiver. Discuss his or her experience and  approach to headaches. Discuss methods of classification, diagnosis, and treatment. Your caregiver may decide to recommend you to a headache specialist, depending upon your symptoms or other physical conditions. Having diabetes, allergies, etc., may require a more comprehensive and inclusive approach to your headache. The National Head[employer] Foundation will provide, upon request, a list of [facility] physician members in your state. Document Released: 11/19/2003 Document Revised: 11/21/2011 Document Reviewed: 04/28/2008 [facility] Patient Information 2015 [place], [place]. This information is not intended to replace advice given to you by your health care provider. Make sure you discuss any questions you have with your health care provider.

## 2014-05-27 ENCOUNTER — Telehealth: Payer: Self-pay | Admitting: General Practice

## 2014-05-27 NOTE — Telephone Encounter (Signed)
Patient called and left message stating she was seen here for her pp visit and she wants to make sure nothing else needs to be done. Called patient back and told her if she has had her pp visit nothing else needs to be done. Patient states she got a depo shot when she was d/c on 7/13 and asked when her next shot would be. Told patient it would be 10/4-10/19. Patient verbalized understanding and had no other questions

## 2014-07-14 ENCOUNTER — Encounter (HOSPITAL_COMMUNITY): Payer: Self-pay | Admitting: Emergency Medicine

## 2015-01-29 ENCOUNTER — Emergency Department (HOSPITAL_COMMUNITY)
Admission: EM | Admit: 2015-01-29 | Discharge: 2015-01-29 | Disposition: A | Discharge status: Home / Self Care | Payer: Medicaid Other | Attending: Emergency Medicine | Admitting: Emergency Medicine

## 2015-01-29 ENCOUNTER — Encounter (HOSPITAL_COMMUNITY): Payer: Self-pay | Admitting: Emergency Medicine

## 2015-01-29 DIAGNOSIS — Z3202 Encounter for pregnancy test, result negative: Secondary | ICD-10-CM | POA: Insufficient documentation

## 2015-01-29 DIAGNOSIS — Z791 Long term (current) use of non-steroidal anti-inflammatories (NSAID): Secondary | ICD-10-CM | POA: Insufficient documentation

## 2015-01-29 DIAGNOSIS — Z862 Personal history of diseases of the blood and blood-forming organs and certain disorders involving the immune mechanism: Secondary | ICD-10-CM | POA: Diagnosis not present

## 2015-01-29 DIAGNOSIS — Z87891 Personal history of nicotine dependence: Secondary | ICD-10-CM | POA: Diagnosis not present

## 2015-01-29 DIAGNOSIS — Z8742 Personal history of other diseases of the female genital tract: Secondary | ICD-10-CM | POA: Diagnosis not present

## 2015-01-29 DIAGNOSIS — R300 Dysuria: Secondary | ICD-10-CM | POA: Diagnosis present

## 2015-01-29 DIAGNOSIS — I10 Essential (primary) hypertension: Secondary | ICD-10-CM | POA: Insufficient documentation

## 2015-01-29 DIAGNOSIS — Z880 Allergy status to penicillin: Secondary | ICD-10-CM | POA: Diagnosis not present

## 2015-01-29 DIAGNOSIS — Z79899 Other long term (current) drug therapy: Secondary | ICD-10-CM | POA: Insufficient documentation

## 2015-01-29 DIAGNOSIS — A5901 Trichomonal vulvovaginitis: Secondary | ICD-10-CM

## 2015-01-29 LAB — URINALYSIS, ROUTINE W REFLEX MICROSCOPIC
BILIRUBIN URINE: NEGATIVE
Glucose, UA: NEGATIVE mg/dL
Ketones, ur: NEGATIVE mg/dL
Nitrite: NEGATIVE
PROTEIN: 30 mg/dL — AB
Specific Gravity, Urine: 1.02 (ref 1.005–1.030)
Urobilinogen, UA: 0.2 mg/dL (ref 0.0–1.0)
pH: 5.5 (ref 5.0–8.0)

## 2015-01-29 LAB — URINE MICROSCOPIC-ADD ON

## 2015-01-29 LAB — WET PREP, GENITAL: Yeast Wet Prep HPF POC: NONE SEEN

## 2015-01-29 LAB — POC URINE PREG, ED: PREG TEST UR: NEGATIVE

## 2015-01-29 MED ORDER — METRONIDAZOLE 500 MG PO TABS: 500.0000 mg | ORAL_TABLET | Freq: Two times a day (BID) | ORAL | Status: DC

## 2015-01-29 MED ORDER — AZITHROMYCIN 1 G PO PACK: 1.0000 g | PACK | Freq: Once | ORAL | Status: DC

## 2015-01-29 NOTE — ED Notes (Addendum)
Pelvic cart at bedside. 

## 2015-01-29 NOTE — ED Provider Notes (Signed)
CSN: [id number]     Arrival date & time 01/29/15  1252 History  This chart was scribed for [facility], NP, working with Ernestina Patches, MD by Starleen Arms, ED Scribe. This patient was seen in room TR02C/TR02C and the patient's care was started at 1:55 PM.   Chief Complaint  Patient presents with  . Dysuria   The history is provided by the patient. No language interpreter was used.   HPI Comments: Marissa Meyer is a 34 y.o. female who presents to the Emergency Department complaining of burning and vaginal d/c.  She reports she began having unprotected intercourse with a new partner 2-3 weeks ago and that her symptoms onset after these encounters.  She has a previous history of UTI and ghonorrhea.  Patient has a history of HTN and no other chronic medical conditions.  She denies abdominal pain, fever, chills, n/v/d.  [id number].  Past Medical History  Diagnosis Date  . Hypertension   . Chlamydia   . Gonorrhea   . Trichomonas   . Migraines     but not diagnosed  . Ectopic pregnancy   . Preterm delivery   . Fibroid   . Pregnancy induced hypertension   . Mental disorder   . Anemia    Past Surgical History  Procedure Laterality Date  . Dilation and curettage of uterus    . Ectopic pregnancy surgery    . Cesarean section N/A 03/21/2014    Procedure: CESAREAN SECTION;  Surgeon: Osborne Oman, MD;  Location: [facility];  Service: Obstetrics;  Laterality: N/A;   Family History  Problem Relation Age of Onset  . Hypertension Mother   . Hypertension Maternal Grandmother   . Cancer Maternal Grandfather    History  Substance Use Topics  . Smoking status: Former Smoker -- 0.25 packs/day    Types: Cigarettes    Quit date: 08/13/2013  . Smokeless tobacco: Never Used  . Alcohol Use: Yes     Comment: occ   OB History    Gravida Para Term Preterm AB TAB SAB Ectopic Multiple Living   4 3 0 3 1   1  3      Review of Systems  Genitourinary: Positive for dysuria, vaginal discharge and  vaginal pain. Negative for urgency, frequency, vaginal bleeding and pelvic pain.  All other systems reviewed and are negative.     Allergies  Amoxicillin  Home Medications   Prior to Admission medications   Medication Sig Start Date End Date Taking? Authorizing Provider  acetaminophen-codeine (TYLENOL #3) 300-30 MG per tablet Take 2 tablets by mouth every 4 (four) hours as needed for moderate pain. 03/24/14   Donnamae Jude, MD  amLODipine (NORVASC) 5 MG tablet Take 1 tablet (5 mg total) by mouth daily. 04/10/14   Woodroe Mode, MD  azithromycin [facility]) 1 G powder Take 1 packet (1 g total) by mouth once. 01/29/15   Kimberl Vig Bunnie Pion, NP  hydrochlorothiazide (HYDRODIURIL) 25 MG tablet Take 1 tablet (25 mg total) by mouth daily. 03/24/14   Donnamae Jude, MD  ibuprofen (ADVIL,MOTRIN) 600 MG tablet Take 1 tablet (600 mg total) by mouth every 6 (six) hours. 03/24/14   Donnamae Jude, MD  medroxyPROGESTERone (DEPO-SUBQ PROVERA 104) 104 MG/0.65ML injection Inject 104 mg into the skin every 3 (three) months.    Historical Provider, MD  metroNIDAZOLE (FLAGYL) 500 MG tablet Take 1 tablet (500 mg total) by mouth 2 (two) times daily. 01/29/15   Slinger,  NP  naproxen (NAPROSYN) 500 MG tablet Take 1 tablet (500 mg total) by mouth 2 (two) times daily. 05/22/14   Antonietta Breach, PA-C   BP 138/89 mmHg  Pulse 97  Temp(Src) 99 F (37.2 C)  Resp 16  SpO2 100% Physical Exam  Constitutional: She is oriented to person, place, and time. She appears well-developed and well-nourished. No distress.  HENT:  Head: Normocephalic and atraumatic.  Eyes: Conjunctivae and EOM are normal.  Neck: Neck supple. No tracheal deviation present.  Cardiovascular: Normal rate.   Pulmonary/Chest: Effort normal. No respiratory distress.  Abdominal: Soft. There is no tenderness.  Genitourinary:  External genitalia without lesions, frothy d/c vaginal vault, no CMT, no adnexal tenderness. Uterus without palpable enlargement.    Musculoskeletal: Normal range of motion.  Neurological: She is alert and oriented to person, place, and time.  Skin: Skin is warm and dry.  Psychiatric: She has a normal mood and affect. Her behavior is normal.  Nursing note and vitals reviewed.   ED Course  Procedures (including critical care time)  DIAGNOSTIC STUDIES: Oxygen Saturation is 97% on RA, normal by my interpretation.    COORDINATION OF CARE:  2:00 PM Discussed treatment plan with patient at bedside.  Patient acknowledges and agrees with plan.    Labs Review Results for orders placed or performed during the hospital encounter of 01/29/15 (from the past 24 hour(s))  Urinalysis, Routine w reflex microscopic     Status: Abnormal   Collection Time: 01/29/15  1:05 PM  Result Value Ref Range   Color, Urine YELLOW YELLOW   APPearance CLOUDY (A) CLEAR   Specific Gravity, Urine 1.020 1.005 - 1.030   pH 5.5 5.0 - 8.0   Glucose, UA NEGATIVE NEGATIVE mg/dL   Hgb urine dipstick MODERATE (A) NEGATIVE   Bilirubin Urine NEGATIVE NEGATIVE   Ketones, ur NEGATIVE NEGATIVE mg/dL   Protein, ur 30 (A) NEGATIVE mg/dL   Urobilinogen, UA 0.2 0.0 - 1.0 mg/dL   Nitrite NEGATIVE NEGATIVE   Leukocytes, UA LARGE (A) NEGATIVE  Urine microscopic-add on     Status: Abnormal   Collection Time: 01/29/15  1:05 PM  Result Value Ref Range   Squamous Epithelial / LPF MANY (A) RARE   WBC, UA TOO NUMEROUS TO COUNT <3 WBC/hpf   RBC / HPF 3-6 <3 RBC/hpf   Bacteria, UA MANY (A) RARE   Urine-Other TRICHOMONAS PRESENT   POC Urine Pregnancy, ED (do NOT order at [facility])     Status: None   Collection Time: 01/29/15  1:52 PM  Result Value Ref Range   Preg Test, Ur NEGATIVE NEGATIVE  Wet prep, genital     Status: Abnormal   Collection Time: 01/29/15  1:59 PM  Result Value Ref Range   Yeast Wet Prep HPF POC NONE SEEN NONE SEEN   Trich, Wet Prep MANY (A) NONE SEEN   Clue Cells Wet Prep HPF POC FEW (A) NONE SEEN   WBC, Wet Prep HPF POC FEW (A) NONE SEEN      MDM  34 y.o. female with vaginal d/c and itching. Will treat for trichomonas and cervicitis. Discussed need for partner treatment and follow up at the health department for further screening for both. Stable for d/c without symptoms of PID. Cultures for GC and Chlamydia pending. HIV and RPR pending. Urine sent for culture  Final diagnoses:  Trichomonas vaginitis   I personally performed the services described in this documentation, which was scribed in my presence. The recorded information has  been reviewed and is accurate.    [place], [place] 01/29/15 2033  Ernestina Patches, MD 01/29/15 2055

## 2015-01-29 NOTE — Discharge Instructions (Signed)
Your wet prep today shows that you have a trichomonas infection. We are treating you with antibiotics. Your cultures will not be back for 2 days. We will call you if they are positive. Men do not usually have symptoms so be sure he goes to the health department for treatment. No sex for one week after you are both treated.   Trichomoniasis Trichomoniasis is an infection caused by an organism called Trichomonas. The infection can affect both women and men. In women, the outer female genitalia and the vagina are affected. In men, the penis is mainly affected, but the prostate and other reproductive organs can also be involved. Trichomoniasis is a sexually transmitted infection (STI) and is most often passed to another person through sexual contact.  RISK FACTORS  Having unprotected sexual intercourse.  Having sexual intercourse with an infected partner. SIGNS AND SYMPTOMS  Symptoms of trichomoniasis in women include:  Abnormal gray-green frothy vaginal discharge.  Itching and irritation of the vagina.  Itching and irritation of the area outside the vagina. Symptoms of trichomoniasis in men include:   Penile discharge with or without pain.  Pain during urination. This results from inflammation of the urethra. DIAGNOSIS  Trichomoniasis may be found during a Pap test or physical exam. Your health care provider may use one of the following methods to help diagnose this infection:  Examining vaginal discharge under a microscope. For men, urethral discharge would be examined.  Testing the pH of the vagina with a test tape.  Using a vaginal swab test that checks for the Trichomonas organism. A test is available that provides results within a few minutes.  Doing a culture test for the organism. This is not usually needed. TREATMENT   You may be given medicine to fight the infection. Women should inform their health care provider if they could be or are pregnant. Some medicines used to treat  the infection should not be taken during pregnancy.  Your health care provider may recommend over-the-counter medicines or creams to decrease itching or irritation.  Your sexual partner will need to be treated if infected. HOME CARE INSTRUCTIONS   Take medicines only as directed by your health care provider.  Take over-the-counter medicine for itching or irritation as directed by your health care provider.  Do not have sexual intercourse while you have the infection.  Women should not douche or wear tampons while they have the infection.  Discuss your infection with your partner. Your partner may have gotten the infection from you, or you may have gotten it from your partner.  Have your sex partner get examined and treated if necessary.  Practice safe, informed, and protected sex.  See your health care provider for other STI testing. SEEK MEDICAL CARE IF:   You still have symptoms after you finish your medicine.  You develop abdominal pain.  You have pain when you urinate.  You have bleeding after sexual intercourse.  You develop a rash.  Your medicine makes you sick or makes you throw up (vomit). MAKE SURE YOU:  Understand these instructions.  Will watch your condition.  Will get help right away if you are not doing well or get worse. Document Released: 02/22/2001 Document Revised: 01/13/2014 Document Reviewed: 06/10/2013 [facility] Patient Information 2015 [place], [place]. This information is not intended to replace advice given to you by your health care provider. Make sure you discuss any questions you have with your health care provider.  Sexually Transmitted Disease A sexually transmitted disease (STD) is a  disease or infection that may be passed (transmitted) from person to person, usually during sexual activity. This may happen by way of saliva, semen, blood, vaginal mucus, or urine. Common STDs include:   Gonorrhea.   Chlamydia.   Syphilis.   HIV and  AIDS.   Genital herpes.   Hepatitis B and C.   Trichomonas.   Human papillomavirus (HPV).   Pubic lice.   Scabies.  Mites.  Bacterial vaginosis. WHAT ARE CAUSES OF STDs? An STD may be caused by bacteria, a virus, or parasites. STDs are often transmitted during sexual activity if one person is infected. However, they may also be transmitted through nonsexual means. STDs may be transmitted after:   Sexual intercourse with an infected person.   Sharing sex toys with an infected person.   Sharing needles with an infected person or using unclean piercing or tattoo needles.  Having intimate contact with the genitals, mouth, or rectal areas of an infected person.   Exposure to infected fluids during birth. WHAT ARE THE SIGNS AND SYMPTOMS OF STDs? Different STDs have different symptoms. Some people may not have any symptoms. If symptoms are present, they may include:   Painful or bloody urination.   Pain in the pelvis, abdomen, vagina, anus, throat, or eyes.   A skin rash, itching, or irritation.  Growths, ulcerations, blisters, or sores in the genital and anal areas.  Abnormal vaginal discharge with or without bad odor.   Penile discharge in men.   Fever.   Pain or bleeding during sexual intercourse.   Swollen glands in the groin area.   Yellow skin and eyes (jaundice). This is seen with hepatitis.   Swollen testicles.  Infertility.  Sores and blisters in the mouth. HOW ARE STDs DIAGNOSED? To make a diagnosis, your health care provider may:   Take a medical history.   Perform a physical exam.   Take a sample of any discharge to examine.  Swab the throat, cervix, opening to the penis, rectum, or vagina for testing.  Test a sample of your first morning urine.   Perform blood tests.   Perform a Pap test, if this applies.   Perform a colposcopy.   Perform a laparoscopy.  HOW ARE STDs TREATED? Treatment depends on the STD.  Some STDs may be treated but not cured.   Chlamydia, gonorrhea, trichomonas, and syphilis can be cured with antibiotic medicine.   Genital herpes, hepatitis, and HIV can be treated, but not cured, with prescribed medicines. The medicines lessen symptoms.   Genital warts from HPV can be treated with medicine or by freezing, burning (electrocautery), or surgery. Warts may come back.   HPV cannot be cured with medicine or surgery. However, abnormal areas may be removed from the cervix, vagina, or vulva.   If your diagnosis is confirmed, your recent sexual partners need treatment. This is true even if they are symptom-free or have a negative culture or evaluation. They should not have sex until their health care providers say it is okay. HOW CAN I REDUCE MY RISK OF GETTING AN STD? Take these steps to reduce your risk of getting an STD:  Use latex condoms, dental dams, and water-soluble lubricants during sexual activity. Do not use petroleum jelly or oils.  Avoid having multiple sex partners.  Do not have sex with someone who has other sex partners.  Do not have sex with anyone you do not know or who is at high risk for an STD.  Avoid risky sex  practices that can break your skin.  Do not have sex if you have open sores on your mouth or skin.  Avoid drinking too much alcohol or taking illegal drugs. Alcohol and drugs can affect your judgment and put you in a vulnerable position.  Avoid engaging in oral and anal sex acts.  Get vaccinated for HPV and hepatitis. If you have not received these vaccines in the past, talk to your health care provider about whether one or both might be right for you.   If you are at risk of being infected with HIV, it is recommended that you take a prescription medicine daily to prevent HIV infection. This is called pre-exposure prophylaxis (PrEP). You are considered at risk if:  You are a man who has sex with other men (MSM).  You are a heterosexual man  or woman and are sexually active with more than one partner.  You take drugs by injection.  You are sexually active with a partner who has HIV.  Talk with your health care provider about whether you are at high risk of being infected with HIV. If you choose to begin PrEP, you should first be tested for HIV. You should then be tested every 3 months for as long as you are taking PrEP.  WHAT SHOULD I DO IF I THINK I HAVE AN STD?  See your health care provider.   Tell your sexual partner(s). They should be tested and treated for any STDs.  Do not have sex until your health care provider says it is okay. WHEN SHOULD I GET IMMEDIATE MEDICAL CARE? Contact your health care provider right away if:   You have severe abdominal pain.  You are a man and notice swelling or pain in your testicles.  You are a woman and notice swelling or pain in your vagina. Document Released: 11/19/2002 Document Revised: 09/03/2013 Document Reviewed: 03/19/2013 [facility] Patient Information 2015 [place], [place]. This information is not intended to replace advice given to you by your health care provider. Make sure you discuss any questions you have with your health care provider.

## 2015-01-29 NOTE — ED Notes (Signed)
Pt c/o vaginal pain and discharge x 1 week.

## 2015-01-29 NOTE — ED Notes (Signed)
Pt reports frequency and burning with urination; denies vaginal discharge. No more pain with intercourse. Slight pelvic pain.

## 2015-01-30 LAB — GC/CHLAMYDIA PROBE AMP (~~LOC~~) NOT AT ARMC
Chlamydia: POSITIVE — AB
Neisseria Gonorrhea: NEGATIVE

## 2015-01-30 LAB — RPR: RPR: NONREACTIVE

## 2015-01-30 LAB — HIV ANTIBODY (ROUTINE TESTING W REFLEX): HIV SCREEN 4TH GENERATION: NONREACTIVE

## 2015-01-31 LAB — URINE CULTURE: Colony Count: >=100000

## 2015-02-02 ENCOUNTER — Telehealth (HOSPITAL_COMMUNITY): Payer: Self-pay

## 2015-02-02 NOTE — Telephone Encounter (Signed)
Positive for chlamydia. Treated per protocol. [employer]HS Form faxed. Attempting to contact pt.

## 2015-02-02 NOTE — Telephone Encounter (Signed)
Pt returned call. Informed of lab results. Treated per protocol. Advised to abstain from sexual activity x 10 days and to notify partner(s)

## 2015-03-07 ENCOUNTER — Encounter (HOSPITAL_COMMUNITY): Payer: Self-pay | Admitting: *Deleted

## 2015-03-07 ENCOUNTER — Emergency Department (HOSPITAL_COMMUNITY)
Admission: EM | Admit: 2015-03-07 | Discharge: 2015-03-07 | Disposition: A | Discharge status: Home / Self Care | Payer: Medicaid Other | Attending: Emergency Medicine | Admitting: Emergency Medicine

## 2015-03-07 DIAGNOSIS — O10011 Pre-existing essential hypertension complicating pregnancy, first trimester: Secondary | ICD-10-CM | POA: Diagnosis not present

## 2015-03-07 DIAGNOSIS — R1030 Lower abdominal pain, unspecified: Secondary | ICD-10-CM | POA: Insufficient documentation

## 2015-03-07 DIAGNOSIS — Z3A09 9 weeks gestation of pregnancy: Secondary | ICD-10-CM | POA: Insufficient documentation

## 2015-03-07 DIAGNOSIS — Z880 Allergy status to penicillin: Secondary | ICD-10-CM | POA: Insufficient documentation

## 2015-03-07 DIAGNOSIS — G43909 Migraine, unspecified, not intractable, without status migrainosus: Secondary | ICD-10-CM | POA: Diagnosis not present

## 2015-03-07 DIAGNOSIS — R000 Tachycardia, unspecified: Secondary | ICD-10-CM | POA: Insufficient documentation

## 2015-03-07 DIAGNOSIS — Z86018 Personal history of other benign neoplasm: Secondary | ICD-10-CM | POA: Insufficient documentation

## 2015-03-07 DIAGNOSIS — O9989 Other specified diseases and conditions complicating pregnancy, childbirth and the puerperium: Secondary | ICD-10-CM | POA: Insufficient documentation

## 2015-03-07 DIAGNOSIS — O99411 Diseases of the circulatory system complicating pregnancy, first trimester: Secondary | ICD-10-CM | POA: Insufficient documentation

## 2015-03-07 DIAGNOSIS — Z8659 Personal history of other mental and behavioral disorders: Secondary | ICD-10-CM | POA: Diagnosis not present

## 2015-03-07 DIAGNOSIS — A5901 Trichomonal vulvovaginitis: Secondary | ICD-10-CM | POA: Insufficient documentation

## 2015-03-07 DIAGNOSIS — Z791 Long term (current) use of non-steroidal anti-inflammatories (NSAID): Secondary | ICD-10-CM | POA: Diagnosis not present

## 2015-03-07 DIAGNOSIS — Z862 Personal history of diseases of the blood and blood-forming organs and certain disorders involving the immune mechanism: Secondary | ICD-10-CM | POA: Diagnosis not present

## 2015-03-07 DIAGNOSIS — Z87891 Personal history of nicotine dependence: Secondary | ICD-10-CM | POA: Insufficient documentation

## 2015-03-07 DIAGNOSIS — Z79899 Other long term (current) drug therapy: Secondary | ICD-10-CM | POA: Diagnosis not present

## 2015-03-07 DIAGNOSIS — Z3490 Encounter for supervision of normal pregnancy, unspecified, unspecified trimester: Secondary | ICD-10-CM

## 2015-03-07 DIAGNOSIS — O23591 Infection of other part of genital tract in pregnancy, first trimester: Secondary | ICD-10-CM | POA: Diagnosis not present

## 2015-03-07 LAB — CBC
HEMATOCRIT: 35.9 % — AB (ref 36.0–46.0)
HEMOGLOBIN: 12.1 g/dL (ref 12.0–15.0)
MCH: 30.5 pg (ref 26.0–34.0)
MCHC: 33.7 g/dL (ref 30.0–36.0)
MCV: 90.4 fL (ref 78.0–100.0)
PLATELETS: 237 10*3/uL (ref 150–400)
RBC: 3.97 MIL/uL (ref 3.87–5.11)
RDW: 13 % (ref 11.5–15.5)
WBC: 8.8 10*3/uL (ref 4.0–10.5)

## 2015-03-07 LAB — URINALYSIS, ROUTINE W REFLEX MICROSCOPIC
Bilirubin Urine: NEGATIVE
Glucose, UA: NEGATIVE mg/dL
HGB URINE DIPSTICK: NEGATIVE
Ketones, ur: NEGATIVE mg/dL
Nitrite: NEGATIVE
PH: 5.5 (ref 5.0–8.0)
Protein, ur: NEGATIVE mg/dL
SPECIFIC GRAVITY, URINE: 1.03 (ref 1.005–1.030)
UROBILINOGEN UA: 0.2 mg/dL (ref 0.0–1.0)

## 2015-03-07 LAB — URINE MICROSCOPIC-ADD ON

## 2015-03-07 LAB — WET PREP, GENITAL: Yeast Wet Prep HPF POC: NONE SEEN

## 2015-03-07 LAB — BASIC METABOLIC PANEL
Anion gap: 8 (ref 5–15)
BUN: <5 mg/dL — ABNORMAL LOW (ref 6–20)
CO2: 23 mmol/L (ref 22–32)
Calcium: 8.9 mg/dL (ref 8.9–10.3)
Chloride: 104 mmol/L (ref 101–111)
Creatinine, Ser: 0.9 mg/dL (ref 0.44–1.00)
GFR calc Af Amer: >60 mL/min (ref >60)
GLUCOSE: 115 mg/dL — AB (ref 65–99)
Potassium: 3.3 mmol/L — ABNORMAL LOW (ref 3.5–5.1)
Sodium: 135 mmol/L (ref 135–145)

## 2015-03-07 LAB — HCG, QUANTITATIVE, PREGNANCY: HCG, BETA CHAIN, QUANT, S: 43060 m[IU]/mL — AB (ref <5)

## 2015-03-07 LAB — OB RESULTS CONSOLE GC/CHLAMYDIA: Gonorrhea: NEGATIVE

## 2015-03-07 LAB — POC URINE PREG, ED: PREG TEST UR: POSITIVE — AB

## 2015-03-07 MED ORDER — METRONIDAZOLE 500 MG PO TABS
2000.0000 mg | ORAL_TABLET | Freq: Once | ORAL | Status: AC
  Administered 2015-03-07: 2000 mg via ORAL
  Filled 2015-03-07: qty 4

## 2015-03-07 NOTE — ED Notes (Signed)
PT took a pregnancy test and said it was positive and she wants to get checked out.  LMP 33months.  No spotting, reports lower abdominal pain

## 2015-03-07 NOTE — Discharge Instructions (Signed)
Please read and follow all provided instructions.  Your diagnoses today include:  1. Lower abdominal pain   2. Trichomonas vaginitis   3. Pregnancy     Tests performed today include:  Blood counts and electrolytes  Blood tests to check kidney function  Urine test - no infection  Wet prep - shows trichomonas infection  Ultrasound - shows pregnancy in the uterus  Vital signs. See below for your results today.   Medications prescribed:   None  Take any prescribed medications only as directed.  Home care instructions:   Follow any educational materials contained in this packet.  Follow-up instructions: Please follow-up with your primary care provider in the next 3 days for further evaluation of your symptoms.    Return instructions:  SEEK IMMEDIATE MEDICAL ATTENTION IF:  The pain does not go away or becomes severe   A temperature above 101F develops   Repeated vomiting occurs (multiple episodes)   The pain becomes localized to portions of the abdomen. The right side could possibly be appendicitis. In an adult, the left lower portion of the abdomen could be colitis or diverticulitis.   Blood is being passed in stools or vomit (bright red or black tarry stools)   You develop chest pain, difficulty breathing, dizziness or fainting, or become confused, poorly responsive, or inconsolable (young children)  If you have any other emergent concerns regarding your health  Additional Information: Abdominal (belly) pain can be caused by many things. Your caregiver performed an examination and possibly ordered blood/urine tests and imaging (CT scan, x-rays, ultrasound). Many cases can be observed and treated at home after initial evaluation in the emergency department. Even though you are being discharged home, abdominal pain can be unpredictable. Therefore, you need a repeated exam if your pain does not resolve, returns, or worsens. Most patients with abdominal pain don't have to  be admitted to the hospital or have surgery, but serious problems like appendicitis and gallbladder attacks can start out as nonspecific pain. Many abdominal conditions cannot be diagnosed in one visit, so follow-up evaluations are very important.  Your vital signs today were: BP 137/90 mmHg   Pulse 107   Temp(Src) 99 F (37.2 C) (Oral)   Resp 16   Ht 4\' 9"  (1.448 m)   Wt 120 lb (54.432 kg)   BMI 25.96 kg/m2   SpO2 100%   LMP  If your blood pressure (bp) was elevated above 135/85 this visit, please have this repeated by your doctor within one month. --------------

## 2015-03-07 NOTE — ED Provider Notes (Signed)
EMERGENCY DEPARTMENT [place] PREGNANCY "Study: Limited Ultrasound of the Pelvis"  INDICATIONS:Pregnancy(required) Multiple views of the uterus and pelvic cavity are obtained with a multi-frequency probe.  APPROACH:Transabdominal   PERFORMED BY: Myself  IMAGES ARCHIVED?: Yes  LIMITATIONS: none  PREGNANCY FREE FLUID: None  PREGNANCY UTERUS FINDINGS:Uterus enlarged and Gestational sac noted ADNEXAL FINDINGS:Left ovary not seen and Right ovary not seen  PREGNANCY FINDINGS: Intrauterine gestational sac noted and Fetal heart activity seen  INTERPRETATION: Viable intrauterine pregnancy and Pelvic free fluid absent  GESTATIONAL AGE, ESTIMATE: [redacted]w[redacted]d  FETAL HEART RATE: 188  COMMENT(Estimate of Gestational Age):  Uncomplicated IUP  Patient presenting after a positive pregnancy test and pressure in her lower abdomen. She denies any vaginal bleeding or pain. She does have a prior history of ectopic pregnancy status post surgery. On exam today patient has had no notable abdominal tenderness. Bedside ultrasound shows an uncomplicated IUP.  Blanchie Dessert, MD 03/07/15 1425

## 2015-03-07 NOTE — ED Notes (Signed)
PA at bedside.

## 2015-03-07 NOTE — ED Provider Notes (Signed)
CSN: [id number]     Arrival date & time 03/07/15  1217 History   First MD Initiated Contact with Patient 03/07/15 1228     Chief Complaint  Patient presents with  . Routine Prenatal Visit  . Abdominal Pain     (Consider location/radiation/quality/duration/timing/severity/associated sxs/prior Treatment) HPI Comments: Patient who is G5P4, history of ectopic pregnancy resolved with surgery, last child born in July 2015 presents with complaint of lower abdominal pain that is intermittent over the past several days. Last menstrual period was 12/31/2014. Pain is worse with standing and walking. It is worse when holding her young son. She denies vaginal bleeding or discharge. Pain is midline. No dysuria, hematuria, increased urgency, increased frequency. Pain does not radiate. No fevers, nausea, vomiting, or diarrhea. No treatments prior to arrival. The onset of this condition was acute. The course is constant. Aggravating factors: none. Alleviating factors: none.    Patient is a 34 y.o. female presenting with abdominal pain. The history is provided by the patient.  Abdominal Pain Associated symptoms: no chest pain, no cough, no diarrhea, no dysuria, no fever, no nausea, no sore throat and no vomiting     Past Medical History  Diagnosis Date  . Hypertension   . Chlamydia   . Gonorrhea   . Trichomonas   . Migraines     but not diagnosed  . Ectopic pregnancy   . Preterm delivery   . Fibroid   . Pregnancy induced hypertension   . Mental disorder   . Anemia    Past Surgical History  Procedure Laterality Date  . Dilation and curettage of uterus    . Ectopic pregnancy surgery    . Cesarean section N/A 03/21/2014    Procedure: CESAREAN SECTION;  Surgeon: Osborne Oman, MD;  Location: [facility];  Service: Obstetrics;  Laterality: N/A;   Family History  Problem Relation Age of Onset  . Hypertension Mother   . Hypertension Maternal Grandmother   . Cancer Maternal Grandfather    History   Substance Use Topics  . Smoking status: Former Smoker -- 0.25 packs/day    Types: Cigarettes    Quit date: 08/13/2013  . Smokeless tobacco: Never Used  . Alcohol Use: Yes     Comment: occ   OB History    Gravida Para Term Preterm AB TAB SAB Ectopic Multiple Living   4 3 0 3 1   1  3      Review of Systems  Constitutional: Negative for fever.  HENT: Negative for rhinorrhea and sore throat.   Eyes: Negative for redness.  Respiratory: Negative for cough.   Cardiovascular: Negative for chest pain.  Gastrointestinal: Positive for abdominal pain. Negative for nausea, vomiting and diarrhea.  Genitourinary: Negative for dysuria.  Musculoskeletal: Negative for myalgias.  Skin: Negative for rash.  Neurological: Negative for headaches.      Allergies  Amoxicillin  Home Medications   Prior to Admission medications   Medication Sig Start Date End Date Taking? Authorizing Provider  acetaminophen-codeine (TYLENOL #3) 300-30 MG per tablet Take 2 tablets by mouth every 4 (four) hours as needed for moderate pain. 03/24/14   Donnamae Jude, MD  amLODipine (NORVASC) 5 MG tablet Take 1 tablet (5 mg total) by mouth daily. 04/10/14   Woodroe Mode, MD  azithromycin [facility]) 1 G powder Take 1 packet (1 g total) by mouth once. 01/29/15   Hope Bunnie Pion, NP  hydrochlorothiazide (HYDRODIURIL) 25 MG tablet Take 1 tablet (25 mg total) by  mouth daily. 03/24/14   Donnamae Jude, MD  ibuprofen (ADVIL,MOTRIN) 600 MG tablet Take 1 tablet (600 mg total) by mouth every 6 (six) hours. 03/24/14   Donnamae Jude, MD  medroxyPROGESTERone (DEPO-SUBQ PROVERA 104) 104 MG/0.65ML injection Inject 104 mg into the skin every 3 (three) months.    Historical Provider, MD  metroNIDAZOLE (FLAGYL) 500 MG tablet Take 1 tablet (500 mg total) by mouth 2 (two) times daily. 01/29/15   Hope Bunnie Pion, NP  naproxen (NAPROSYN) 500 MG tablet Take 1 tablet (500 mg total) by mouth 2 (two) times daily. 05/22/14   Antonietta Breach, PA-C   BP 141/83  mmHg  Pulse 111  Temp(Src) 99 F (37.2 C) (Oral)  Resp 16  Ht 4\' 9"  (1.448 m)  Wt 120 lb (54.432 kg)  BMI 25.96 kg/m2  SpO2 99%  LMP    Physical Exam  Constitutional: She appears well-developed and well-nourished.  HENT:  Head: Normocephalic and atraumatic.  Eyes: Conjunctivae are normal. Right eye exhibits no discharge. Left eye exhibits no discharge.  Neck: Normal range of motion. Neck supple.  Cardiovascular: Regular rhythm and normal heart sounds.  Tachycardia present.   Pulmonary/Chest: Effort normal and breath sounds normal.  Abdominal: Soft. There is no tenderness. There is no rebound and no guarding.  Genitourinary: Uterus normal. There is no rash or tenderness on the right labia. There is no rash or tenderness on the left labia. Cervix exhibits no motion tenderness, no discharge and no friability. Right adnexum displays no mass, no tenderness and no fullness. Left adnexum displays no mass, no tenderness and no fullness. No erythema or tenderness in the vagina. Vaginal discharge (thick white) found.  Neurological: She is alert.  Skin: Skin is warm and dry.  Psychiatric: She has a normal mood and affect.  Nursing note and vitals reviewed.   ED Course  Procedures (including critical care time) Labs Review Labs Reviewed  WET PREP, GENITAL - Abnormal; Notable for the following:    Trich, Wet Prep FEW (*)    Clue Cells Wet Prep HPF POC FEW (*)    WBC, Wet Prep HPF POC MODERATE (*)    All other components within normal limits  URINALYSIS, ROUTINE W REFLEX MICROSCOPIC (NOT AT [facility]) - Abnormal; Notable for the following:    APPearance TURBID (*)    Leukocytes, UA TRACE (*)    All other components within normal limits  HCG, QUANTITATIVE, PREGNANCY - Abnormal; Notable for the following:    hCG, Beta Chain, Quant, S 43060 (*)    All other components within normal limits  CBC - Abnormal; Notable for the following:    HCT 35.9 (*)    All other components within normal limits   BASIC METABOLIC PANEL - Abnormal; Notable for the following:    Potassium 3.3 (*)    Glucose, Bld 115 (*)    BUN <5 (*)    All other components within normal limits  URINE MICROSCOPIC-ADD ON - Abnormal; Notable for the following:    Squamous Epithelial / LPF MANY (*)    Bacteria, UA MANY (*)    Casts HYALINE CASTS (*)    All other components within normal limits  POC URINE PREG, ED - Abnormal; Notable for the following:    Preg Test, Ur POSITIVE (*)    All other components within normal limits  GC/CHLAMYDIA PROBE AMP ([place]) NOT AT [facility]    Imaging Review No results found.   EKG Interpretation None  12:38 PM Patient seen and examined. Work-up initiated. She is currently asymptomatic, but given previous ectopic, she needs work-up to ensure IUP. Labs ordered. Will perform pelvic exam.   Vital signs reviewed and are as follows: BP 141/83 mmHg  Pulse 111  Temp(Src) 99 F (37.2 C) (Oral)  Resp 16  Ht 4\' 9"  (1.448 m)  Wt 120 lb (54.432 kg)  BMI 25.96 kg/m2  SpO2 99%  LMP   2:13 PM Bedside US by Dr. Maryan Rued. IUP confirmed. Formal US canceled.   3:33 PM Pelvic exam performed with nurse chaperone.   Wet prep demonstrates trichomonas. Patient given flagyl.   Encouraged OB/GYN f/u. The patient was urged to return to the Emergency Department immediately with worsening of current symptoms, worsening abdominal pain, persistent vomiting, blood noted in stools, fever, or any other concerns. The patient verbalized understanding.     MDM   Final diagnoses:  Lower abdominal pain  Trichomonas vaginitis  Pregnancy   Patient with intermittent, not constant, lower abdominal/pelvic pain in setting of pregnancy. History of previous ectopic pregnancy. Bedside ultrasound demonstrates intrauterine pregnancy. Labs are otherwise reassuring. Pelvic exam performed with wet prep showing trichomoniasis. Patient treated for this. She was recently treated for this as well. Urged  follow-up with her OB/GYN.    Carlisle Cater, PA-C 03/07/15 1535  Blanchie Dessert, MD 03/08/15 1012

## 2015-03-09 LAB — GC/CHLAMYDIA PROBE AMP (~~LOC~~) NOT AT ARMC
Chlamydia: NEGATIVE
Neisseria Gonorrhea: NEGATIVE

## 2015-08-30 IMAGING — US US MFM OB TRANSVAGINAL
1 series · 13 of 18 positions shown · non-contrast
Comparison: none

[Series 1: us mfm ob transvaginal · 18 acquisitions, 13 frames shown]
[im 1/18]
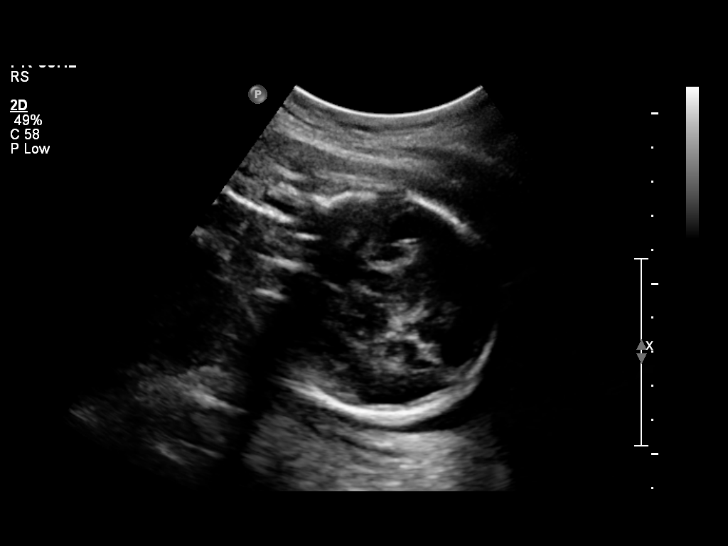
[im 3/18]
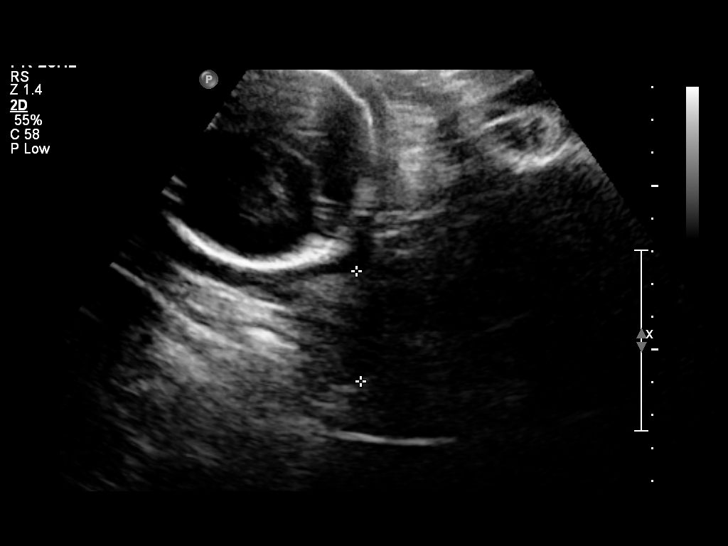
[im 4/18]
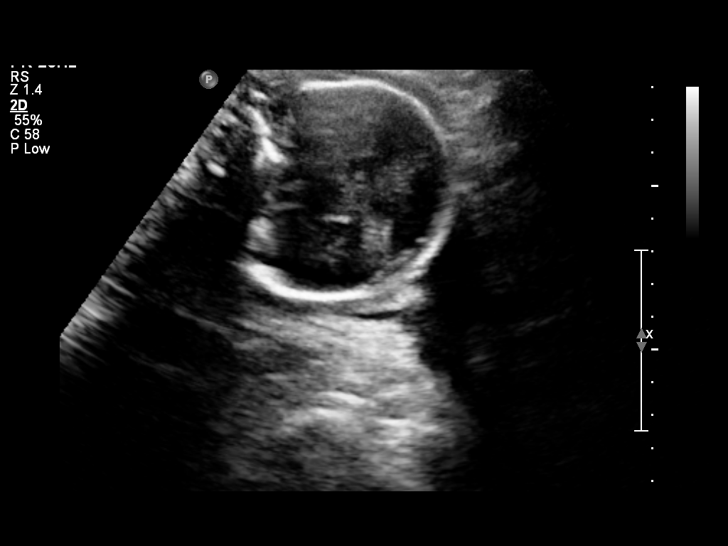
[im 5/18]
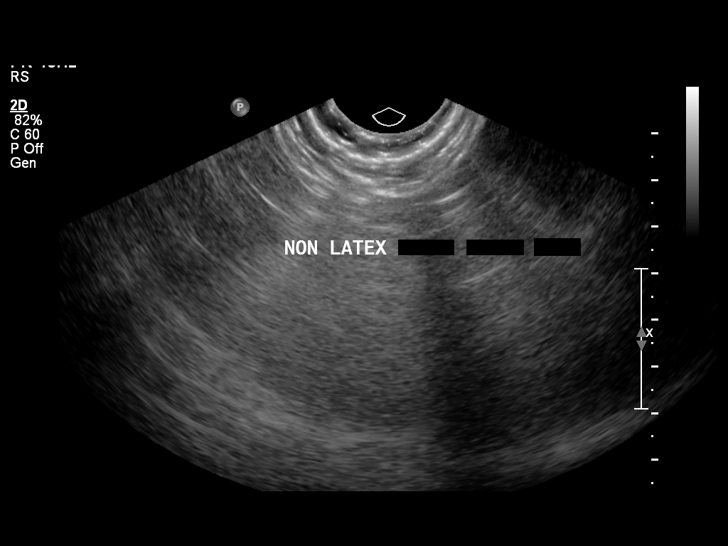
[im 7/18]
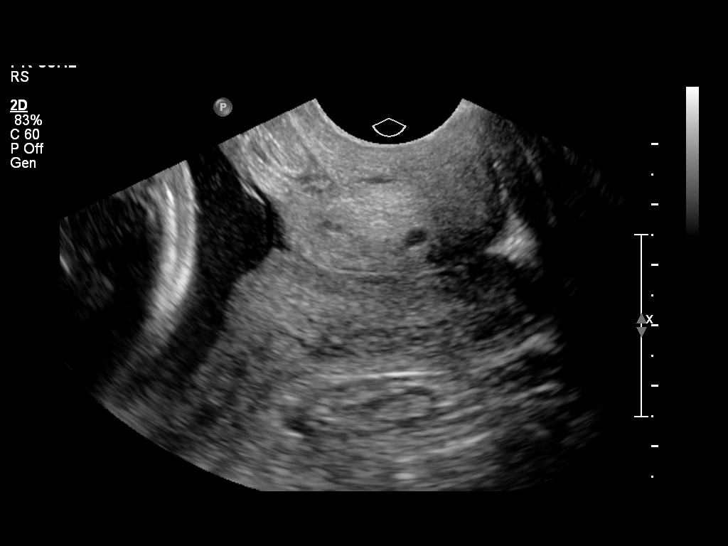
[im 8/18]
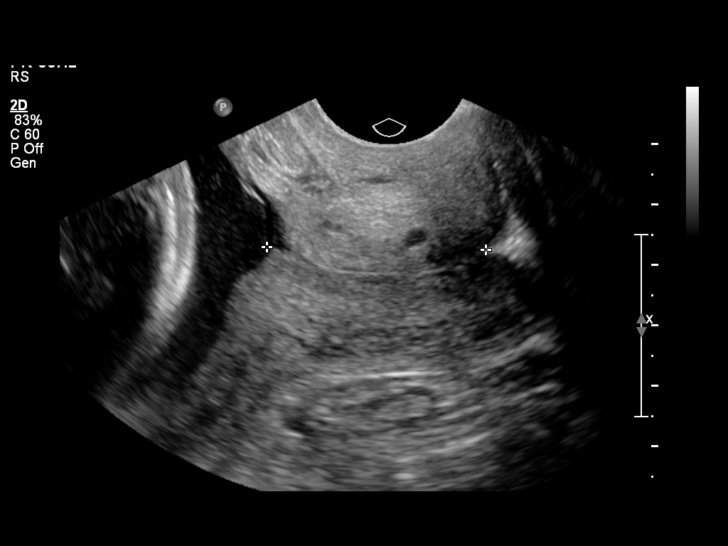
[im 10/18]
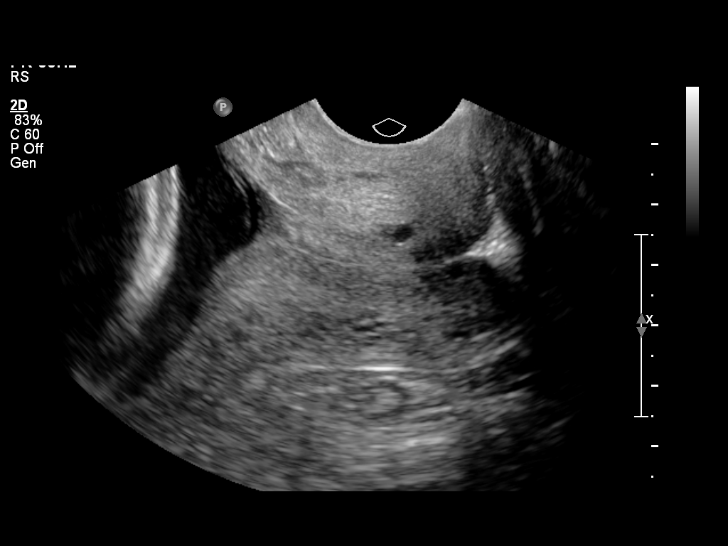
[im 11/18]
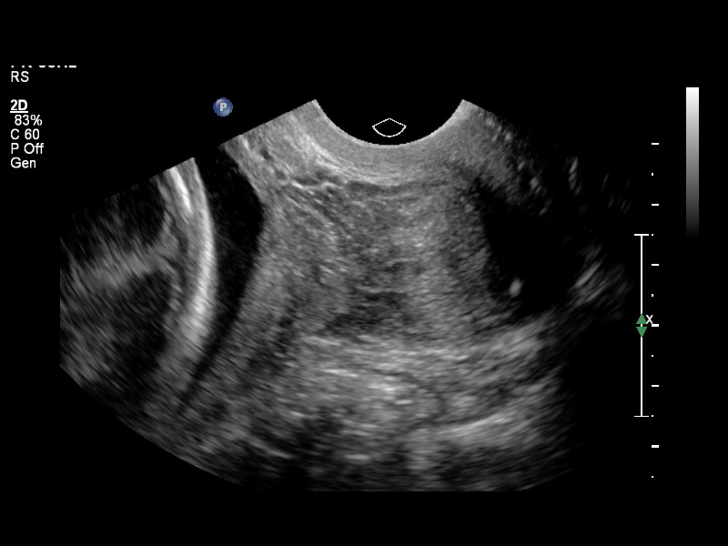
[im 12/18]
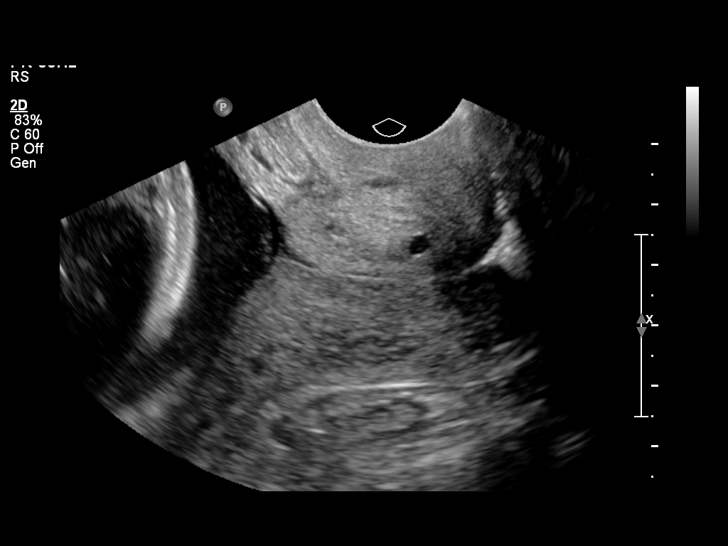
[im 14/18]
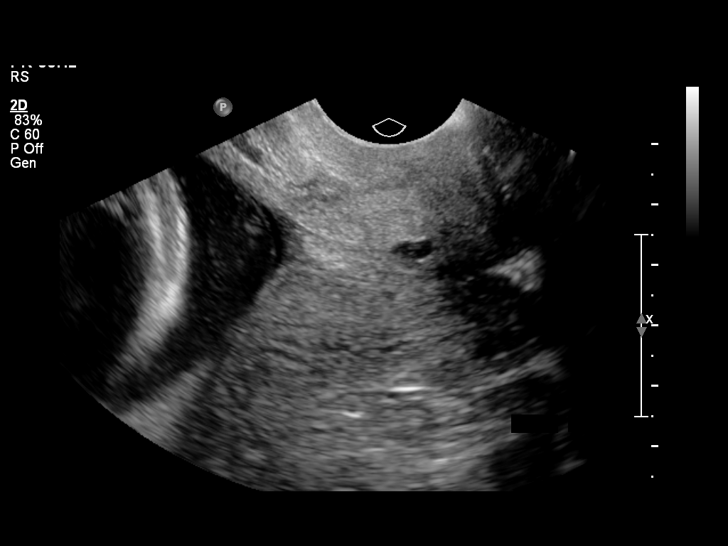
[im 15/18]
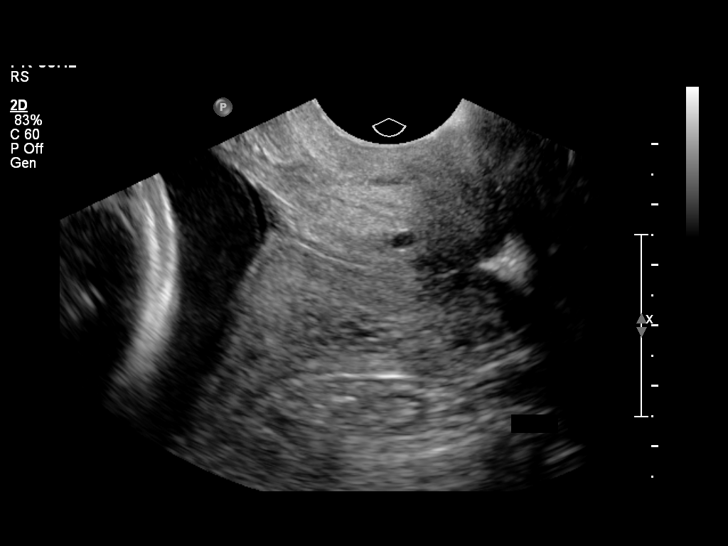
[im 16/18]
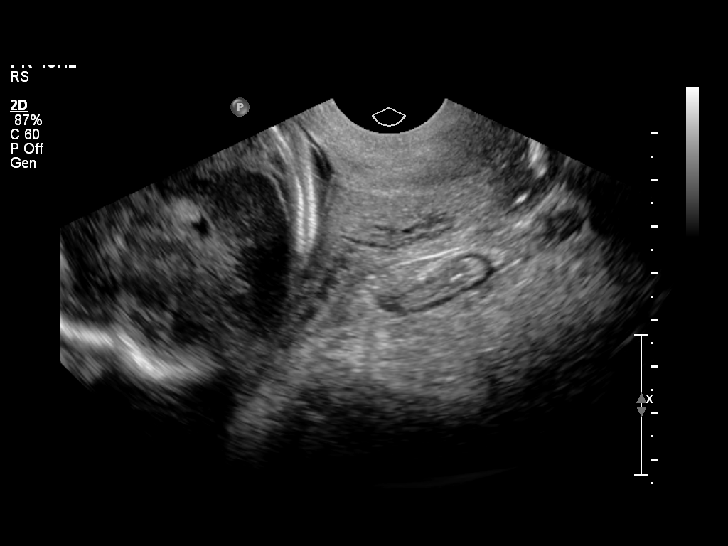
[im 18/18]
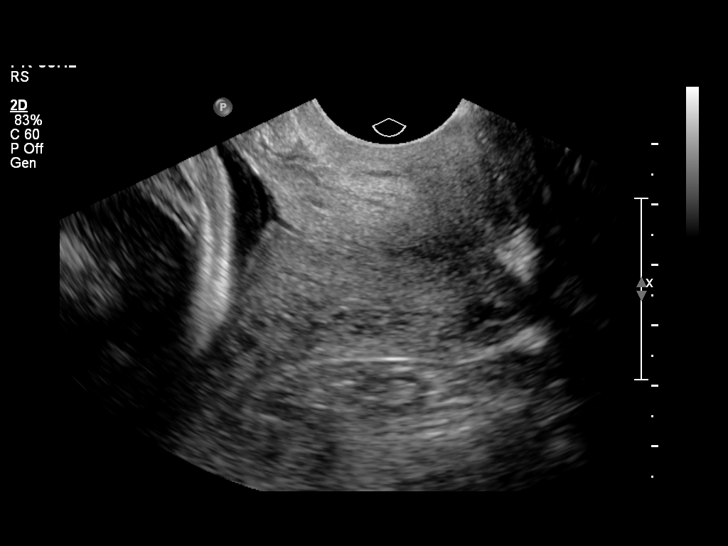

[13 of 18 positions shown; findings below may reference images not displayed]

OBSTETRICS REPORT
                      (Signed Final 01/21/2014 [DATE])

                                                         MAU/Triage
Service(s) Provided

 US MFM OB TRANSVAGINAL                                76817.2
Indications

 Poor obstetric history: Previous preterm delivery
 (28, ?35 weeks)
 Hypertension - Chronic/Pre-existing
 No or Little Prenatal Care
 Poor obstetrical history (cervical insuffiency,
 previous cerclage)
Fetal Evaluation

 Num Of Fetuses:    1
 Fetal Heart Rate:  142                          bpm
 Cardiac Activity:  Observed
 Presentation:      Cephalic
Gestational Age

 Best:          25w 5d     Det. By:  Early Ultrasound         EDD:   05/01/14
Cervix Uterus Adnexa

 Cervical Length:    3.62     cm

 Cervix:       Normal appearance by transvaginal scan
Impression

 SIUP at 63w3d for remote interpretation of cervical length only
 Endovaginal imaging demonstrates a long and closed cervix
 measuring 3.62cm
Recommendations

 In setting of history of cervical insufficiency and HTN,
 recommend:
 -q2wk cervical length until 28 weeks
 -monthly interval growth
 -initiation of antenatal testing at 32 weeks

 Given that this was a remotely read examination, please call
 to schedule if above recommendations are desired in our unit.
 questions or concerns.

## 2015-09-19 ENCOUNTER — Encounter (HOSPITAL_COMMUNITY): Payer: Self-pay | Admitting: Obstetrics and Gynecology

## 2015-09-19 ENCOUNTER — Inpatient Hospital Stay (HOSPITAL_COMMUNITY)
Admission: AD | Admit: 2015-09-19 | Discharge: 2015-09-21 | DRG: 775 | Disposition: A | Discharge status: Home / Self Care | Payer: Medicaid Other | Source: Ambulatory Visit | Attending: Obstetrics and Gynecology | Admitting: Obstetrics and Gynecology

## 2015-09-19 DIAGNOSIS — Z809 Family history of malignant neoplasm, unspecified: Secondary | ICD-10-CM

## 2015-09-19 DIAGNOSIS — O0933 Supervision of pregnancy with insufficient antenatal care, third trimester: Secondary | ICD-10-CM

## 2015-09-19 DIAGNOSIS — Z3A35 35 weeks gestation of pregnancy: Secondary | ICD-10-CM | POA: Diagnosis not present

## 2015-09-19 DIAGNOSIS — Z87891 Personal history of nicotine dependence: Secondary | ICD-10-CM

## 2015-09-19 DIAGNOSIS — O34211 Maternal care for low transverse scar from previous cesarean delivery: Secondary | ICD-10-CM | POA: Diagnosis present

## 2015-09-19 DIAGNOSIS — Z3493 Encounter for supervision of normal pregnancy, unspecified, third trimester: Secondary | ICD-10-CM

## 2015-09-19 DIAGNOSIS — Z8249 Family history of ischemic heart disease and other diseases of the circulatory system: Secondary | ICD-10-CM | POA: Diagnosis not present

## 2015-09-19 DIAGNOSIS — O10919 Unspecified pre-existing hypertension complicating pregnancy, unspecified trimester: Secondary | ICD-10-CM | POA: Diagnosis present

## 2015-09-19 DIAGNOSIS — O365930 Maternal care for other known or suspected poor fetal growth, third trimester, not applicable or unspecified: Secondary | ICD-10-CM | POA: Diagnosis present

## 2015-09-19 DIAGNOSIS — Z590 Homelessness: Secondary | ICD-10-CM | POA: Diagnosis not present

## 2015-09-19 DIAGNOSIS — F1410 Cocaine abuse, uncomplicated: Secondary | ICD-10-CM | POA: Diagnosis present

## 2015-09-19 DIAGNOSIS — O99324 Drug use complicating childbirth: Secondary | ICD-10-CM | POA: Diagnosis present

## 2015-09-19 DIAGNOSIS — O119 Pre-existing hypertension with pre-eclampsia, unspecified trimester: Secondary | ICD-10-CM | POA: Diagnosis present

## 2015-09-19 DIAGNOSIS — O139 Gestational [pregnancy-induced] hypertension without significant proteinuria, unspecified trimester: Secondary | ICD-10-CM | POA: Diagnosis present

## 2015-09-19 LAB — RAPID HIV SCREEN (HIV 1/2 AB+AG)
HIV 1/2 Antibodies: NONREACTIVE
HIV-1 P24 Antigen - HIV24: NONREACTIVE

## 2015-09-19 LAB — COMPREHENSIVE METABOLIC PANEL
ALT: 12 U/L — ABNORMAL LOW (ref 14–54)
AST: 19 U/L (ref 15–41)
Albumin: 3.2 g/dL — ABNORMAL LOW (ref 3.5–5.0)
Alkaline Phosphatase: 172 U/L — ABNORMAL HIGH (ref 38–126)
Anion gap: 6 (ref 5–15)
BUN: 9 mg/dL (ref 6–20)
CHLORIDE: 105 mmol/L (ref 101–111)
CO2: 24 mmol/L (ref 22–32)
CREATININE: 0.67 mg/dL (ref 0.44–1.00)
Calcium: 8.9 mg/dL (ref 8.9–10.3)
Glucose, Bld: 100 mg/dL — ABNORMAL HIGH (ref 65–99)
Potassium: 4.1 mmol/L (ref 3.5–5.1)
Sodium: 135 mmol/L (ref 135–145)
Total Bilirubin: 0.3 mg/dL (ref 0.3–1.2)
Total Protein: 6.4 g/dL — ABNORMAL LOW (ref 6.5–8.1)

## 2015-09-19 LAB — CBC
HCT: 40.5 % (ref 36.0–46.0)
Hemoglobin: 13.8 g/dL (ref 12.0–15.0)
MCH: 31.2 pg (ref 26.0–34.0)
MCHC: 34.1 g/dL (ref 30.0–36.0)
MCV: 91.4 fL (ref 78.0–100.0)
PLATELETS: 156 10*3/uL (ref 150–400)
RBC: 4.43 MIL/uL (ref 3.87–5.11)
RDW: 13.4 % (ref 11.5–15.5)
WBC: 14.9 10*3/uL — ABNORMAL HIGH (ref 4.0–10.5)

## 2015-09-19 LAB — PROTEIN / CREATININE RATIO, URINE
Creatinine, Urine: 52 mg/dL
PROTEIN CREATININE RATIO: 0.62 mg/mg{creat} — AB (ref 0.00–0.15)
Total Protein, Urine: 32 mg/dL

## 2015-09-19 LAB — MRSA PCR SCREENING: MRSA BY PCR: POSITIVE — AB

## 2015-09-19 LAB — PREPARE RBC (CROSSMATCH)

## 2015-09-19 LAB — RAPID URINE DRUG SCREEN, HOSP PERFORMED
Amphetamines: NOT DETECTED
Barbiturates: NOT DETECTED
Benzodiazepines: NOT DETECTED
COCAINE: POSITIVE — AB
OPIATES: NOT DETECTED
TETRAHYDROCANNABINOL: NOT DETECTED

## 2015-09-19 LAB — RPR: RPR: NONREACTIVE

## 2015-09-19 MED ORDER — LIDOCAINE HCL (PF) 1 % IJ SOLN
INTRAMUSCULAR | Status: AC
  Filled 2015-09-19: qty 30

## 2015-09-19 MED ORDER — LACTATED RINGERS IV SOLN
INTRAVENOUS | Status: AC
  Administered 2015-09-19: 09:00:00
  Filled 2015-09-19: qty 4

## 2015-09-19 MED ORDER — LIDOCAINE HCL (PF) 1 % IJ SOLN
30.0000 mL | INTRAMUSCULAR | Status: DC | PRN
  Filled 2015-09-19: qty 30

## 2015-09-19 MED ORDER — LACTATED RINGERS IV SOLN: 500.0000 mL | INTRAVENOUS | Status: DC | PRN

## 2015-09-19 MED ORDER — LABETALOL HCL 5 MG/ML IV SOLN
20.0000 mg | INTRAVENOUS | Status: AC | PRN
  Administered 2015-09-19 (×3): 20 mg via INTRAVENOUS
  Filled 2015-09-19 (×3): qty 4

## 2015-09-19 MED ORDER — CITRIC ACID-SODIUM CITRATE 334-500 MG/5ML PO SOLN
30.0000 mL | ORAL | Status: DC | PRN
  Filled 2015-09-19: qty 30

## 2015-09-19 MED ORDER — OXYCODONE-ACETAMINOPHEN 5-325 MG PO TABS: 1.0000 | ORAL_TABLET | ORAL | Status: DC | PRN

## 2015-09-19 MED ORDER — MENTHOL 3 MG MT LOZG
1.0000 | LOZENGE | OROMUCOSAL | Status: DC | PRN
  Administered 2015-09-19: 3 mg via ORAL
  Filled 2015-09-19: qty 9

## 2015-09-19 MED ORDER — MAGNESIUM SULFATE BOLUS VIA INFUSION
4.0000 g | Freq: Once | INTRAVENOUS | Status: AC
  Administered 2015-09-19: 4 g via INTRAVENOUS
  Filled 2015-09-19: qty 500

## 2015-09-19 MED ORDER — IBUPROFEN 600 MG PO TABS
600.0000 mg | ORAL_TABLET | Freq: Four times a day (QID) | ORAL | Status: DC
  Administered 2015-09-19 – 2015-09-21 (×9): 600 mg via ORAL
  Filled 2015-09-19 (×9): qty 1

## 2015-09-19 MED ORDER — MAGNESIUM SULFATE 50 % IJ SOLN
2.0000 g/h | INTRAVENOUS | Status: AC
  Administered 2015-09-20: 2 g/h via INTRAVENOUS
  Filled 2015-09-19 (×2): qty 80

## 2015-09-19 MED ORDER — DIBUCAINE 1 % RE OINT: 1.0000 "application " | TOPICAL_OINTMENT | RECTAL | Status: DC | PRN

## 2015-09-19 MED ORDER — CHLORHEXIDINE GLUCONATE CLOTH 2 % EX PADS
6.0000 | MEDICATED_PAD | Freq: Every day | CUTANEOUS | Status: DC
  Administered 2015-09-20 – 2015-09-21 (×2): 6 via TOPICAL

## 2015-09-19 MED ORDER — LACTATED RINGERS IV SOLN
INTRAVENOUS | Status: DC
  Administered 2015-09-19 – 2015-09-20 (×2): via INTRAVENOUS

## 2015-09-19 MED ORDER — PRENATAL MULTIVITAMIN CH
1.0000 | ORAL_TABLET | Freq: Every day | ORAL | Status: DC
  Administered 2015-09-19 – 2015-09-21 (×3): 1 via ORAL
  Filled 2015-09-19 (×3): qty 1

## 2015-09-19 MED ORDER — LANOLIN HYDROUS EX OINT: TOPICAL_OINTMENT | CUTANEOUS | Status: DC | PRN

## 2015-09-19 MED ORDER — SENNOSIDES-DOCUSATE SODIUM 8.6-50 MG PO TABS
2.0000 | ORAL_TABLET | ORAL | Status: DC
  Administered 2015-09-20 (×2): 2 via ORAL
  Filled 2015-09-19 (×2): qty 2

## 2015-09-19 MED ORDER — OXYTOCIN BOLUS FROM INFUSION
500.0000 mL | INTRAVENOUS | Status: DC
  Administered 2015-09-19: 500 mL via INTRAVENOUS

## 2015-09-19 MED ORDER — ACETAMINOPHEN 325 MG PO TABS: 650.0000 mg | ORAL_TABLET | ORAL | Status: DC | PRN

## 2015-09-19 MED ORDER — ONDANSETRON HCL 4 MG/2ML IJ SOLN: 4.0000 mg | Freq: Four times a day (QID) | INTRAMUSCULAR | Status: DC | PRN

## 2015-09-19 MED ORDER — WITCH HAZEL-GLYCERIN EX PADS
1.0000 | MEDICATED_PAD | CUTANEOUS | Status: DC | PRN
Start: 2015-09-19 — End: 2015-09-21

## 2015-09-19 MED ORDER — OXYCODONE-ACETAMINOPHEN 5-325 MG PO TABS: 2.0000 | ORAL_TABLET | ORAL | Status: DC | PRN

## 2015-09-19 MED ORDER — BENZOCAINE-MENTHOL 20-0.5 % EX AERO: 1.0000 "application " | INHALATION_SPRAY | CUTANEOUS | Status: DC | PRN

## 2015-09-19 MED ORDER — LABETALOL HCL 200 MG PO TABS
200.0000 mg | ORAL_TABLET | Freq: Two times a day (BID) | ORAL | Status: DC
  Administered 2015-09-19 – 2015-09-21 (×5): 200 mg via ORAL
  Filled 2015-09-19 (×7): qty 1

## 2015-09-19 MED ORDER — DIPHENHYDRAMINE HCL 25 MG PO CAPS: 25.0000 mg | ORAL_CAPSULE | Freq: Four times a day (QID) | ORAL | Status: DC | PRN

## 2015-09-19 MED ORDER — CLINDAMYCIN PHOSPHATE 900 MG/50ML IV SOLN
900.0000 mg | Freq: Once | INTRAVENOUS | Status: DC
  Filled 2015-09-19: qty 50

## 2015-09-19 MED ORDER — ONDANSETRON HCL 4 MG PO TABS: 4.0000 mg | ORAL_TABLET | ORAL | Status: DC | PRN

## 2015-09-19 MED ORDER — ONDANSETRON HCL 4 MG/2ML IJ SOLN: 4.0000 mg | INTRAMUSCULAR | Status: DC | PRN

## 2015-09-19 MED ORDER — FENTANYL CITRATE (PF) 100 MCG/2ML IJ SOLN: 100.0000 ug | INTRAMUSCULAR | Status: DC | PRN

## 2015-09-19 MED ORDER — TETANUS-DIPHTH-ACELL PERTUSSIS 5-2.5-18.5 LF-MCG/0.5 IM SUSP
0.5000 mL | Freq: Once | INTRAMUSCULAR | Status: DC
  Filled 2015-09-19 (×2): qty 0.5

## 2015-09-19 MED ORDER — ZOLPIDEM TARTRATE 5 MG PO TABS
5.0000 mg | ORAL_TABLET | Freq: Every evening | ORAL | Status: DC | PRN
Start: 2015-09-19 — End: 2015-09-21

## 2015-09-19 MED ORDER — OXYTOCIN 10 UNIT/ML IJ SOLN
2.5000 [IU]/h | INTRAVENOUS | Status: DC
  Administered 2015-09-19 (×2): 2.5 [IU]/h via INTRAVENOUS

## 2015-09-19 MED ORDER — HYDRALAZINE HCL 20 MG/ML IJ SOLN
10.0000 mg | Freq: Once | INTRAMUSCULAR | Status: AC | PRN
  Administered 2015-09-19: 10 mg via INTRAVENOUS
  Filled 2015-09-19: qty 1

## 2015-09-19 MED ORDER — SALINE SPRAY 0.65 % NA SOLN
1.0000 | NASAL | Status: DC | PRN
  Administered 2015-09-19: 1 via NASAL
  Filled 2015-09-19 (×2): qty 44

## 2015-09-19 MED ORDER — LACTATED RINGERS IV SOLN
INTRAVENOUS | Status: DC
  Administered 2015-09-19: 06:00:00 via INTRAVENOUS

## 2015-09-19 MED ORDER — SIMETHICONE 80 MG PO CHEW
80.0000 mg | CHEWABLE_TABLET | ORAL | Status: DC | PRN
  Administered 2015-09-19: 80 mg via ORAL
  Filled 2015-09-19: qty 1

## 2015-09-19 MED ORDER — MUPIROCIN 2 % EX OINT
1.0000 | TOPICAL_OINTMENT | Freq: Two times a day (BID) | CUTANEOUS | Status: DC
Start: 2015-09-19 — End: 2015-09-21
  Administered 2015-09-19 – 2015-09-21 (×5): 1 via NASAL
  Filled 2015-09-19: qty 22

## 2015-09-19 NOTE — Progress Notes (Signed)
Dr Glo Herring has been at bedside with pt, reviewing fhm strip, explained plan of care to pt, hard to monitor externally due to pt moving around in bed, MD applied scalp electrode.  MD and RN x 2 giving pt ongoing emotional support.  Orders to transfer to birthing suites at this time. To birthing suites via stretcher.  Shelton Silvas gave report to [facility].

## 2015-09-19 NOTE — Progress Notes (Signed)
Pt arrived by EMS.  Reports no prenatal care,  last used Cocaine 1 week ago and would like to give baby up for adoption.

## 2015-09-19 NOTE — MAU Note (Signed)
Leaking at 3 pm. Clear fluid with tiny clot.  Baby moving well. Contractions every 7 min . No Prenatal Care.  Went to [facility] ER once.

## 2015-09-19 NOTE — Progress Notes (Signed)
Patient arrived to room via stretcher and baby delivered on stretcher upon arrival. Dr. Glo Herring called.

## 2015-09-19 NOTE — Progress Notes (Addendum)
Patient ID: Marissa Meyer, female   DOB: 02/26/1981, 35 y.o.   MRN: [id number] Delivery Note  The patient progressed rapidly in MAU while Mag Sulfate being started, and initial Apresoline/ Labetalol administered.  Bedside u/s by me confirmed vertex presentation, and pt progressed rapidly to 6-7 cm with strong urge to push. FSE applied to better monitor fhr. The pt then was transferred rapidly to L&D and upon arrival had the precipitous vaginal delivery under nursing support of a term appearing growth restricted female infant, on the stretcher prior to transfer to the L&D bed, and then delivered the placenta spontaneously,  Placental weight 280 gram, with small calcific areas noted. No meconium discoloration appreciated. At 6:07 AM a viable and healthy female was delivered via Vaginal, Spontaneous Delivery (Presentation: Right Occiput Posterior).  APGAR: , ; weight  .   Placenta status: Intact, Spontaneous.  Cord:  with the following complications: .  Cord pH:   Anesthesia: None  Episiotomy: None Lacerations: None Suture Repair: none Est. Blood Loss (mL): 145  Mom to antenatal for Mag..  Baby to NICU due to weight..2 pound 14 oz  Mother again states her desire for giving the baby up for adoption. She wants to sign tubal sterilization papers while here, and desires permanent sterilization. Ronin Crager V 09/19/2015, 6:26 AM Urine DRUG SCREEN still pending. Social work consult.

## 2015-09-19 NOTE — Consult Note (Signed)
Neonatology Note:  Attendance at Code Apgar:   Our team responded to a Code Apgar call to room # 30 following NSVD, due to infant with limited respiratory effort. The requesting physician was Dr. Glo Herring. The mother is a 35yo with no PNC. Reports cocaine use one week ago. ROM occurred 9 hours PTD and the fluid was clear.  At delivery, the baby initially vigorous with good respiratory effort.  Developed shallow breathing. The OB nursing staff in attendance gave vigorous stimulation and a Code Apgar was called. Our team arrived at 6 minutes of life, at which time the baby was active, shallow breathing with HR >100.  Cool to touch; placed on warm and dry blankets.  Sao2 placed and in 60s.  Blow by oxygen given then CPAP with great response. Lungs clear to auscultation.  CPAP and oxygen removed and stable vitals and clinical status.  Ap at 1 and 5 min were by report 5 and 8.   Weight checked and 1360g.  I spoke with the mother in the DR.  She wants baby put up for adoption.  Transferred to NICU due to size and late prematurity.   Jerlyn Ly, MD

## 2015-09-20 DIAGNOSIS — Z87891 Personal history of nicotine dependence: Secondary | ICD-10-CM

## 2015-09-20 DIAGNOSIS — O365930 Maternal care for other known or suspected poor fetal growth, third trimester, not applicable or unspecified: Secondary | ICD-10-CM

## 2015-09-20 DIAGNOSIS — F1410 Cocaine abuse, uncomplicated: Secondary | ICD-10-CM

## 2015-09-20 DIAGNOSIS — O34211 Maternal care for low transverse scar from previous cesarean delivery: Secondary | ICD-10-CM

## 2015-09-20 DIAGNOSIS — Z3A35 35 weeks gestation of pregnancy: Secondary | ICD-10-CM

## 2015-09-20 DIAGNOSIS — O99324 Drug use complicating childbirth: Secondary | ICD-10-CM

## 2015-09-20 DIAGNOSIS — Z590 Homelessness: Secondary | ICD-10-CM

## 2015-09-20 LAB — HEPATITIS B SURFACE ANTIGEN: HEP B S AG: NEGATIVE

## 2015-09-20 MED ORDER — MEDROXYPROGESTERONE ACETATE 150 MG/ML IM SUSP
150.0000 mg | INTRAMUSCULAR | Status: DC
  Administered 2015-09-20: 150 mg via INTRAMUSCULAR
  Filled 2015-09-20: qty 1

## 2015-09-20 MED ORDER — AMLODIPINE BESYLATE 5 MG PO TABS
5.0000 mg | ORAL_TABLET | Freq: Every day | ORAL | Status: DC
  Administered 2015-09-20 – 2015-09-21 (×2): 5 mg via ORAL
  Filled 2015-09-20 (×4): qty 1

## 2015-09-20 MED ORDER — LORATADINE 10 MG PO TABS
10.0000 mg | ORAL_TABLET | Freq: Every day | ORAL | Status: DC
  Administered 2015-09-20 – 2015-09-21 (×2): 10 mg via ORAL
  Filled 2015-09-20 (×4): qty 1

## 2015-09-20 NOTE — Progress Notes (Signed)
Notified Education officer, museum of consult (none here yesterday d/t weather).

## 2015-09-20 NOTE — Progress Notes (Signed)
Post Partum Day 1 VBAC Subjective: reports dry nose and URI. denies headache. BP is improved since delivery.  Objective: Blood pressure 140/83, pulse 92, temperature 97.7 F (36.5 C), temperature source Oral, resp. rate 18, height 4\' 9"  (1.448 m), weight 133 lb 9.6 oz (60.601 kg), SpO2 98 %, unknown if currently breastfeeding.  Physical Exam:  General: alert, cooperative and appears stated age Lochia: appropriate Uterine Fundus: firm DVT Evaluation: No evidence of DVT seen on physical exam.   Recent Labs  09/19/15 0535  HGB 13.8  HCT 40.5    Assessment/Plan: Plan for discharge tomorrow and Contraception DepoTransfer to the floor.Add Norvasc for BP control. Claritin for URI.   LOS: 1 day   Marissa Meyer S 09/20/2015, 7:49 AM

## 2015-09-20 NOTE — H&P (Addendum)
Marissa Meyer is a 35 y.o. female presenting for labor check, no prenatal care, prior cesarean for TOLAC. History OB History    Gravida Para Term Preterm AB TAB SAB Ectopic Multiple Living   5 4 0 4 1   1  0 3     Past Medical History  Diagnosis Date  . Hypertension   . Chlamydia   . Gonorrhea   . Trichomonas   . Migraines     but not diagnosed  . Ectopic pregnancy   . Preterm delivery   . Fibroid   . Pregnancy induced hypertension   . Mental disorder   . Anemia    Past Surgical History  Procedure Laterality Date  . Dilation and curettage of uterus    . Ectopic pregnancy surgery    . Cesarean section N/A 03/21/2014    Procedure: CESAREAN SECTION;  Surgeon: Osborne Oman, MD;  Location: [facility];  Service: Obstetrics;  Laterality: N/A;   Family History: family history includes Cancer in her maternal grandfather; Hypertension in her maternal grandmother and mother. Social History:  reports that she quit smoking about 2 years ago. Her smoking use included Cigarettes. She smoked 0.25 packs per day. She has never used smokeless tobacco. She reports that she drinks alcohol. She reports that she uses illicit drugs (Cocaine).   Prenatal Transfer Tool  No prenatal care, which limits assessment Maternal Diabetes:  Genetic Screening:  Maternal Ultrasounds/Referrals: dating u/s from ED visit at [facility] at 7 wks  Fetal Ultrasounds or other Referrals:   Maternal Substance Abuse:  Yes:  Type: Marijuana, Cocaine Significant Maternal Medications:  None Significant Maternal Lab Results:  Had + UDS cocaine with last pregnancy 2015 Other Comments:  no prenatal care, may be homeless, Baby to be up for adoption, pt desires permanent sterilization  Review of Systems  Constitutional: Negative for fever.  Genitourinary: Negative.  Negative for urgency.   Was 1 cm on admit less than 30 min PTA Dilation: 6 Effacement (%): Thick Station: -3 Exam by:: Dr Glo Herring Alycia Rossetti RN Blood pressure  141/91, pulse 95, temperature 98.4 F (36.9 C), temperature source Oral, resp. rate 18, height 4\' 9"  (1.448 m), weight 133 lb 9.6 oz (60.601 kg), SpO2 98 %, unknown if currently breastfeeding. Exam Physical Exam  Prenatal labs: ABO, Rh: --/--/O POS (01/07 0535) Antibody: NEG (01/07 0535) Rubella:   RPR: Non Reactive (01/07 0535)  HBsAg: Negative (01/07 1608)  HIV: Non Reactive (05/19 1440)  GBS:     Assessment/Plan: Pregnancy 35w6 d, prior c.s for tolac, no prenatal care, suspect drug abuse, may be homeless, Baby to be adopted, IUGR baby suspected Admit asap to L&D, check GBS, anticipate SVD soon   Ocie Stanzione V 09/20/2015, 2:25 PM

## 2015-09-21 MED ORDER — IBUPROFEN 600 MG PO TABS: 600.0000 mg | ORAL_TABLET | Freq: Four times a day (QID) | ORAL | Status: DC

## 2015-09-21 MED ORDER — AMLODIPINE BESYLATE 10 MG PO TABS: 10.0000 mg | ORAL_TABLET | Freq: Every day | ORAL | Status: DC

## 2015-09-21 NOTE — Care Management Note (Signed)
Case Management Note  Patient Details  Name: Marissa Meyer MRN: [id number] Date of Birth: 1981-01-28  Subjective/Objective:                    Action/Plan: Received call from  [place] on unit stating patient could not afford her medication for discharge.  CM went in to see patient and see her in room and patient does not have any money or medicaid.  She called her cousin while CM in room and she stated " that her cousin could pay the $3 copay for her Norvasc from the [employer] program.  CM called around and the cost [facility] medicine [facility] is [facility]$ and at the [facility] it is 9.00$.  Patient states they could not afford that.   CM did the [facility] program through [facility] and her cousin stated they could pay the $3 for her Norvasc.  Sheet and instructions given to patient and patient verbalized understanding and stated [facility] [address] [facility] [address] and pick up her medicine today.  No other needs identified at this time. Patient will follow up at the [facility] clinic for her PP visit.   Expected Discharge Date:                  Expected Discharge Plan:     In-House Referral:     Discharge planning Services     Post Acute Care Choice:    Choice offered to:     DME Arranged:    DME Agency:     HH Arranged:    [place] Agency:     Status of Service:     Medicare Important Message Given:    Date Medicare IM Given:    Medicare IM give by:    Date Additional Medicare IM Given:    Additional Medicare Important Message give by:     If discussed at [place] of Stay Meetings, dates discussed:    Additional Comments:  Yong Channel, RN 09/21/2015, 11:04 AM

## 2015-09-21 NOTE — Discharge Summary (Signed)
OB Discharge Summary  Patient Name: Marissa Meyer DOB: [birth year] MRN: [id number]  Date of admission: 09/19/2015 Delivering MD: Jonnie Kind   Date of discharge: 09/21/2015  Admitting diagnosis: 37months, contractions Intrauterine pregnancy: [redacted]w[redacted]d     Secondary diagnosis:Active Problems:   Gestational hypertension   Chronic hypertension with exacerbation during pregnancy   No prenatal care in current pregnancy in third trimester   Pregnancy with adoption planned, currently in third trimester  Additional problems:drug abuse, hypertension     Discharge diagnosis: Preterm Pregnancy Delivered and CHTN                                                                     Post partum procedures:none  Augmentation: none  Complications: None  Hospital course:  Onset of Labor With Vaginal Delivery     35 y.o. yo [phone] at [redacted]w[redacted]d was admitted in Active Labor on 09/19/2015. Patient had an uncomplicated labor course as follows:  Membrane Rupture Time/Date: 3:00 PM ,09/18/2015   Intrapartum Procedures: Episiotomy: None [1]                                         Lacerations:  None [1]  Patient had a delivery of a Viable infant. 09/19/2015  Information for the patient's newborn:  Marissa Meyer, Marissa Meyer [id number]  Delivery Method: Vaginal, Spontaneous Delivery (Filed from Delivery Summary)    Pateint had an uncomplicated postpartum course.  She is ambulating, tolerating a regular diet, passing flatus, and urinating well. Patient is discharged home in stable condition on 09/21/2015.    Physical exam  Filed Vitals:   09/20/15 1559 09/20/15 2151 09/21/15 0149 09/21/15 0500  BP: 127/76 158/81 128/73 155/78  Pulse: 100 102 98 97  Temp: 98.1 F (36.7 C) 99 F (37.2 C) 99.4 F (37.4 C) 99 F (37.2 C)  TempSrc: Oral Oral Oral Oral  Resp: 17 18 20 18   Height:      Weight:      SpO2: 100% 100% 100% 98%   General: alert, cooperative and no distress Lochia: appropriate Uterine Fundus:  firm Incision: N/A DVT Evaluation: No evidence of DVT seen on physical exam. Negative Homan's sign. No cords or calf tenderness. Labs: Lab Results  Component Value Date   WBC 14.9* 09/19/2015   HGB 13.8 09/19/2015   HCT 40.5 09/19/2015   MCV 91.4 09/19/2015   PLT 156 09/19/2015   CMP Latest Ref Rng 09/19/2015  Glucose 65 - 99 mg/dL 100(H)  BUN 6 - 20 mg/dL 9  Creatinine 0.44 - 1.00 mg/dL 0.67  Sodium 135 - 145 mmol/L 135  Potassium 3.5 - 5.1 mmol/L 4.1  Chloride 101 - 111 mmol/L 105  CO2 22 - 32 mmol/L 24  Calcium 8.9 - 10.3 mg/dL 8.9  Total Protein 6.5 - 8.1 g/dL 6.4(L)  Total Bilirubin 0.3 - 1.2 mg/dL 0.3  Alkaline Phos 38 - 126 U/L 172(H)  AST 15 - 41 U/L 19  ALT 14 - 54 U/L 12(L)    Discharge instruction: per After Visit Summary and "Baby and Me Booklet".  After Visit Meds:    Medication List  Notice    You have not been prescribed any medications.      Diet: routine diet  Activity: Advance as tolerated. Pelvic rest for 6 weeks.   Outpatient follow up:6 weeks Follow up Appt:No future appointments. Follow up visit: No Follow-up on file.  Postpartum contraception: Depo Provera  Newborn Data: Live born female  Birth Weight: 2 lb 14.4 oz (1315 g) APGAR: 5, 8  Baby Feeding: Bottle Disposition:BUFA   09/21/2015 LAWSON, Sarajane Jews, CNM

## 2015-09-21 NOTE — Progress Notes (Signed)
Pt verbalizes understanding of d/c instructions, medications, follow up appts, when to seek medical attention and belongings policy. Pt has no questions at this time, pt is resting in the bed. Pt has no IV to d/c. While reviewing d/c, including new meds, pt states that she doesn't have any insurance. Case Management was made aware of this and is looking into what can be done for the patient concerning medications. Marissa Meyer

## 2015-09-21 NOTE — Plan of Care (Signed)
Problem: Role Relationship: Goal: Ability to demonstrate positive interaction with newborn will improve Outcome: Completed/Met Date Met:  09/21/15 Pt is planning to place baby up for adoption.

## 2015-09-21 NOTE — Plan of Care (Signed)
Problem: Health Behavior/Discharge Planning: Goal: Ability to manage health-related needs will improve Outcome: Progressing Case management and social work consulted and working with patient to help with health management and discharge needs.

## 2015-09-21 NOTE — Progress Notes (Signed)
CSW received call from MOB asking if she can allow her cousin to request baby's medical records.  CSW explained that records are not released until a patient is discharged from the hospital and that only the person who has custody of a baby may request the records at that time.  MOB stated understanding.

## 2015-09-21 NOTE — Progress Notes (Signed)
CPS report was assigned to S. [phone].  CSW met with MOB in her third floor room per her request.  MOB's cousin Jonelle Sidle [phone] was present and states desire to adopt baby.  MOB states this is her plan and gives permission to speak openly with Ms. Elberta Fortis present.  Ms. Elberta Fortis has not yet contacted an attorney, but plans to.  CSW explained that CPS is involved due to baby's positive UDS for Cocaine and may not need to be involved if the hospital is presented with legal adoption paperwork.  CSW states they will definitely be involved until that time.  MOB and Ms. Elberta Fortis stated understanding.  MOB completed visitation form with MOB.  MOB elected to have Ms. Elberta Fortis as the support person.  Visitation explained to Ms. Elberta Fortis.  MOB gave permission for Ms. Elberta Fortis to add two approved visitors to the list.  MOB reports that she does not want contact or involvement with the baby.  CSW explained that since MOB has legal custody at this time, she is the only one who can make medical decisions.  In her absence, two medical staff will consent for treatment.  MOB stated understanding and confirmed that she does not want to be contacted to give consent.

## 2015-09-21 NOTE — Progress Notes (Signed)
Notified Dr. Nehemiah Settle that pt complained of having slight blurred vision in her right eye that has been ongoing since middle of pregnancy. He states this is likely due to pts elevated BPs and she is currently on a treatment plan for that so it should improve. Advised her to notify her physician at her follow up appt in 6 wks if no improvement. Pt has no questions. Pt has met with SW and CM today to have concerns addressed regarding d/c and details for adoption. I ambulated pt to the main entrance where her ride met her and would be driving her home. Marissa Meyer

## 2015-09-23 LAB — TYPE AND SCREEN
ABO/RH(D): O POS
ANTIBODY SCREEN: NEGATIVE
UNIT DIVISION: 0
UNIT DIVISION: 0

## 2015-09-24 ENCOUNTER — Encounter: Payer: Self-pay | Admitting: Obstetrics and Gynecology

## 2015-10-02 ENCOUNTER — Telehealth: Payer: Self-pay | Admitting: *Deleted

## 2015-10-02 NOTE — Telephone Encounter (Signed)
Fayne Mediate, a home nurse, called on behalf of the patient. Stated that she checked patient's BP and it was 144/94. Patient has hx of chronic HTN, delivered on 09/19/15. Takes amlodipine 10mg  daily. Would like someone to call.

## 2015-10-02 NOTE — Telephone Encounter (Signed)
Attempted to reach patient regarding B/P she was not available. Left message to have her call the clinic back concerning her B/P.

## 2015-10-05 NOTE — Telephone Encounter (Signed)
Left message with patient mother who stated patient was not at home. I ask her to tell patient to give [place] a call if she needs any more assistance.

## 2015-10-28 ENCOUNTER — Encounter (HOSPITAL_COMMUNITY): Payer: Self-pay | Admitting: Emergency Medicine

## 2015-10-28 ENCOUNTER — Emergency Department (HOSPITAL_COMMUNITY)
Admission: EM | Admit: 2015-10-28 | Discharge: 2015-10-28 | Disposition: A | Discharge status: Home / Self Care | Payer: Medicaid Other | Attending: Emergency Medicine | Admitting: Emergency Medicine

## 2015-10-28 ENCOUNTER — Emergency Department (HOSPITAL_COMMUNITY): Payer: Medicaid Other

## 2015-10-28 DIAGNOSIS — Z8751 Personal history of pre-term labor: Secondary | ICD-10-CM | POA: Diagnosis not present

## 2015-10-28 DIAGNOSIS — I10 Essential (primary) hypertension: Secondary | ICD-10-CM | POA: Diagnosis not present

## 2015-10-28 DIAGNOSIS — Z8659 Personal history of other mental and behavioral disorders: Secondary | ICD-10-CM | POA: Insufficient documentation

## 2015-10-28 DIAGNOSIS — Z880 Allergy status to penicillin: Secondary | ICD-10-CM | POA: Diagnosis not present

## 2015-10-28 DIAGNOSIS — Z8619 Personal history of other infectious and parasitic diseases: Secondary | ICD-10-CM | POA: Insufficient documentation

## 2015-10-28 DIAGNOSIS — J159 Unspecified bacterial pneumonia: Secondary | ICD-10-CM | POA: Diagnosis not present

## 2015-10-28 DIAGNOSIS — Z862 Personal history of diseases of the blood and blood-forming organs and certain disorders involving the immune mechanism: Secondary | ICD-10-CM | POA: Insufficient documentation

## 2015-10-28 DIAGNOSIS — Z86018 Personal history of other benign neoplasm: Secondary | ICD-10-CM | POA: Insufficient documentation

## 2015-10-28 DIAGNOSIS — Z791 Long term (current) use of non-steroidal anti-inflammatories (NSAID): Secondary | ICD-10-CM | POA: Insufficient documentation

## 2015-10-28 DIAGNOSIS — Z79899 Other long term (current) drug therapy: Secondary | ICD-10-CM | POA: Insufficient documentation

## 2015-10-28 DIAGNOSIS — J189 Pneumonia, unspecified organism: Secondary | ICD-10-CM

## 2015-10-28 DIAGNOSIS — R05 Cough: Secondary | ICD-10-CM | POA: Diagnosis present

## 2015-10-28 DIAGNOSIS — Z87891 Personal history of nicotine dependence: Secondary | ICD-10-CM | POA: Insufficient documentation

## 2015-10-28 DIAGNOSIS — G43909 Migraine, unspecified, not intractable, without status migrainosus: Secondary | ICD-10-CM | POA: Insufficient documentation

## 2015-10-28 MED ORDER — LEVOFLOXACIN 500 MG PO TABS: 500.0000 mg | ORAL_TABLET | Freq: Once | ORAL | Status: DC

## 2015-10-28 MED ORDER — LEVOFLOXACIN 500 MG PO TABS
500.0000 mg | ORAL_TABLET | Freq: Once | ORAL | Status: AC
  Administered 2015-10-28: 500 mg via ORAL
  Filled 2015-10-28: qty 1

## 2015-10-28 NOTE — ED Notes (Signed)
Per pt, states cold symptoms for 3 days-states cough, fever and diarrhea

## 2015-10-28 NOTE — Discharge Instructions (Signed)
As discussed, you have been diagnosed with pneumonia. It is important to take your antibiotics as directed.  Please be sure to follow-up with a primary care physician in one week for repeat evaluation.  Return here for concerning changes in your condition.  In addition to the prescribed antibiotics, Mucinex DM will help with symptom control.

## 2015-10-28 NOTE — ED Provider Notes (Signed)
CSN: [id number]     Arrival date & time 10/28/15  [id number] History   First MD Initiated Contact with Patient 10/28/15 [phone]     Chief Complaint  Patient presents with  . Cough     (Consider location/radiation/quality/duration/timing/severity/associated sxs/prior Treatment) HPI Patient presents with concern of cough, cold, congestion, diarrhea. Symptoms began about 3 days ago. Since onset symptoms of been persistent. No relief with OTC medication. Patient notes that she has multiple sick family members home. No substantial chest pain, no belly pain, no syncope.   Past Medical History  Diagnosis Date  . Hypertension   . Chlamydia   . Gonorrhea   . Trichomonas   . Migraines     but not diagnosed  . Ectopic pregnancy   . Preterm delivery   . Fibroid   . Pregnancy induced hypertension   . Mental disorder   . Anemia    Past Surgical History  Procedure Laterality Date  . Dilation and curettage of uterus    . Ectopic pregnancy surgery    . Cesarean section N/A 03/21/2014    Procedure: CESAREAN SECTION;  Surgeon: Osborne Oman, MD;  Location: [facility];  Service: Obstetrics;  Laterality: N/A;   Family History  Problem Relation Age of Onset  . Hypertension Mother   . Hypertension Maternal Grandmother   . Cancer Maternal Grandfather    Social History  Substance Use Topics  . Smoking status: Former Smoker -- 0.25 packs/day    Types: Cigarettes    Quit date: 08/13/2013  . Smokeless tobacco: Never Used  . Alcohol Use: Yes     Comment: occ   OB History    Gravida Para Term Preterm AB TAB SAB Ectopic Multiple Living   5 4 0 4 1   1  0 3     Review of Systems  Constitutional:       Per HPI, otherwise negative  HENT:       Per HPI, otherwise negative  Respiratory:       Per HPI, otherwise negative  Cardiovascular:       Per HPI, otherwise negative  Gastrointestinal: Negative for vomiting.  Endocrine:       Negative aside from HPI  Genitourinary:       Neg aside from  HPI   Musculoskeletal:       Per HPI, otherwise negative  Skin: Negative.   Neurological: Negative for syncope.      Allergies  Amoxicillin  Home Medications   Prior to Admission medications   Medication Sig Start Date End Date Taking? Authorizing Provider  amLODipine (NORVASC) 10 MG tablet Take 1 tablet (10 mg total) by mouth daily. 09/21/15   Keitha Butte, CNM  ibuprofen (ADVIL,MOTRIN) 600 MG tablet Take 1 tablet (600 mg total) by mouth every 6 (six) hours. 09/21/15   Keitha Butte, CNM  levofloxacin (LEVAQUIN) 500 MG tablet Take 1 tablet (500 mg total) by mouth once. 10/28/15   Carmin Muskrat, MD   BP 176/112 mmHg  Pulse 78  Temp(Src) 99.1 F (37.3 C) (Oral)  Resp 18  SpO2 100%  LMP 10/13/2015 Physical Exam  Constitutional: She is oriented to person, place, and time. She appears well-developed and well-nourished. No distress.  HENT:  Head: Normocephalic and atraumatic.  Eyes: Conjunctivae and EOM are normal.  Cardiovascular: Normal rate and regular rhythm.   Pulmonary/Chest: Effort normal and breath sounds normal. No stridor. No respiratory distress.  Abdominal: She exhibits no distension.  Musculoskeletal: She exhibits no  edema.  Neurological: She is alert and oriented to person, place, and time. No cranial nerve deficit.  Skin: Skin is warm and dry.  Psychiatric: She has a normal mood and affect.  Nursing note and vitals reviewed.   ED Course  Procedures (including critical care time) Labs Review Labs Reviewed - No data to display  Imaging Review Dg Chest 2 View  10/28/2015  CLINICAL DATA:  Shortness of breath for 1 week. EXAM: CHEST  2 VIEW COMPARISON:  March 22, 2014 FINDINGS: There is a small focus of infiltrate in the left base. Lungs elsewhere clear. Heart size and pulmonary vascularity normal. No adenopathy. No bone lesions. IMPRESSION: Small focus of infiltrate consistent with pneumonia, left base. Lungs elsewhere clear. Electronically Signed   By: Lowella Grip III M.D.   On: 10/28/2015 08:35   I have personally reviewed and evaluated these images and lab results as part of my medical decision-making.   Smoking cessation provided, particularly in light of this patient's evaluation in the ED.  I obtained a discount coupon for this patient's meds. MDM   Final diagnoses:  CAP (community acquired pneumonia)   young female presents with 3 days of cough, cold, congestion. Patient is a smoker. Patient is found to have pneumonia. No evidence for bacteremia or sepsis, patient was tolerant of oral medication. Patient discharged in stable condition with primary care follow-up.    Carmin Muskrat, MD 10/28/15 [phone]

## 2015-11-02 ENCOUNTER — Ambulatory Visit: Payer: Medicaid Other | Admitting: Obstetrics and Gynecology

## 2016-05-19 ENCOUNTER — Emergency Department (HOSPITAL_COMMUNITY): Payer: Self-pay

## 2016-05-19 ENCOUNTER — Encounter (HOSPITAL_COMMUNITY): Payer: Self-pay | Admitting: Emergency Medicine

## 2016-05-19 ENCOUNTER — Emergency Department (HOSPITAL_COMMUNITY)
Admission: EM | Admit: 2016-05-19 | Discharge: 2016-05-19 | Discharge status: Against Medical Advice | Payer: Self-pay | Attending: Emergency Medicine | Admitting: Emergency Medicine

## 2016-05-19 DIAGNOSIS — R1011 Right upper quadrant pain: Secondary | ICD-10-CM

## 2016-05-19 DIAGNOSIS — R197 Diarrhea, unspecified: Secondary | ICD-10-CM | POA: Insufficient documentation

## 2016-05-19 DIAGNOSIS — F1490 Cocaine use, unspecified, uncomplicated: Secondary | ICD-10-CM | POA: Insufficient documentation

## 2016-05-19 DIAGNOSIS — R112 Nausea with vomiting, unspecified: Secondary | ICD-10-CM | POA: Insufficient documentation

## 2016-05-19 DIAGNOSIS — I10 Essential (primary) hypertension: Secondary | ICD-10-CM | POA: Insufficient documentation

## 2016-05-19 DIAGNOSIS — Z87891 Personal history of nicotine dependence: Secondary | ICD-10-CM | POA: Insufficient documentation

## 2016-05-19 LAB — COMPREHENSIVE METABOLIC PANEL
ALBUMIN: 4.1 g/dL (ref 3.5–5.0)
ALT: 13 U/L — AB (ref 14–54)
AST: 20 U/L (ref 15–41)
Alkaline Phosphatase: 67 U/L (ref 38–126)
Anion gap: 6 (ref 5–15)
BUN: 14 mg/dL (ref 6–20)
CHLORIDE: 108 mmol/L (ref 101–111)
CO2: 26 mmol/L (ref 22–32)
CREATININE: 0.95 mg/dL (ref 0.44–1.00)
Calcium: 9.1 mg/dL (ref 8.9–10.3)
GFR calc non Af Amer: >60 mL/min (ref >60)
Glucose, Bld: 111 mg/dL — ABNORMAL HIGH (ref 65–99)
Potassium: 3.7 mmol/L (ref 3.5–5.1)
SODIUM: 140 mmol/L (ref 135–145)
Total Bilirubin: 0.5 mg/dL (ref 0.3–1.2)
Total Protein: 8 g/dL (ref 6.5–8.1)

## 2016-05-19 LAB — URINALYSIS, ROUTINE W REFLEX MICROSCOPIC
Bilirubin Urine: NEGATIVE
GLUCOSE, UA: NEGATIVE mg/dL
HGB URINE DIPSTICK: NEGATIVE
Ketones, ur: NEGATIVE mg/dL
Nitrite: NEGATIVE
PROTEIN: NEGATIVE mg/dL
SPECIFIC GRAVITY, URINE: 1.035 — AB (ref 1.005–1.030)
pH: 5.5 (ref 5.0–8.0)

## 2016-05-19 LAB — CBC
HCT: 41.2 % (ref 36.0–46.0)
Hemoglobin: 14.2 g/dL (ref 12.0–15.0)
MCH: 32 pg (ref 26.0–34.0)
MCHC: 34.5 g/dL (ref 30.0–36.0)
MCV: 92.8 fL (ref 78.0–100.0)
PLATELETS: 223 10*3/uL (ref 150–400)
RBC: 4.44 MIL/uL (ref 3.87–5.11)
RDW: 13.2 % (ref 11.5–15.5)
WBC: 14.7 10*3/uL — ABNORMAL HIGH (ref 4.0–10.5)

## 2016-05-19 LAB — URINE MICROSCOPIC-ADD ON: RBC / HPF: NONE SEEN RBC/hpf (ref 0–5)

## 2016-05-19 LAB — I-STAT BETA HCG BLOOD, ED (MC, WL, AP ONLY): I-stat hCG, quantitative: <5 m[IU]/mL (ref <5)

## 2016-05-19 LAB — LIPASE, BLOOD: LIPASE: 92 U/L — AB (ref 11–51)

## 2016-05-19 MED ORDER — ONDANSETRON 4 MG PO TBDP
4.0000 mg | ORAL_TABLET | Freq: Once | ORAL | Status: AC | PRN
  Administered 2016-05-19: 4 mg via ORAL
  Filled 2016-05-19: qty 1

## 2016-05-19 NOTE — ED Notes (Signed)
Pt states that she has to get home to her children and no longer wants to wait to be evaluated; pt states "I will follow back up with my PCP if it continues"; pt signed AMA and left the ER

## 2016-05-19 NOTE — ED Triage Notes (Signed)
Per EMS. Pt woke up with RUQ abd pain. Had emesis and diarrhea for the past 2 hours. Hx of HTN as well, but has not taken medication.

## 2016-05-19 NOTE — ED Notes (Signed)
PT DECLINES IV AT THIS TIME. SHE IS HUNGRY HOWEVER DOES NOT FEEL SHE IS DEHYDRATED.

## 2016-05-19 NOTE — ED Notes (Signed)
Bed: WA17 Expected date:  Expected time:  Means of arrival:  Comments: Triage 

## 2016-05-19 NOTE — ED Notes (Signed)
ED PA at bedside

## 2016-05-20 NOTE — ED Provider Notes (Signed)
[facility] DEPT Provider Note   CSN: [id number] Arrival date & time: 05/19/16  1430     History   Chief Complaint Chief Complaint  Patient presents with  . Abdominal Pain    HPI Marissa Meyer is a 35 y.o. female with a pmhx of ectopic pregnancy, HTN presents to the ED today c/o abd pain, N/V/D onset this morning. Pt states that for the last week she has had intermittent RUQ abdominal pain. Pani not associated with eating. Approximately 2 hours ago pt experienced similar pain and began having vomiting and diarrhea. Pt states she had 4 episodes of NBNB emesis and diarrhea. Pain is described as sharp in nature and does not radiate. Currently pt is pain free in the ED. She denies any fevers, chills, melena or hematochezia, dysuria, vaginal discharge. No sick contacts.   HPI  Past Medical History:  Diagnosis Date  . Anemia   . Chlamydia   . Ectopic pregnancy   . Fibroid   . Gonorrhea   . Hypertension   . Mental disorder   . Migraines    but not diagnosed  . Pregnancy induced hypertension   . Preterm delivery   . Trichomonas     Patient Active Problem List   Diagnosis Date Noted  . Gestational hypertension 09/19/2015  . Chronic hypertension with exacerbation during pregnancy 09/19/2015  . No prenatal care in current pregnancy in third trimester 09/19/2015  . Pregnancy with adoption planned, currently in third trimester 09/19/2015  . 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 Tobacco use in pregnancy, antepartum 02/20/2014    Past Surgical History:  Procedure Laterality Date  . CESAREAN SECTION N/A 03/21/2014   Procedure: CESAREAN SECTION;  Surgeon: Osborne Oman, MD;  Location: [facility];  Service: Obstetrics;  Laterality: N/A;  . DILATION AND CURETTAGE OF UTERUS    . ECTOPIC PREGNANCY SURGERY      OB History    Gravida Para Term Preterm AB Living   5 4 0 4 1 3    SAB TAB Ectopic Multiple Live Births       1 0 3       Home  Medications    Prior to Admission medications   Medication Sig Start Date End Date Taking? Authorizing Provider  amLODipine (NORVASC) 10 MG tablet Take 1 tablet (10 mg total) by mouth daily. Patient not taking: Reported on 05/19/2016 09/21/15   Keitha Butte, CNM  hydrochlorothiazide (HYDRODIURIL) 25 MG tablet Take 25 mg by mouth daily.    Historical Provider, MD  ibuprofen (ADVIL,MOTRIN) 600 MG tablet Take 1 tablet (600 mg total) by mouth every 6 (six) hours. Patient not taking: Reported on 05/19/2016 09/21/15   Keitha Butte, CNM  levofloxacin (LEVAQUIN) 500 MG tablet Take 1 tablet (500 mg total) by mouth once. Patient not taking: Reported on 05/19/2016 10/28/15   Carmin Muskrat, MD    Family History Family History  Problem Relation Age of Onset  . Hypertension Mother   . Hypertension Maternal Grandmother   . Cancer Maternal Grandfather     Social History Social History  Substance Use Topics  . Smoking status: Former Smoker    Packs/day: 0.25    Types: Cigarettes    Quit date: 08/13/2013  . Smokeless tobacco: Never Used  . Alcohol use Yes     Comment: occ     Allergies   Amoxicillin   Review of Systems Review of Systems  All other systems reviewed and are negative.  Physical Exam Updated Vital Signs BP 154/70 (BP Location: Right Arm)   Pulse 82   Temp 98.2 F (36.8 C) (Oral)   Resp 16   SpO2 97%   Physical Exam  Constitutional: She is oriented to person, place, and time. She appears well-developed and well-nourished. No distress.  HENT:  Head: Normocephalic and atraumatic.  Mouth/Throat: No oropharyngeal exudate.  Eyes: Conjunctivae and EOM are normal. Pupils are equal, round, and reactive to light. Right eye exhibits no discharge. Left eye exhibits no discharge. No scleral icterus.  Cardiovascular: Normal rate, regular rhythm, normal heart sounds and intact distal pulses.  Exam reveals no gallop and no friction rub.   No murmur heard. Pulmonary/Chest: Effort  normal and breath sounds normal. No respiratory distress. She has no wheezes. She has no rales. She exhibits no tenderness.  Abdominal: Soft. Bowel sounds are normal. She exhibits no distension. There is tenderness ( RUQ). There is no guarding.  Negative Murphy's sign  Musculoskeletal: Normal range of motion. She exhibits no edema.  Neurological: She is alert and oriented to person, place, and time.  Skin: Skin is warm and dry. No rash noted. She is not diaphoretic. No erythema. No pallor.  Psychiatric: She has a normal mood and affect. Her behavior is normal.  Nursing note and vitals reviewed.    ED Treatments / Results  Labs (all labs ordered are listed, but only abnormal results are displayed) Labs Reviewed  LIPASE, BLOOD - Abnormal; Notable for the following:       Result Value   Lipase 92 (*)    All other components within normal limits  COMPREHENSIVE METABOLIC PANEL - Abnormal; Notable for the following:    Glucose, Bld 111 (*)    ALT 13 (*)    All other components within normal limits  CBC - Abnormal; Notable for the following:    WBC 14.7 (*)    All other components within normal limits  URINALYSIS, ROUTINE W REFLEX MICROSCOPIC (NOT AT [facility]) - Abnormal; Notable for the following:    APPearance TURBID (*)    Specific Gravity, Urine 1.035 (*)    Leukocytes, UA MODERATE (*)    All other components within normal limits  URINE MICROSCOPIC-ADD ON - Abnormal; Notable for the following:    Squamous Epithelial / LPF 6-30 (*)    Bacteria, UA MANY (*)    All other components within normal limits  I-STAT BETA HCG BLOOD, ED ([facility], [facility], [facility] ONLY)    EKG  EKG Interpretation None       Radiology No results found.  Procedures Procedures (including critical care time)  Medications Ordered in ED Medications  ondansetron (ZOFRAN-ODT) disintegrating tablet 4 mg (4 mg Oral Given 05/19/16 1453)     Initial Impression / Assessment and Plan / ED Course  I have reviewed the triage  vital signs and the nursing notes.  Pertinent labs & imaging results that were available during my care of the patient were reviewed by me and considered in my medical decision making (see chart for details).  Clinical Course    35 y.o F presents to the ED with intermittent RUQ abdominal pain for the last week, today pt had 4 episodes of vomting and diarrhea. On presentation to ED pt appears well, in NAD. All VSS. Pt currently pain free. Leukocytosis noted, WBC 14.7. Although pt is currently pain free will obtain RUQ [place] to r.o Gb etiology. Pt declining IV at this time. Urine pending.  While waiting for [place]  and UA, pt left AMA stating that she needed to go pick up her children. Pt remains symptom free. No episodes of emesis while in the ED. She was given STRICT return precautions.   Final Clinical Impressions(s) / ED Diagnoses   Final diagnoses:  RUQ abdominal pain    New Prescriptions Discharge Medication List as of 05/19/2016  7:54 PM       Carlos Levering, PA-C 05/21/16 [place], MD 05/23/16 [phone]

## 2017-02-18 ENCOUNTER — Encounter (HOSPITAL_COMMUNITY): Payer: Self-pay

## 2017-02-18 ENCOUNTER — Emergency Department (HOSPITAL_COMMUNITY)
Admission: EM | Admit: 2017-02-18 | Discharge: 2017-02-18 | Disposition: A | Discharge status: Home / Self Care | Payer: Medicaid Other | Attending: Emergency Medicine | Admitting: Emergency Medicine

## 2017-02-18 DIAGNOSIS — G43909 Migraine, unspecified, not intractable, without status migrainosus: Secondary | ICD-10-CM | POA: Insufficient documentation

## 2017-02-18 DIAGNOSIS — Z79899 Other long term (current) drug therapy: Secondary | ICD-10-CM | POA: Insufficient documentation

## 2017-02-18 DIAGNOSIS — R51 Headache: Secondary | ICD-10-CM

## 2017-02-18 DIAGNOSIS — I10 Essential (primary) hypertension: Secondary | ICD-10-CM | POA: Insufficient documentation

## 2017-02-18 DIAGNOSIS — Z87891 Personal history of nicotine dependence: Secondary | ICD-10-CM | POA: Insufficient documentation

## 2017-02-18 DIAGNOSIS — R519 Headache, unspecified: Secondary | ICD-10-CM

## 2017-02-18 MED ORDER — AMLODIPINE BESYLATE 5 MG PO TABS: 5.0000 mg | ORAL_TABLET | Freq: Every day | ORAL | 0 refills | Status: DC

## 2017-02-18 NOTE — ED Triage Notes (Signed)
She c/o typical migraine (right peri-orbital area) x 2 days.

## 2017-02-18 NOTE — ED Notes (Signed)
Bed: [facility]PT2 Expected date:  Expected time:  Means of arrival:  Comments: 

## 2017-02-18 NOTE — ED Provider Notes (Signed)
[facility] DEPT Provider Note   CSN: [id number] Arrival date & time: 02/18/17  1708     History   Chief Complaint Chief Complaint  Patient presents with  . Migraine    HPI Marissa Meyer is a 36 y.o. female.  The history is provided by the patient. No language interpreter was used.  Migraine     Marissa Meyer is a 36 y.o. female who presents to the Emergency Department complaining of HA.  This morning she developed a right periorbital headache with a throbbing sensation that she describes as typical for her migraines. She had mild sedation blurred vision. She took a tramadol and Motrin before she presented to the ED and now her headache is resolved. No numbness, weakness, fevers, vomiting, diarrhea. She states that she has been out of her blood pressure medicines for the last month and would like these refilled. She declines any headache medications.  Past Medical History:  Diagnosis Date  . Anemia   . Chlamydia   . Ectopic pregnancy   . Fibroid   . Gonorrhea   . Hypertension   . Mental disorder   . Migraines    but not diagnosed  . Pregnancy induced hypertension   . Preterm delivery   . Trichomonas     Patient Active Problem List   Diagnosis Date Noted  . Gestational hypertension 09/19/2015  . Chronic hypertension with exacerbation during pregnancy 09/19/2015  . No prenatal care in current pregnancy in third trimester 09/19/2015  . Pregnancy with adoption planned, currently in third trimester 09/19/2015  . 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 Tobacco use in pregnancy, antepartum 02/20/2014    Past Surgical History:  Procedure Laterality Date  . CESAREAN SECTION N/A 03/21/2014   Procedure: CESAREAN SECTION;  Surgeon: Osborne Oman, MD;  Location: [facility];  Service: Obstetrics;  Laterality: N/A;  . DILATION AND CURETTAGE OF UTERUS    . ECTOPIC PREGNANCY SURGERY      OB History    Gravida Para Term Preterm AB Living   5 4 0  4 1 3    SAB TAB Ectopic Multiple Live Births       1 0 3       Home Medications    Prior to Admission medications   Medication Sig Start Date End Date Taking? Authorizing Provider  hydrochlorothiazide (HYDRODIURIL) 25 MG tablet Take 25 mg by mouth daily.   Yes [provider]  amLODipine (NORVASC) 5 MG tablet Take 1 tablet (5 mg total) by mouth daily. 02/18/17   Quintella Reichert, MD  ibuprofen (ADVIL,MOTRIN) 600 MG tablet Take 1 tablet (600 mg total) by mouth every 6 (six) hours. Patient not taking: Reported on 05/19/2016 09/21/15   Keitha Butte, CNM  levofloxacin (LEVAQUIN) 500 MG tablet Take 1 tablet (500 mg total) by mouth once. Patient not taking: Reported on 05/19/2016 10/28/15   Carmin Muskrat, MD    Family History Family History  Problem Relation Age of Onset  . Hypertension Mother   . Hypertension Maternal Grandmother   . Cancer Maternal Grandfather     Social History Social History  Substance Use Topics  . Smoking status: Former Smoker    Packs/day: 0.25    Types: Cigarettes    Quit date: 08/13/2013  . Smokeless tobacco: Never Used  . Alcohol use Yes     Comment: occ     Allergies   Amoxicillin   Review of Systems Review of Systems  All  other systems reviewed and are negative.    Physical Exam Updated Vital Signs BP (!) 178/103 (BP Location: Right Arm)   Pulse 78   Temp 99.5 F (37.5 C) (Oral)   Resp 16   Ht 4\' 9"  (1.448 m)   Wt 43.3 kg (95 lb 8 oz)   LMP 01/21/2017 (Exact Date)   SpO2 100%   BMI 20.67 kg/m   Physical Exam  Constitutional: She is oriented to person, place, and time. She appears well-developed and well-nourished.  HENT:  Head: Normocephalic and atraumatic.  Eyes: EOM are normal. Pupils are equal, round, and reactive to light.  Neck: Neck supple.  Cardiovascular: Normal rate and regular rhythm.   No murmur heard. Pulmonary/Chest: Effort normal and breath sounds normal. No respiratory distress.  Abdominal: Soft. There  is no tenderness. There is no rebound and no guarding.  Musculoskeletal: She exhibits no edema or tenderness.  Neurological: She is alert and oriented to person, place, and time. No cranial nerve deficit.  5 out of 5 strength in all 4 extremities with sensation light touch intact in all 4 extremities  Skin: Skin is warm and dry.  Psychiatric: She has a normal mood and affect. Her behavior is normal.  Nursing note and vitals reviewed.    ED Treatments / Results  Labs (all labs ordered are listed, but only abnormal results are displayed) Labs Reviewed - No data to display  EKG  EKG Interpretation None       Radiology No results found.  Procedures Procedures (including critical care time)  Medications Ordered in ED Medications - No data to display   Initial Impression / Assessment and Plan / ED Course  I have reviewed the triage vital signs and the nursing notes.  Pertinent labs & imaging results that were available during my care of the patient were reviewed by me and considered in my medical decision making (see chart for details).     Patient here for evaluation migraine headache, her headache resolved prior to ED arrival and evaluation. She is requesting refill on her blood pressure medications. She is asymptomatic in the ED. Will restart on Norvasc. Counseled patient on importance of outpatient follow-up and return precautions. Current clinical picture is not consistent with hypertensive urgency, subarachnoid hemorrhage, meningitis.  Final Clinical Impressions(s) / ED Diagnoses   Final diagnoses:  Bad headache  Essential hypertension    New Prescriptions Discharge Medication List as of 02/18/2017  8:22 PM       Quintella Reichert, MD 02/18/17 2300

## 2017-06-30 ENCOUNTER — Emergency Department (HOSPITAL_COMMUNITY)
Admission: EM | Admit: 2017-06-30 | Discharge: 2017-06-30 | Disposition: A | Discharge status: Home / Self Care | Payer: Self-pay | Attending: Emergency Medicine | Admitting: Emergency Medicine

## 2017-06-30 ENCOUNTER — Emergency Department (HOSPITAL_COMMUNITY): Payer: Self-pay

## 2017-06-30 ENCOUNTER — Encounter (HOSPITAL_COMMUNITY): Payer: Self-pay | Admitting: Emergency Medicine

## 2017-06-30 DIAGNOSIS — Z79899 Other long term (current) drug therapy: Secondary | ICD-10-CM | POA: Insufficient documentation

## 2017-06-30 DIAGNOSIS — X58XXXA Exposure to other specified factors, initial encounter: Secondary | ICD-10-CM | POA: Insufficient documentation

## 2017-06-30 DIAGNOSIS — T148XXA Other injury of unspecified body region, initial encounter: Secondary | ICD-10-CM

## 2017-06-30 DIAGNOSIS — J00 Acute nasopharyngitis [common cold]: Secondary | ICD-10-CM | POA: Insufficient documentation

## 2017-06-30 DIAGNOSIS — Z87891 Personal history of nicotine dependence: Secondary | ICD-10-CM | POA: Insufficient documentation

## 2017-06-30 DIAGNOSIS — Y999 Unspecified external cause status: Secondary | ICD-10-CM | POA: Insufficient documentation

## 2017-06-30 DIAGNOSIS — S29012A Strain of muscle and tendon of back wall of thorax, initial encounter: Secondary | ICD-10-CM | POA: Insufficient documentation

## 2017-06-30 DIAGNOSIS — Y929 Unspecified place or not applicable: Secondary | ICD-10-CM | POA: Insufficient documentation

## 2017-06-30 DIAGNOSIS — I10 Essential (primary) hypertension: Secondary | ICD-10-CM | POA: Insufficient documentation

## 2017-06-30 DIAGNOSIS — Y939 Activity, unspecified: Secondary | ICD-10-CM | POA: Insufficient documentation

## 2017-06-30 NOTE — ED Provider Notes (Signed)
[facility] DEPT Provider Note  CSN: [id number] Arrival date & time: 06/30/17 1327  Chief Complaint(s) Cough  HPI Marissa Meyer is a 36 y.o. female   The history is provided by the patient.  URI   This is a new problem. Episode onset: 1 week. The problem has not changed since onset.There has been no fever. Associated symptoms include congestion and rhinorrhea. Pertinent negatives include no chest pain, no nausea, no vomiting and no headaches. She has tried nothing for the symptoms.    Also with 1 week of left upper back pain noted with coughing and ROM of the left shoulder.   Past Medical History Past Medical History:  Diagnosis Date  . Anemia   . Chlamydia   . Ectopic pregnancy   . Fibroid   . Gonorrhea   . Hypertension   . Mental disorder   . Migraines    but not diagnosed  . Pregnancy induced hypertension   . Preterm delivery   . Trichomonas    Patient Active Problem List   Diagnosis Date Noted  . Gestational hypertension 09/19/2015  . Chronic hypertension with exacerbation during pregnancy 09/19/2015  . No prenatal care in current pregnancy in third trimester 09/19/2015  . Pregnancy with adoption planned, currently in third trimester 09/19/2015  . Cocaine abuse affecting pregnancy in third trimester ([place]) 03/14/2014  . Tobacco use in pregnancy, antepartum 02/20/2014   Home Medication(s) Prior to Admission medications   Medication Sig Start Date End Date Taking? Authorizing Provider  amLODipine (NORVASC) 5 MG tablet Take 1 tablet (5 mg total) by mouth daily. 02/18/17   Quintella Reichert, MD  hydrochlorothiazide (HYDRODIURIL) 25 MG tablet Take 25 mg by mouth daily.    [provider]  ibuprofen (ADVIL,MOTRIN) 600 MG tablet Take 1 tablet (600 mg total) by mouth every 6 (six) hours. Patient not taking: Reported on 05/19/2016 09/21/15   Keitha Butte, CNM  levofloxacin (LEVAQUIN) 500 MG tablet Take 1 tablet (500 mg total) by mouth  once. Patient not taking: Reported on 05/19/2016 10/28/15   Carmin Muskrat, MD                                                                                                                                    Past Surgical History Past Surgical History:  Procedure Laterality Date  . CESAREAN SECTION N/A 03/21/2014   Procedure: CESAREAN SECTION;  Surgeon: Osborne Oman, MD;  Location: [facility];  Service: Obstetrics;  Laterality: N/A;  . DILATION AND CURETTAGE OF UTERUS    . ECTOPIC PREGNANCY SURGERY     Family History Family History  Problem Relation Age of Onset  . Hypertension Mother   . Hypertension Maternal Grandmother   . Cancer Maternal Grandfather     Social History Social History  Substance Use Topics  . Smoking status: Former Smoker    Packs/day: 0.25    Types: Cigarettes  Quit date: 08/13/2013  . Smokeless tobacco: Never Used  . Alcohol use Yes     Comment: occ   Allergies Amoxicillin  Review of Systems Review of Systems  HENT: Positive for congestion and rhinorrhea.   Cardiovascular: Negative for chest pain.  Gastrointestinal: Negative for nausea and vomiting.  Neurological: Negative for headaches.   All other systems are reviewed and are negative for acute change except as noted in the HPI  Physical Exam Vital Signs  I have reviewed the triage vital signs BP 127/82 (BP Location: Right Arm)   Pulse 85   Temp 98.2 F (36.8 C) (Oral)   Resp 16   LMP 06/19/2017   SpO2 98%   Physical Exam  Constitutional: She is oriented to person, place, and time. She appears well-developed and well-nourished. No distress.  HENT:  Head: Normocephalic and atraumatic.  Nose: Mucosal edema and rhinorrhea present.  Nasal septal fistula  Eyes: Pupils are equal, round, and reactive to light. Conjunctivae and EOM are normal. Right eye exhibits no discharge. Left eye exhibits no discharge. No scleral icterus.  Neck: Normal range of motion. Neck supple.  Cardiovascular:  Normal rate and regular rhythm.  Exam reveals no gallop and no friction rub.   No murmur heard. Pulmonary/Chest: Effort normal and breath sounds normal. No stridor. No respiratory distress. She has no rales.  Abdominal: Soft. She exhibits no distension. There is no tenderness.  Musculoskeletal: She exhibits no edema or tenderness.  Neurological: She is alert and oriented to person, place, and time.  Skin: Skin is warm and dry. No rash noted. She is not diaphoretic. No erythema.  Psychiatric: She has a normal mood and affect.  Vitals reviewed.   ED Results and Treatments Labs (all labs ordered are listed, but only abnormal results are displayed) Labs Reviewed - No data to display                                                                                                                       EKG  EKG Interpretation  Date/Time:    Ventricular Rate:    PR Interval:    QRS Duration:   QT Interval:    QTC Calculation:   R Axis:     Text Interpretation:        Radiology Dg Chest 2 View  Result Date: 06/30/2017 CLINICAL DATA:  Cough and shortness of breath for 2 weeks. EXAM: CHEST  2 VIEW COMPARISON:  PA and lateral chest 10/28/2015, 03/22/2014 and 09/14/2012. FINDINGS: The lungs are clear. Heart size is normal. No pneumothorax or pleural effusion. No acute bony abnormality. IMPRESSION: No acute disease. Electronically Signed   By: Inge Rise M.D.   On: 06/30/2017 15:25   Pertinent labs & imaging results that were available during my care of the patient were reviewed by me and considered in my medical decision making (see chart for details).  Medications Ordered in ED Medications - No data to display  Procedures Procedures  (including critical care time)  Medical Decision Making / ED Course I have reviewed the nursing notes for this encounter  and the patient's prior records (if available in EHR or on provided paperwork).    36 y.o. female presents with cough, rhinorrhea, and nasal congestion for 7 days. adequate oral hydration. Rest of history as above.  Patient appears well. No signs of toxicity, patient is interactive and playful. No hypoxia, tachypnea or other signs of respiratory distress. No sign of clinical dehydration. Lung exam clear. Rest of exam as above.  CXR negative.  Most consistent with viral upper respiratory infection.   No evidence suggestive of pharyngitis, AOM, PNA.   Upper back pain due to muscle strain. Low suspicion for cardiopulmonary etiology.   Discussed symptomatic treatment with the patient and they will follow closely with their PCP.     Final Clinical Impression(s) / ED Diagnoses Final diagnoses:  Acute nasopharyngitis  Muscle strain    Disposition: Discharge  Condition: Good  I have discussed the results, Dx and Tx plan with the patient who expressed understanding and agree(s) with the plan. Discharge instructions discussed at great length. The patient was given strict return precautions who verbalized understanding of the instructions. No further questions at time of discharge.    New Prescriptions   No medications on file    Follow Up: Primary care provider        This chart was dictated using voice recognition software.  Despite best efforts to proofread,  errors can occur which can change the documentation meaning.   Fatima Blank, MD 06/30/17 1540

## 2017-06-30 NOTE — Discharge Instructions (Signed)
You may take over-the-counter medicine for symptomatic relief, such as Tylenol, Motrin, TheraFlu, Alka seltzer , black elderberry, etc. Please limit acetaminophen (Tylenol) to 4000 mg and Ibuprofen (Motrin, Advil, etc.) to 2400 mg for a 24hr period. Please note that other over-the-counter medicine may contain acetaminophen or ibuprofen as a component of their ingredients.   You may use over-the-counter Motrin (Ibuprofen), Acetaminophen (Tylenol), topical muscle creams such as SalonPas, [employer], Bengay, etc. Please stretch, apply heat, and have massage therapy for additional assistance.

## 2017-06-30 NOTE — ED Triage Notes (Signed)
Per pt, states cough and congestion for 2 weeks-states cough causing left neck and shoulder pain

## 2017-07-22 ENCOUNTER — Other Ambulatory Visit: Payer: Self-pay

## 2017-07-22 ENCOUNTER — Emergency Department (HOSPITAL_COMMUNITY)
Admission: EM | Admit: 2017-07-22 | Discharge: 2017-07-22 | Disposition: A | Discharge status: Home / Self Care | Payer: Self-pay | Attending: Emergency Medicine | Admitting: Emergency Medicine

## 2017-07-22 ENCOUNTER — Encounter (HOSPITAL_COMMUNITY): Payer: Self-pay

## 2017-07-22 DIAGNOSIS — I10 Essential (primary) hypertension: Secondary | ICD-10-CM | POA: Insufficient documentation

## 2017-07-22 DIAGNOSIS — R110 Nausea: Secondary | ICD-10-CM | POA: Insufficient documentation

## 2017-07-22 DIAGNOSIS — Z79899 Other long term (current) drug therapy: Secondary | ICD-10-CM | POA: Insufficient documentation

## 2017-07-22 DIAGNOSIS — F1410 Cocaine abuse, uncomplicated: Secondary | ICD-10-CM | POA: Insufficient documentation

## 2017-07-22 DIAGNOSIS — Z87891 Personal history of nicotine dependence: Secondary | ICD-10-CM | POA: Insufficient documentation

## 2017-07-22 DIAGNOSIS — L02811 Cutaneous abscess of head [any part, except face]: Secondary | ICD-10-CM | POA: Insufficient documentation

## 2017-07-22 MED ORDER — FLUCONAZOLE 150 MG PO TABS: 150.0000 mg | ORAL_TABLET | Freq: Once | ORAL | 0 refills | Status: AC

## 2017-07-22 MED ORDER — CEPHALEXIN 500 MG PO CAPS: ORAL_CAPSULE | ORAL | 0 refills | Status: DC

## 2017-07-22 NOTE — ED Provider Notes (Signed)
[facility] DEPT Provider Note   CSN: [id number] Arrival date & time: 07/22/17  1324     History   Chief Complaint Chief Complaint  Patient presents with  . Abscess    HPI Marissa Meyer is a 36 y.o. female with a PMHx of anemia, HTN, and other conditions listed below, who presents to the ED with complaints of abscess to the right posterior scalp x 6 days.  Patient states that she had a scratch on her scalp which she scratched with a home, and then subsequently she developed swelling, erythema, and pain to this area.  Over the last few days there has been purulent drainage coming from it.  She describes the pain as 8/10 intermittent throbbing nonradiating right posterior scalp pain, worse with palpation to the area, and mildly improved with ibuprofen.  She reports associated nausea.  She denies any warmth, fevers, chills, CP, SOB, abd pain, V/D/C, hematuria, dysuria, myalgias, arthralgias, numbness, tingling, focal weakness, or any other complaints at this time. She has no immunocompromising conditions. No hx of abscess before. No current PCP provider due to lack of insurance. She is allergic to amoxicillin, states it caused a rash and a yeast infection; no anaphylaxis type reaction with it.    The history is provided by the patient and medical records. No language interpreter was used.  Abscess  Location:  Head/neck Head/neck abscess location:  Scalp Abscess quality: draining, painful and redness   Abscess quality: no warmth   Duration:  6 days Progression:  Worsening Pain details:    Quality:  Throbbing   Severity:  Moderate   Duration:  6 days   Timing:  Intermittent   Progression:  Worsening Chronicity:  New Context: skin injury   Context: not diabetes and not immunosuppression   Relieved by:  NSAIDs Worsened by:  Draining/squeezing Ineffective treatments:  None tried Associated symptoms: nausea   Associated symptoms: no fever and no  vomiting   Risk factors: no prior abscess     Past Medical History:  Diagnosis Date  . Anemia   . Chlamydia   . Ectopic pregnancy   . Fibroid   . Gonorrhea   . Hypertension   . Mental disorder   . Migraines    but not diagnosed  . Pregnancy induced hypertension   . Preterm delivery   . Trichomonas     Patient Active Problem List   Diagnosis Date Noted  . Gestational hypertension 09/19/2015  . Chronic hypertension with exacerbation during pregnancy 09/19/2015  . No prenatal care in current pregnancy in third trimester 09/19/2015  . Pregnancy with adoption planned, currently in third trimester 09/19/2015  . Cocaine abuse affecting pregnancy in third trimester ([place]) 03/14/2014  . Tobacco use in pregnancy, antepartum 02/20/2014    Past Surgical History:  Procedure Laterality Date  . DILATION AND CURETTAGE OF UTERUS    . ECTOPIC PREGNANCY SURGERY      OB History    Gravida Para Term Preterm AB Living   5 4 0 4 1 3    SAB TAB Ectopic Multiple Live Births       1 0 3       Home Medications    Prior to Admission medications   Medication Sig Start Date End Date Taking? Authorizing Provider  amLODipine (NORVASC) 5 MG tablet Take 1 tablet (5 mg total) by mouth daily. 02/18/17   Quintella Reichert, MD  hydrochlorothiazide (HYDRODIURIL) 25 MG tablet Take 25 mg by mouth daily.  [provider]  ibuprofen (ADVIL,MOTRIN) 600 MG tablet Take 1 tablet (600 mg total) by mouth every 6 (six) hours. Patient not taking: Reported on 05/19/2016 09/21/15   Keitha Butte, CNM  levofloxacin (LEVAQUIN) 500 MG tablet Take 1 tablet (500 mg total) by mouth once. Patient not taking: Reported on 05/19/2016 10/28/15   Carmin Muskrat, MD    Family History Family History  Problem Relation Age of Onset  . Hypertension Mother   . Hypertension Maternal Grandmother   . Cancer Maternal Grandfather     Social History Social History   Tobacco Use  . Smoking status: Former Smoker     Packs/day: 0.25    Types: Cigarettes    Last attempt to quit: 08/13/2013    Years since quitting: 3.9  . Smokeless tobacco: Never Used  Substance Use Topics  . Alcohol use: Yes    Comment: occ  . Drug use: Yes    Types: Cocaine     Allergies   Amoxicillin   Review of Systems Review of Systems  Constitutional: Negative for chills and fever.  Respiratory: Negative for shortness of breath.   Cardiovascular: Negative for chest pain.  Gastrointestinal: Positive for nausea. Negative for abdominal pain, constipation, diarrhea and vomiting.  Genitourinary: Negative for dysuria and hematuria.  Musculoskeletal: Negative for arthralgias and myalgias.  Skin: Positive for color change and wound (abscess).  Allergic/Immunologic: Negative for immunocompromised state.  Neurological: Negative for weakness and numbness.  Psychiatric/Behavioral: Negative for confusion.   All other systems reviewed and are negative for acute change except as noted in the HPI.    Physical Exam Updated Vital Signs BP (!) 171/110 (BP Location: Right Arm)   Pulse 89   Temp 99.7 F (37.6 C) (Oral)   Resp 16   Ht 4\' 9"  (1.448 m)   Wt 44.5 kg (98 lb)   LMP 07/13/2017   SpO2 100%   BMI 21.21 kg/m   Physical Exam  Constitutional: She is oriented to person, place, and time. Vital signs are normal. She appears well-developed and well-nourished.  Non-toxic appearance. No distress.  Afebrile, nontoxic, NAD, HTN noted similar to prior visits  HENT:  Head: Normocephalic and atraumatic.    Mouth/Throat: Mucous membranes are normal.  Small ~1cm round abscess vs sebaceous cyst with slightly fluctuance to the center, with a hole in the middle where purulent material can be easily expressed with minimal pressure; mildly TTP; mildly erythematous; no warmth. Slight induration around the area, no spreading cellulitis.   Eyes: Conjunctivae and EOM are normal. Right eye exhibits no discharge. Left eye exhibits no discharge.   Neck: Normal range of motion. Neck supple.  Cardiovascular: Normal rate and intact distal pulses.  Pulmonary/Chest: Effort normal. No respiratory distress.  Abdominal: Normal appearance. She exhibits no distension.  Musculoskeletal: Normal range of motion.  Neurological: She is alert and oriented to person, place, and time. She has normal strength. No sensory deficit.  Skin: Skin is warm, dry and intact. No rash noted.  See HENT exam  Psychiatric: She has a normal mood and affect. Her behavior is normal.  Nursing note and vitals reviewed.    ED Treatments / Results  Labs (all labs ordered are listed, but only abnormal results are displayed) Labs Reviewed - No data to display  EKG  EKG Interpretation None       Radiology No results found.  Procedures Procedures (including critical care time)  Medications Ordered in ED Medications - No data to display  Initial Impression / Assessment and Plan / ED Course  I have reviewed the triage vital signs and the nursing notes.  Pertinent labs & imaging results that were available during my care of the patient were reviewed by me and considered in my medical decision making (see chart for details).     36 y.o. female here with R posterior scalp abscess x6 days, already draining. States she scratched her scalp with a comb and then the abscess developed. On exam, abscess vs sebaceous cyst with slight central fluctuance which easily expresses purulent drainage from the center where there is a hole; mildly erythematous, no warmth, mildly TTP; doubt need for I&D since it's actively draining with minimal pressure to it. Advised warm compresses, tylenol/motrin for pain, and will rx abx (pt requesting cheapest one; states amoxicillin caused yeast infection but no other anaphylactic reaction to it; will rx diflucan with keflex in case she develops yeast infection). Advised f/up with [facility] in 1wk for recheck and to establish care. I explained the  diagnosis and have given explicit precautions to return to the ER including for any other new or worsening symptoms. The patient understands and accepts the medical plan as it's been dictated and I have answered their questions. Discharge instructions concerning home care and prescriptions have been given. The patient is STABLE and is discharged to home in good condition.    Final Clinical Impressions(s) / ED Diagnoses   Final diagnoses:  Abscess, scalp    ED Discharge Orders        Ordered    cephALEXin (KEFLEX) 500 MG capsule     07/22/17 1515    fluconazole (DIFLUCAN) 150 MG tablet   Once     07/22/17 [address] [place], [place] 07/22/17 1523    Charlesetta Shanks, MD 07/22/17 1528

## 2017-07-22 NOTE — Discharge Instructions (Signed)
Keep area clean and dry. Apply warm compresses to affected area throughout the day, no more than 20 minutes per hour at a time. Take antibiotic until it is finished. If you develop a yeast infection, then use the diflucan medication as directed AFTER FINISHING the antibiotics. Alternate between tylenol and motrin as needed for pain.  [facility]-up with [facility] in 1 week for wound recheck and to establish medical care. Monitor area for signs of infection to include, but not limited to: increasing pain, spreading redness, drainage/pus, worsening swelling, or fevers. Return to emergency department for emergent changing or worsening symptoms.

## 2017-09-02 ENCOUNTER — Emergency Department (HOSPITAL_COMMUNITY)
Admission: EM | Admit: 2017-09-02 | Discharge: 2017-09-02 | Disposition: A | Discharge status: Home / Self Care | Payer: Self-pay | Attending: Emergency Medicine | Admitting: Emergency Medicine

## 2017-09-02 ENCOUNTER — Encounter (HOSPITAL_COMMUNITY): Payer: Self-pay | Admitting: Emergency Medicine

## 2017-09-02 DIAGNOSIS — R519 Headache, unspecified: Secondary | ICD-10-CM

## 2017-09-02 DIAGNOSIS — Z87891 Personal history of nicotine dependence: Secondary | ICD-10-CM | POA: Insufficient documentation

## 2017-09-02 DIAGNOSIS — I10 Essential (primary) hypertension: Secondary | ICD-10-CM | POA: Insufficient documentation

## 2017-09-02 DIAGNOSIS — R51 Headache: Secondary | ICD-10-CM | POA: Insufficient documentation

## 2017-09-02 DIAGNOSIS — H53149 Visual discomfort, unspecified: Secondary | ICD-10-CM | POA: Insufficient documentation

## 2017-09-02 DIAGNOSIS — F1410 Cocaine abuse, uncomplicated: Secondary | ICD-10-CM | POA: Insufficient documentation

## 2017-09-02 DIAGNOSIS — Z79899 Other long term (current) drug therapy: Secondary | ICD-10-CM | POA: Insufficient documentation

## 2017-09-02 MED ORDER — METOCLOPRAMIDE HCL 5 MG/ML IJ SOLN
10.0000 mg | Freq: Once | INTRAMUSCULAR | Status: AC
  Administered 2017-09-02: 10 mg via INTRAVENOUS
  Filled 2017-09-02: qty 2

## 2017-09-02 MED ORDER — SODIUM CHLORIDE 0.9 % IV BOLUS (SEPSIS)
1000.0000 mL | Freq: Once | INTRAVENOUS | Status: AC
  Administered 2017-09-02: 1000 mL via INTRAVENOUS

## 2017-09-02 MED ORDER — DEXAMETHASONE SODIUM PHOSPHATE 10 MG/ML IJ SOLN
10.0000 mg | Freq: Once | INTRAMUSCULAR | Status: AC
  Administered 2017-09-02: 10 mg via INTRAVENOUS
  Filled 2017-09-02: qty 1

## 2017-09-02 MED ORDER — KETOROLAC TROMETHAMINE 15 MG/ML IJ SOLN
15.0000 mg | Freq: Once | INTRAMUSCULAR | Status: AC
  Administered 2017-09-02: 15 mg via INTRAVENOUS
  Filled 2017-09-02: qty 1

## 2017-09-02 MED ORDER — DIPHENHYDRAMINE HCL 50 MG/ML IJ SOLN
12.5000 mg | Freq: Once | INTRAMUSCULAR | Status: AC
  Administered 2017-09-02: 12.5 mg via INTRAVENOUS
  Filled 2017-09-02: qty 1

## 2017-09-02 NOTE — ED Notes (Signed)
Pt stated that she does not see any medical professional for her blood pressure.

## 2017-09-02 NOTE — ED Triage Notes (Signed)
Patient c/o migraine x2 hours. Hx of same. Reports nausea and light sensitivity.

## 2017-09-02 NOTE — ED Notes (Signed)
Warm blanket given

## 2017-09-02 NOTE — ED Notes (Signed)
Pt unable to sign due to E-Signature pad not working. 

## 2017-09-02 NOTE — ED Provider Notes (Signed)
[facility] DEPT Provider Note   CSN: [id number] Arrival date & time: 09/02/17  1313     History   Chief Complaint Chief Complaint  Patient presents with  . Headache    HPI JHANIA ETHERINGTON is a 36 y.o. female who presents with a headache. PMH significant for frequent headaches. She states that she has frequent "migraines" but has not been formally diagnosed. She gets them about 3 times a week but this one is just more severe in nature. It is over the right side of her head and shoots over to the left. She has associated photophobia, N/V. Its feels like prior headaches. She denies fever, LOC, trauma, neck stiffness, acute onset, worst headache of life, different from previous HA. She is requesting a migraine cocktail.    HPI  Past Medical History:  Diagnosis Date  . Anemia   . Chlamydia   . Ectopic pregnancy   . Fibroid   . Gonorrhea   . Hypertension   . Mental disorder   . Migraines    but not diagnosed  . Pregnancy induced hypertension   . Preterm delivery   . Trichomonas     Patient Active Problem List   Diagnosis Date Noted  . Gestational hypertension 09/19/2015  . Chronic hypertension with exacerbation during pregnancy 09/19/2015  . No prenatal care in current pregnancy in third trimester 09/19/2015  . Pregnancy with adoption planned, currently in third trimester 09/19/2015  . Cocaine abuse affecting pregnancy in third trimester ([place]) 03/14/2014  . Tobacco use in pregnancy, antepartum 02/20/2014    Past Surgical History:  Procedure Laterality Date  . CESAREAN SECTION N/A 03/21/2014   Procedure: CESAREAN SECTION;  Surgeon: Osborne Oman, MD;  Location: [facility];  Service: Obstetrics;  Laterality: N/A;  . DILATION AND CURETTAGE OF UTERUS    . ECTOPIC PREGNANCY SURGERY      OB History    Gravida Para Term Preterm AB Living   5 4 0 4 1 3    SAB TAB Ectopic Multiple Live Births       1 0 3       Home Medications    Prior  to Admission medications   Medication Sig Start Date End Date Taking? Authorizing Provider  amLODipine (NORVASC) 5 MG tablet Take 1 tablet (5 mg total) by mouth daily. 02/18/17   Quintella Reichert, MD  cephALEXin [facility]) 500 MG capsule 2 caps po bid x 7 days 07/22/17   Street, [place], PA-C  hydrochlorothiazide (HYDRODIURIL) 25 MG tablet Take 25 mg by mouth daily.    [provider]  ibuprofen (ADVIL,MOTRIN) 600 MG tablet Take 1 tablet (600 mg total) by mouth every 6 (six) hours. Patient not taking: Reported on 05/19/2016 09/21/15   Keitha Butte, CNM  levofloxacin (LEVAQUIN) 500 MG tablet Take 1 tablet (500 mg total) by mouth once. Patient not taking: Reported on 05/19/2016 10/28/15   Carmin Muskrat, MD    Family History Family History  Problem Relation Age of Onset  . Hypertension Mother   . Hypertension Maternal Grandmother   . Cancer Maternal Grandfather     Social History Social History   Tobacco Use  . Smoking status: Former Smoker    Packs/day: 0.25    Types: Cigarettes    Last attempt to quit: 08/13/2013    Years since quitting: 4.0  . Smokeless tobacco: Never Used  Substance Use Topics  . Alcohol use: Yes    Comment: occ  . Drug use:  Yes    Types: Cocaine     Allergies   Amoxicillin   Review of Systems Review of Systems  Constitutional: Negative for chills and fever.  Eyes: Positive for photophobia.  Respiratory: Negative for shortness of breath.   Cardiovascular: Negative for chest pain.  Gastrointestinal: Positive for nausea and vomiting.  Neurological: Positive for headaches. Negative for syncope.  All other systems reviewed and are negative.    Physical Exam Updated Vital Signs BP (!) 192/110 (BP Location: Left Arm)   Pulse 93   Temp 98.3 F (36.8 C) (Oral)   Resp (!) 23   Ht 4\' 9"  (1.448 m)   Wt 42.6 kg (94 lb)   SpO2 100%   BMI 20.34 kg/m   Physical Exam  Constitutional: She is oriented to person, place, and time. She appears  well-developed and well-nourished. No distress.  HENT:  Head: Normocephalic and atraumatic.  Eyes: Conjunctivae are normal. Pupils are equal, round, and reactive to light. Right eye exhibits no discharge. Left eye exhibits no discharge. No scleral icterus.  Neck: Normal range of motion.  Cardiovascular: Normal rate and regular rhythm. Exam reveals no gallop and no friction rub.  No murmur heard. Pulmonary/Chest: Effort normal and breath sounds normal. No stridor. No respiratory distress. She has no wheezes. She has no rales.  Abdominal: She exhibits no distension.  Neurological: She is alert and oriented to person, place, and time.  Lying on stretcher in NAD. GCS 15. Speaks in a clear voice. Cranial nerves II through XII grossly intact. 5/5 strength in all extremities. Sensation fully intact.  Bilateral finger-to-nose intact. Ambulatory    Skin: Skin is warm and dry.  Psychiatric: She has a normal mood and affect. Her behavior is normal.  Nursing note and vitals reviewed.    ED Treatments / Results  Labs (all labs ordered are listed, but only abnormal results are displayed) Labs Reviewed - No data to display  EKG  EKG Interpretation None       Radiology No results found.  Procedures Procedures (including critical care time)  Medications Ordered in ED Medications  sodium chloride 0.9 % bolus 1,000 mL (0 mLs Intravenous Stopped 09/02/17 1618)  ketorolac (TORADOL) 15 MG/ML injection 15 mg (15 mg Intravenous Given 09/02/17 1431)  metoCLOPramide (REGLAN) injection 10 mg (10 mg Intravenous Given 09/02/17 1433)  diphenhydrAMINE (BENADRYL) injection 12.5 mg (12.5 mg Intravenous Given 09/02/17 1436)  dexamethasone (DECADRON) injection 10 mg (10 mg Intravenous Given 09/02/17 1434)     Initial Impression / Assessment and Plan / ED Course  I have reviewed the triage vital signs and the nursing notes.  Pertinent labs & imaging results that were available during my care of the  patient were reviewed by me and considered in my medical decision making (see chart for details).  37 year old female presents with a headache. Headache is similar to prior headaches. She is markedly hypertensive - has hx of HTN. Will recheck this when she is feeling better. Exam is non-focal. No red flags in history or on exam. She was given a migraine cocktail. On recheck she had complete relief of her headache. Recheck of blood pressure is still elevated but improved. She can follow up with PCP regarding this. Return precautions given.  Final Clinical Impressions(s) / ED Diagnoses   Final diagnoses:  Bad headache    ED Discharge Orders    None       Recardo Evangelist, PA-C 09/02/17 1649    Tanna Furry,  MD 09/03/17 [phone]

## 2017-11-13 ENCOUNTER — Other Ambulatory Visit: Payer: Self-pay

## 2017-11-13 ENCOUNTER — Emergency Department (HOSPITAL_COMMUNITY): Payer: Self-pay

## 2017-11-13 ENCOUNTER — Emergency Department (HOSPITAL_COMMUNITY)
Admission: EM | Admit: 2017-11-13 | Discharge: 2017-11-13 | Disposition: A | Discharge status: Home / Self Care | Payer: Self-pay | Attending: Emergency Medicine | Admitting: Emergency Medicine

## 2017-11-13 ENCOUNTER — Encounter (HOSPITAL_COMMUNITY): Payer: Self-pay

## 2017-11-13 DIAGNOSIS — I10 Essential (primary) hypertension: Secondary | ICD-10-CM | POA: Insufficient documentation

## 2017-11-13 DIAGNOSIS — R51 Headache: Secondary | ICD-10-CM

## 2017-11-13 DIAGNOSIS — S0083XA Contusion of other part of head, initial encounter: Secondary | ICD-10-CM | POA: Insufficient documentation

## 2017-11-13 DIAGNOSIS — F17290 Nicotine dependence, other tobacco product, uncomplicated: Secondary | ICD-10-CM | POA: Insufficient documentation

## 2017-11-13 DIAGNOSIS — R519 Headache, unspecified: Secondary | ICD-10-CM

## 2017-11-13 DIAGNOSIS — Y929 Unspecified place or not applicable: Secondary | ICD-10-CM | POA: Insufficient documentation

## 2017-11-13 DIAGNOSIS — Y999 Unspecified external cause status: Secondary | ICD-10-CM | POA: Insufficient documentation

## 2017-11-13 DIAGNOSIS — M25551 Pain in right hip: Secondary | ICD-10-CM | POA: Insufficient documentation

## 2017-11-13 DIAGNOSIS — Y939 Activity, unspecified: Secondary | ICD-10-CM | POA: Insufficient documentation

## 2017-11-13 LAB — I-STAT BETA HCG BLOOD, ED (MC, WL, AP ONLY): I-stat hCG, quantitative: <5 m[IU]/mL (ref <5)

## 2017-11-13 MED ORDER — KETOROLAC TROMETHAMINE 60 MG/2ML IM SOLN
60.0000 mg | Freq: Once | INTRAMUSCULAR | Status: AC
  Administered 2017-11-13: 60 mg via INTRAMUSCULAR
  Filled 2017-11-13: qty 2

## 2017-11-13 MED ORDER — TETRACAINE HCL 0.5 % OP SOLN
1.0000 [drp] | Freq: Once | OPHTHALMIC | Status: AC
  Administered 2017-11-13: 1 [drp] via OPHTHALMIC
  Filled 2017-11-13: qty 4

## 2017-11-13 MED ORDER — AMLODIPINE BESYLATE 5 MG PO TABS: 5.0000 mg | ORAL_TABLET | Freq: Every day | ORAL | 0 refills | Status: DC

## 2017-11-13 MED ORDER — BACITRACIN ZINC 500 UNIT/GM EX OINT
TOPICAL_OINTMENT | Freq: Once | CUTANEOUS | Status: AC
  Administered 2017-11-13: 1 via TOPICAL
  Filled 2017-11-13: qty 1.8

## 2017-11-13 MED ORDER — HYDROCHLOROTHIAZIDE 25 MG PO TABS: 25.0000 mg | ORAL_TABLET | Freq: Every day | ORAL | 0 refills | Status: DC

## 2017-11-13 MED ORDER — FLUORESCEIN SODIUM 1 MG OP STRP
1.0000 | ORAL_STRIP | Freq: Once | OPHTHALMIC | Status: AC
  Administered 2017-11-13: 1 via OPHTHALMIC
  Filled 2017-11-13: qty 1

## 2017-11-13 NOTE — ED Notes (Signed)
Patient ambulated to bathroom with steady gait.
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
## 2017-11-13 NOTE — Discharge Instructions (Addendum)
Your imaging today revealed no fractures.  Apply ice to the affected areas to help with the swelling.  Follow-up with your primary care doctor or the referred [facility] the next 24-48 hours for further evaluation.  Return the emergency department for any worsening pain, vision changes, guilty walking, numbness/weakness of your arms or legs, chest pain, difficulty breathing or any other worsening or concerning symptoms.
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
## 2018-03-23 ENCOUNTER — Encounter (HOSPITAL_COMMUNITY): Payer: Self-pay | Admitting: Emergency Medicine

## 2018-03-23 ENCOUNTER — Other Ambulatory Visit: Payer: Self-pay

## 2018-03-23 ENCOUNTER — Emergency Department (HOSPITAL_COMMUNITY)
Admission: EM | Admit: 2018-03-23 | Discharge: 2018-03-23 | Disposition: A | Discharge status: Home / Self Care | Payer: Self-pay | Attending: Emergency Medicine | Admitting: Emergency Medicine

## 2018-03-23 DIAGNOSIS — Z79899 Other long term (current) drug therapy: Secondary | ICD-10-CM | POA: Insufficient documentation

## 2018-03-23 DIAGNOSIS — I10 Essential (primary) hypertension: Secondary | ICD-10-CM | POA: Insufficient documentation

## 2018-03-23 DIAGNOSIS — F17290 Nicotine dependence, other tobacco product, uncomplicated: Secondary | ICD-10-CM | POA: Insufficient documentation

## 2018-03-23 DIAGNOSIS — L03113 Cellulitis of right upper limb: Secondary | ICD-10-CM

## 2018-03-23 MED ORDER — SULFAMETHOXAZOLE-TRIMETHOPRIM 800-160 MG PO TABS: 1.0000 | ORAL_TABLET | Freq: Two times a day (BID) | ORAL | 0 refills | Status: AC

## 2018-03-23 NOTE — ED Triage Notes (Signed)
Patient reports insect bite to right forearm x2 days ago. Redness noted to arm.

## 2018-03-23 NOTE — ED Provider Notes (Signed)
[facility] DEPT Provider Note   CSN: [id number] Arrival date & time: 03/23/18  1225     History   Chief Complaint Chief Complaint  Patient presents with  . Insect Bite    HPI Marissa Meyer is a 37 y.o. female.  The history is provided by the patient. No language interpreter was used.  Arm Injury   This is a new problem. The problem occurs constantly. The problem has been gradually worsening. The pain is present in the right arm. The quality of the pain is described as aching. The pain is moderate. Pertinent negatives include full range of motion. She has tried nothing for the symptoms. The treatment provided no relief. There has been no history of extremity trauma.   Pt reports she was bitten by something 2 days ago.  Pt reports increased redness and swelling Past Medical History:  Diagnosis Date  . Anemia   . Chlamydia   . Ectopic pregnancy   . Fibroid   . Gonorrhea   . Hypertension   . Mental disorder   . Migraines    but not diagnosed  . Pregnancy induced hypertension   . Preterm delivery   . Trichomonas     Patient Active Problem List   Diagnosis Date Noted  . Gestational hypertension 09/19/2015  . Chronic hypertension with exacerbation during pregnancy 09/19/2015  . No prenatal care in current pregnancy in third trimester 09/19/2015  . Pregnancy with adoption planned, currently in third trimester 09/19/2015  . Cocaine abuse affecting pregnancy in third trimester ([place]) 03/14/2014  . Tobacco use in pregnancy, antepartum 02/20/2014    Past Surgical History:  Procedure Laterality Date  . CESAREAN SECTION N/A 03/21/2014   Procedure: CESAREAN SECTION;  Surgeon: Osborne Oman, MD;  Location: [facility];  Service: Obstetrics;  Laterality: N/A;  . DILATION AND CURETTAGE OF UTERUS    . ECTOPIC PREGNANCY SURGERY       OB History    Gravida  5   Para  4   Term  0   Preterm  4   AB  1   Living  3     SAB      TAB      Ectopic  1   Multiple  0   Live Births  3            Home Medications    Prior to Admission medications   Medication Sig Start Date End Date Taking? Authorizing Provider  ibuprofen (ADVIL,MOTRIN) 200 MG tablet Take 800 mg by mouth every 6 (six) hours as needed for headache.   Yes [provider]  amLODipine (NORVASC) 5 MG tablet Take 1 tablet (5 mg total) by mouth daily. 11/13/17   Volanda Napoleon, PA-C  hydrochlorothiazide (HYDRODIURIL) 25 MG tablet Take 1 tablet (25 mg total) by mouth daily. 11/13/17 03/23/18  Volanda Napoleon, PA-C  sulfamethoxazole-trimethoprim (BACTRIM DS,SEPTRA DS) 800-160 MG tablet Take 1 tablet by mouth 2 (two) times daily for 7 days. 03/23/18 03/30/18  Fransico Meadow, PA-C    Family History Family History  Problem Relation Age of Onset  . Hypertension Mother   . Hypertension Maternal Grandmother   . Cancer Maternal Grandfather     Social History Social History   Tobacco Use  . Smoking status: Current Every Day Smoker    Packs/day: 0.25    Types: Cigars    Last attempt to quit: 08/13/2013    Years since quitting: 4.6  .  Smokeless tobacco: Never Used  Substance Use Topics  . Alcohol use: No    Frequency: Never  . Drug use: Yes    Types: Marijuana     Allergies   Amoxicillin   Review of Systems Review of Systems  Skin: Positive for color change.  All other systems reviewed and are negative.    Physical Exam Updated Vital Signs BP (!) 170/101 (BP Location: Left Arm)   Pulse 90   Temp 98.1 F (36.7 C) (Oral)   Resp 18   SpO2 99%   Physical Exam  Constitutional: She appears well-developed and well-nourished.  Skin: Skin is warm. There is erythema.  10 cm swollen area right forearm, pimple in center   Psychiatric: She has a normal mood and affect.  Nursing note and vitals reviewed.    ED Treatments / Results  Labs (all labs ordered are listed, but only abnormal results are displayed) Labs Reviewed - No data to  display  EKG None  Radiology No results found.  Procedures Procedures (including critical care time)  Medications Ordered in ED Medications - No data to display   Initial Impression / Assessment and Plan / ED Course  I have reviewed the triage vital signs and the nursing notes.  Pertinent labs & imaging results that were available during my care of the patient were reviewed by me and considered in my medical decision making (see chart for details).     MDM  Area appears to be infeted, possible bite vs early abscess.   Pt advised warm compresses.  Pt given rx for bactrim  Final Clinical Impressions(s) / ED Diagnoses   Final diagnoses:  Cellulitis of right upper extremity    ED Discharge Orders        Ordered    sulfamethoxazole-trimethoprim (BACTRIM DS,SEPTRA DS) 800-160 MG tablet  2 times daily     03/23/18 1327    An After Visit Summary was printed and given to the patient.    Sidney Ace 03/23/18 1548    Quintella Reichert, MD 03/23/18 [phone]

## 2018-04-05 ENCOUNTER — Encounter (HOSPITAL_COMMUNITY): Payer: Self-pay

## 2018-04-05 ENCOUNTER — Emergency Department (HOSPITAL_COMMUNITY)
Admission: EM | Admit: 2018-04-05 | Discharge: 2018-04-05 | Disposition: A | Discharge status: Home / Self Care | Payer: Self-pay | Attending: Emergency Medicine | Admitting: Emergency Medicine

## 2018-04-05 DIAGNOSIS — Z5189 Encounter for other specified aftercare: Secondary | ICD-10-CM | POA: Insufficient documentation

## 2018-04-05 DIAGNOSIS — R2231 Localized swelling, mass and lump, right upper limb: Secondary | ICD-10-CM | POA: Insufficient documentation

## 2018-04-05 NOTE — ED Triage Notes (Signed)
Pt reports that she has abscess on R arm, seen at [facility], finished antibiotics, no draining, wants rechecked.

## 2018-04-05 NOTE — Discharge Instructions (Addendum)
You were seen in the emergency department for a recheck of the area you were seen in the ER for a couple of weeks ago.  The area looks improved.  Please try to avoid picking at the area.  Continue to apply warm compresses 5 times per day, but not applying warm compresses keep the area clean and dry.  Keep it covered.  Please follow-up with your primary care provider within 5 to 7 days for recheck of this area as well as a recheck of your blood pressure as this was elevated in the emergency department today.  If you do not have a primary care provider you may see the [facility].  Return to the ER for new or worsening symptoms including but not limited to spreading redness, fevers, increased pain to that area, chest pain, change in your vision, trouble breathing, numbness, weakness, headaches, or any other concerns.  Vitals:   04/05/18 2024  BP: (!) 181/92  Pulse: 95  Resp: 17  Temp: 98.6 F (37 C)  SpO2: 98%

## 2018-04-05 NOTE — ED Provider Notes (Signed)
[facility] EMERGENCY DEPARTMENT Provider Note   CSN: [id number] Arrival date & time: 04/05/18  2013     History   Chief Complaint Chief Complaint  Patient presents with  . Abscess    HPI Marissa Meyer is a 37 y.o. female with a history of tobacco abuse, gonorrhea, chlamydia, anemia, hypertension, and migraines who presents to the emergency department requesting recheck of the area on her RUE. Patient seen in the emergency department 03/23/2018 and diagnosed with cellulitis to the right upper extremity, possible bite versus early abscess identified, no I&D performed, patient was recommended to utilize warm compresses and given prescription for Bactrim. She states that she applied pressure to the area last week and expressed some purulent drainage.   She completed prescription for bactrim and has been utilizing warm compresses with improvement. She states overall the area looks much better and she just wanted to have it rechecked. Area mildly uncomfortable, rates this a 1/10 in severity only when someone presses on it. No specific alleviating/aggravating factors other than what is mentioned above. Denies fever, chills, nausea, vomiting, or IVDU.   Patient's blood pressure was elevated in the emergency department today, she states she did not take her blood pressure medications this morning, she did take them yesterday.  Denies numbness, weakness, change in vision, headache, chest pain, or difficulty breathing.  HPI  Past Medical History:  Diagnosis Date  . Anemia   . Chlamydia   . Ectopic pregnancy   . Fibroid   . Gonorrhea   . Hypertension   . Mental disorder   . Migraines    but not diagnosed  . Pregnancy induced hypertension   . Preterm delivery   . Trichomonas     Patient Active Problem List   Diagnosis Date Noted  . Gestational hypertension 09/19/2015  . Chronic hypertension with exacerbation during pregnancy 09/19/2015  . No prenatal care in current  pregnancy in third trimester 09/19/2015  . Pregnancy with adoption planned, currently in third trimester 09/19/2015  . Cocaine abuse affecting pregnancy in third trimester ([place]) 03/14/2014  . Tobacco use in pregnancy, antepartum 02/20/2014    Past Surgical History:  Procedure Laterality Date  . CESAREAN SECTION N/A 03/21/2014   Procedure: CESAREAN SECTION;  Surgeon: Osborne Oman, MD;  Location: [facility];  Service: Obstetrics;  Laterality: N/A;  . DILATION AND CURETTAGE OF UTERUS    . ECTOPIC PREGNANCY SURGERY       OB History    Gravida  5   Para  4   Term  0   Preterm  4   AB  1   Living  3     SAB      TAB      Ectopic  1   Multiple  0   Live Births  3            Home Medications    Prior to Admission medications   Medication Sig Start Date End Date Taking? Authorizing Provider  amLODipine (NORVASC) 5 MG tablet Take 1 tablet (5 mg total) by mouth daily. 11/13/17   Volanda Napoleon, PA-C  hydrochlorothiazide (HYDRODIURIL) 25 MG tablet Take 1 tablet (25 mg total) by mouth daily. 11/13/17 03/23/18  Volanda Napoleon, PA-C  ibuprofen (ADVIL,MOTRIN) 200 MG tablet Take 800 mg by mouth every 6 (six) hours as needed for headache.    [provider]    Family History Family History  Problem Relation Age of Onset  .  Hypertension Mother   . Hypertension Maternal Grandmother   . Cancer Maternal Grandfather     Social History Social History   Tobacco Use  . Smoking status: Current Every Day Smoker    Packs/day: 0.25    Types: Cigars    Last attempt to quit: 08/13/2013    Years since quitting: 4.6  . Smokeless tobacco: Never Used  Substance Use Topics  . Alcohol use: No    Frequency: Never  . Drug use: Yes    Types: Marijuana     Allergies   Amoxicillin   Review of Systems Review of Systems  Constitutional: Negative for chills and fever.  Eyes: Negative for visual disturbance.  Respiratory: Negative for shortness of breath.     Cardiovascular: Negative for chest pain.  Gastrointestinal: Negative for nausea and vomiting.  Skin: Positive for wound.  Neurological: Negative for dizziness, weakness, numbness and headaches.     Physical Exam Updated Vital Signs BP (!) 181/92   Pulse 95   Temp 98.6 F (37 C)   Resp 17   SpO2 98%   Physical Exam  Constitutional: She appears well-developed and well-nourished. No distress.  HENT:  Head: Normocephalic and atraumatic.  Eyes: Conjunctivae are normal. Right eye exhibits no discharge. Left eye exhibits no discharge.  Cardiovascular:  Pulses:      Radial pulses are 2+ on the right side, and 2+ on the left side.  Musculoskeletal:  Right proximal forearm has an approximately 1 - 1.5 cm area of palpable induration with central orifice with purulent material. Minimally tender to palpation. No palpable fluctuance. No streaking erythema or warmth. Patient has normal ROM to the shoulders/elbows/wrists. No bony areas of tenderness.   Neurological: She is alert.  Clear speech.  Sensation grossly intact bilateral upper extremities.  5 out of 5 symmetric grip strength.  Will perform okay sign, thumbs up, and cross second/third digits bilaterally.  Psychiatric: She has a normal mood and affect. Her behavior is normal. Thought content normal.  Nursing note and vitals reviewed.       ED Treatments / Results  Labs (all labs ordered are listed, but only abnormal results are displayed) Labs Reviewed - No data to display  EKG None  Radiology No results found.  Procedures Procedures (including critical care time) EMERGENCY DEPARTMENT US SOFT TISSUE INTERPRETATION "Study: Limited Soft Tissue Ultrasound"  INDICATIONS: Soft tissue infection Multiple views of the body part were obtained in real-time with a multi-frequency linear probe  PERFORMED BY: Myself IMAGES ARCHIVED?: No SIDE:Right  BODY PART:Upper extremity INTERPRETATION:  No abcess noted   Medications  Ordered in ED Medications - No data to display   Initial Impression / Assessment and Plan / ED Course  I have reviewed the triage vital signs and the nursing notes.  Pertinent labs & imaging results that were available during my care of the patient were reviewed by me and considered in my medical decision making (see chart for details).   Patient presents to the ED for recheck of wound. Patient nontoxic appearing, in no apparent distress, vitals WNL with the exception of elevated blood pressure in the setting of not having her BP meds this morning, doubt hypertensive emergency, discussed need for med compliance and PCP recheck.  Upon review of note from prior ER visit, it appears that patient's right upper extremity is significantly improved, she agrees.  Bedside ultrasound performed, no drainable fluid collection present.  There is not significant surrounding erythema or warmth, patient is afebrile, do  not feel that repeat course of antibiotics is necessary at this time.  Recommended continued warm compresses and avoiding manipulation to the area.  Recommended PCP follow-up for recheck.. I discussed treatment plan, need for PCP follow-up, and return precautions with the patient. Provided opportunity for questions, patient confirmed understanding and is in agreement with plan.    Final Clinical Impressions(s) / ED Diagnoses   Final diagnoses:  Wound check, abscess    ED Discharge Orders    None       Leafy Kindle 04/06/18 0124    Virgel Manifold, MD 04/08/18 [phone]

## 2018-07-07 ENCOUNTER — Emergency Department (HOSPITAL_COMMUNITY)
Admission: EM | Admit: 2018-07-07 | Discharge: 2018-07-07 | Disposition: A | Discharge status: Home / Self Care | Payer: Self-pay | Attending: Emergency Medicine | Admitting: Emergency Medicine

## 2018-07-07 ENCOUNTER — Encounter (HOSPITAL_COMMUNITY): Payer: Self-pay | Admitting: Emergency Medicine

## 2018-07-07 DIAGNOSIS — R51 Headache: Secondary | ICD-10-CM | POA: Insufficient documentation

## 2018-07-07 DIAGNOSIS — I10 Essential (primary) hypertension: Secondary | ICD-10-CM | POA: Insufficient documentation

## 2018-07-07 DIAGNOSIS — F17210 Nicotine dependence, cigarettes, uncomplicated: Secondary | ICD-10-CM | POA: Insufficient documentation

## 2018-07-07 DIAGNOSIS — R519 Headache, unspecified: Secondary | ICD-10-CM

## 2018-07-07 DIAGNOSIS — Z3202 Encounter for pregnancy test, result negative: Secondary | ICD-10-CM | POA: Insufficient documentation

## 2018-07-07 LAB — POC URINE PREG, ED: PREG TEST UR: NEGATIVE

## 2018-07-07 MED ORDER — DIPHENHYDRAMINE HCL 50 MG/ML IJ SOLN
12.5000 mg | Freq: Once | INTRAMUSCULAR | Status: DC
  Filled 2018-07-07: qty 1

## 2018-07-07 MED ORDER — SODIUM CHLORIDE 0.9 % IV BOLUS: 1000.0000 mL | Freq: Once | INTRAVENOUS | Status: DC

## 2018-07-07 MED ORDER — ACETAMINOPHEN 325 MG PO TABS
650.0000 mg | ORAL_TABLET | Freq: Once | ORAL | Status: AC
  Administered 2018-07-07: 650 mg via ORAL
  Filled 2018-07-07: qty 2

## 2018-07-07 MED ORDER — AMLODIPINE BESYLATE 5 MG PO TABS: 5.0000 mg | ORAL_TABLET | Freq: Every day | ORAL | 0 refills | Status: DC

## 2018-07-07 MED ORDER — HYDROCHLOROTHIAZIDE 25 MG PO TABS: 25.0000 mg | ORAL_TABLET | Freq: Every day | ORAL | 0 refills | Status: DC

## 2018-07-07 MED ORDER — PROCHLORPERAZINE EDISYLATE 10 MG/2ML IJ SOLN
10.0000 mg | Freq: Once | INTRAMUSCULAR | Status: DC
  Filled 2018-07-07: qty 2

## 2018-07-07 MED ORDER — IBUPROFEN 800 MG PO TABS
800.0000 mg | ORAL_TABLET | Freq: Once | ORAL | Status: AC
  Administered 2018-07-07: 800 mg via ORAL
  Filled 2018-07-07: qty 1

## 2018-07-07 MED ORDER — KETOROLAC TROMETHAMINE 30 MG/ML IJ SOLN
30.0000 mg | Freq: Once | INTRAMUSCULAR | Status: DC
Start: 2018-07-07 — End: 2018-07-07
  Filled 2018-07-07: qty 1

## 2018-07-07 NOTE — Discharge Instructions (Signed)
Your Norvasc is $4 and hydrochlorothiazide is $10 at [facility] (you won't need a coupon to get these prices).   It is important to take your blood pressure medications as prescribed.   Follow up with your primary care doctor in the next week or two for further refills and management of your blood pressure.  If you do not have a primary care doctor, please see the information below.  Return to ER for new or worsening symptoms, any additional concerns.  To find a primary care or specialty doctor please call [phone] or [phone] to access "[facility]."  You may also go on the [facility] website at [email or web address]  There are also multiple [facility] and [facility] practices throughout the [place] that are frequently accepting new patients. You may find a clinic that is close to your home and contact them.  [place] [id number] [phone]  [facility] in [place] (also locations in [place] and [place]) - [place] [phone]  [place] [phone]

## 2018-07-07 NOTE — ED Provider Notes (Signed)
[facility] DEPT Provider Note   CSN: [id number] Arrival date & time: 07/07/18  1343     History   Chief Complaint Chief Complaint  Patient presents with  . Migraine  . Hypertension  . wants pregnancy test    HPI Marissa Meyer is a 37 y.o. female.  The history is provided by the patient and medical records. No language interpreter was used.  Migraine  Associated symptoms include headaches.  Hypertension  Associated symptoms include headaches.  Marissa Meyer is a 37 y.o. female  with PMH of migraines who presents to the Emergency Department complaining of headache.  Patient states that she woke up this morning with a headache consistent with her typical migraine.  Patient states that she actually has had an improvement in her headache since she woke up.  She still feels like there is a little "tightness" to the frontal aspect of her head like her headache is lingering.  No medications taken prior to arrival for symptoms.  She does report history of hypertension and has been out of her medications for about 4 months due to lack of insurance.  She states that sometimes she thinks her blood pressure causes her migraines.  She denies any visual changes, neck pain, abdominal pain.  Patient states that she also would like a pregnancy test today.  She states that she had a.  The beginning of the month, however it was not like her usual cycle as it was just spotting.  She then had some spotting again a few days ago.  Denies any vaginal discharge.  Denies abdominal pain, nausea or vomiting.   Past Medical History:  Diagnosis Date  . Anemia   . Chlamydia   . Ectopic pregnancy   . Fibroid   . Gonorrhea   . Hypertension   . Mental disorder   . Migraines    but not diagnosed  . Pregnancy induced hypertension   . Preterm delivery   . Trichomonas     Patient Active Problem List   Diagnosis Date Noted  . Gestational hypertension 09/19/2015  . Chronic  hypertension with exacerbation during pregnancy 09/19/2015  . No prenatal care in current pregnancy in third trimester 09/19/2015  . Pregnancy with adoption planned, currently in third trimester 09/19/2015  . Cocaine abuse affecting pregnancy in third trimester ([place]) 03/14/2014  . Tobacco use in pregnancy, antepartum 02/20/2014    Past Surgical History:  Procedure Laterality Date  . CESAREAN SECTION N/A 03/21/2014   Procedure: CESAREAN SECTION;  Surgeon: Osborne Oman, MD;  Location: [facility];  Service: Obstetrics;  Laterality: N/A;  . DILATION AND CURETTAGE OF UTERUS    . ECTOPIC PREGNANCY SURGERY       OB History    Gravida  5   Para  4   Term  0   Preterm  4   AB  1   Living  3     SAB      TAB      Ectopic  1   Multiple  0   Live Births  3            Home Medications    Prior to Admission medications   Medication Sig Start Date End Date Taking? Authorizing Provider  ibuprofen (ADVIL,MOTRIN) 200 MG tablet Take 800 mg by mouth every 6 (six) hours as needed for headache.   Yes [provider]  amLODipine (NORVASC) 5 MG tablet Take 1 tablet (5 mg total)  by mouth daily. 07/07/18   Ward, Ozella Almond, PA-C  hydrochlorothiazide (HYDRODIURIL) 25 MG tablet Take 1 tablet (25 mg total) by mouth daily. 07/07/18 08/06/18  Ward, Ozella Almond, PA-C    Family History Family History  Problem Relation Age of Onset  . Hypertension Mother   . Hypertension Maternal Grandmother   . Cancer Maternal Grandfather     Social History Social History   Tobacco Use  . Smoking status: Current Every Day Smoker    Packs/day: 0.25    Types: Cigars    Last attempt to quit: 08/13/2013    Years since quitting: 4.9  . Smokeless tobacco: Never Used  Substance Use Topics  . Alcohol use: No    Frequency: Never  . Drug use: Yes    Types: Marijuana     Allergies   Amoxicillin   Review of Systems Review of Systems  Genitourinary: Positive for vaginal bleeding.    Neurological: Positive for headaches.  All other systems reviewed and are negative.    Physical Exam Updated Vital Signs BP (!) 169/113   Pulse 90   Temp 99 F (37.2 C) (Oral)   Resp 18   LMP 06/25/2018   SpO2 99%   Physical Exam  Constitutional: She is oriented to person, place, and time. She appears well-developed and well-nourished. No distress.  HENT:  Head: Normocephalic and atraumatic.  Mouth/Throat: Oropharynx is clear and moist.  No tenderness of the temporal artery   Eyes: Pupils are equal, round, and reactive to light. Conjunctivae and EOM are normal. No scleral icterus.  No nystagmus   Neck: Normal range of motion. Neck supple.  Full active and passive ROM without pain.  No midline or paraspinal tenderness. No nuchal rigidity or meningeal signs.  Cardiovascular: Normal rate, regular rhythm, normal heart sounds and intact distal pulses.  Pulmonary/Chest: Effort normal and breath sounds normal. No respiratory distress. She has no wheezes. She has no rales.  Abdominal: Soft. Bowel sounds are normal. She exhibits no distension. There is no tenderness. There is no rebound and no guarding.  Musculoskeletal: Normal range of motion.  Lymphadenopathy:    She has no cervical adenopathy.  Neurological: She is alert and oriented to person, place, and time. She has normal reflexes. No cranial nerve deficit. Coordination normal.  Alert, oriented, thought content appropriate, able to give a coherent history. Speech is clear and goal oriented, able to follow commands.  Cranial Nerves:  II:  Peripheral visual fields grossly normal, pupils equal, round, reactive to light III, IV, VI: EOM intact bilaterally, ptosis not present V,VII: smile symmetric, eyes kept closed tightly against resistance, facial light touch sensation equal VIII: hearing grossly normal IX, X: symmetric soft palate movement, uvula elevates symmetrically  XI: bilateral shoulder shrug symmetric and strong XII:  midline tongue extension 5/5 muscle strength in upper and lower extremities bilaterally including strong and equal grip strength and dorsiflexion/plantar flexion Sensory to light touch normal in all four extremities.  No drift. Steady gait.  Skin: Skin is warm and dry. No rash noted. She is not diaphoretic.  Nursing note and vitals reviewed.    ED Treatments / Results  Labs (all labs ordered are listed, but only abnormal results are displayed) Labs Reviewed  POC URINE PREG, ED    EKG None  Radiology No results found.  Procedures Procedures (including critical care time)  Medications Ordered in ED Medications  ibuprofen (ADVIL,MOTRIN) tablet 800 mg (800 mg Oral Given 07/07/18 1745)  acetaminophen (TYLENOL) tablet 650  mg (650 mg Oral Given 07/07/18 1745)     Initial Impression / Assessment and Plan / ED Course  I have reviewed the triage vital signs and the nursing notes.  Pertinent labs & imaging results that were available during my care of the patient were reviewed by me and considered in my medical decision making (see chart for details).    Marissa Meyer is a 37 y.o. female who presents to ED requesting pregnancy test and also for headache consistent with her usual migraine which has improved.  No focal neuro deficits on exam.  She denies any neurologic symptoms such as visual changes, focal numbness/weakness, balance problems, confusion or speech difficulty.  Initially was going to give migraine cocktail, however patient did not want IV and requesting Tylenol or Motrin.  Both were provided. Pregnancy test negative. Not having abdominal pain. Benign abdominal exam.  He has been out of her blood pressure medication for several months now.  These were refilled for her.  Primary care follow-up strongly encouraged. Evaluation does not show pathology that would require ongoing emergent intervention or inpatient treatment. I have reviewed return precautions including development of  neurologic symptoms, confusion, lethargy, difficulty speaking, or new/worsening/concerning symptoms. All questions answered.  Final Clinical Impressions(s) / ED Diagnoses   Final diagnoses:  Bad headache  Encounter for pregnancy test, result negative    ED Discharge Orders         Ordered    amLODipine (NORVASC) 5 MG tablet  Daily     07/07/18 1745    hydrochlorothiazide (HYDRODIURIL) 25 MG tablet  Daily     07/07/18 1745           Ward, Ozella Almond, PA-C 07/07/18 1852    Virgel Manifold, MD 07/08/18 [phone]

## 2018-07-07 NOTE — ED Triage Notes (Signed)
Pt reports she woke up with migraine this morning and her BP was high. Reports dont have insurance so hasnt had HTN meds in over month.  Also adds that she wants a pregnancy test. Reports that had period last week and one at the beginning of the month as well.

## 2018-07-10 ENCOUNTER — Inpatient Hospital Stay (HOSPITAL_COMMUNITY)
Admission: AD | Admit: 2018-07-10 | Discharge: 2018-07-10 | Discharge status: Against Medical Advice | Payer: Self-pay | Source: Ambulatory Visit | Attending: Obstetrics & Gynecology | Admitting: Obstetrics & Gynecology

## 2018-07-10 DIAGNOSIS — R109 Unspecified abdominal pain: Secondary | ICD-10-CM | POA: Insufficient documentation

## 2018-07-10 LAB — POCT PREGNANCY, URINE: Preg Test, Ur: NEGATIVE

## 2018-07-10 NOTE — MAU Note (Signed)
Not I n lobby x 1

## 2018-07-10 NOTE — MAU Note (Signed)
Not in lobby x2.

## 2018-07-10 NOTE — MAU Note (Signed)
Pt stated she has had abd pain x 2 -3 weeks. Went to [facility] and they told her she was not pregnant. Pt stated she feels like something is moving. And want sto make sure she is not pregnant and what is causing her abd pain

## 2018-07-11 LAB — URINE CULTURE: CULTURE: NO GROWTH

## 2018-07-17 ENCOUNTER — Other Ambulatory Visit: Payer: Self-pay

## 2018-07-17 DIAGNOSIS — Z3200 Encounter for pregnancy test, result unknown: Secondary | ICD-10-CM

## 2018-07-18 LAB — BETA HCG QUANT (REF LAB)

## 2018-08-06 ENCOUNTER — Encounter: Payer: Self-pay | Admitting: Emergency Medicine

## 2018-08-06 ENCOUNTER — Emergency Department (HOSPITAL_COMMUNITY)
Admission: EM | Admit: 2018-08-06 | Discharge: 2018-08-06 | Disposition: A | Discharge status: Home / Self Care | Payer: Self-pay | Attending: Emergency Medicine | Admitting: Emergency Medicine

## 2018-08-06 DIAGNOSIS — K047 Periapical abscess without sinus: Secondary | ICD-10-CM | POA: Insufficient documentation

## 2018-08-06 DIAGNOSIS — F17210 Nicotine dependence, cigarettes, uncomplicated: Secondary | ICD-10-CM | POA: Insufficient documentation

## 2018-08-06 DIAGNOSIS — Z79899 Other long term (current) drug therapy: Secondary | ICD-10-CM | POA: Insufficient documentation

## 2018-08-06 DIAGNOSIS — I10 Essential (primary) hypertension: Secondary | ICD-10-CM | POA: Insufficient documentation

## 2018-08-06 MED ORDER — ACETAMINOPHEN 500 MG PO TABS: 500.0000 mg | ORAL_TABLET | Freq: Four times a day (QID) | ORAL | 0 refills | Status: DC | PRN

## 2018-08-06 MED ORDER — HYDROCHLOROTHIAZIDE 25 MG PO TABS: 25.0000 mg | ORAL_TABLET | Freq: Every day | ORAL | 0 refills | Status: DC

## 2018-08-06 MED ORDER — HYDROCODONE-ACETAMINOPHEN 5-325 MG PO TABS
1.0000 | ORAL_TABLET | Freq: Once | ORAL | Status: AC
  Administered 2018-08-06: 1 via ORAL
  Filled 2018-08-06: qty 1

## 2018-08-06 MED ORDER — CLINDAMYCIN HCL 150 MG PO CAPS
300.0000 mg | ORAL_CAPSULE | Freq: Once | ORAL | Status: AC
  Administered 2018-08-06: 300 mg via ORAL
  Filled 2018-08-06: qty 2

## 2018-08-06 MED ORDER — AMLODIPINE BESYLATE 5 MG PO TABS: 5.0000 mg | ORAL_TABLET | Freq: Every day | ORAL | 0 refills | Status: DC

## 2018-08-06 MED ORDER — CLINDAMYCIN HCL 300 MG PO CAPS: 300.0000 mg | ORAL_CAPSULE | Freq: Three times a day (TID) | ORAL | 0 refills | Status: DC

## 2018-08-06 NOTE — Discharge Instructions (Signed)
Follow up with the Primary care doctor at [facility] 08/15/2018 at 9:30 am.

## 2018-08-06 NOTE — ED Notes (Signed)
Pt states she has high BP but cannot afford to get her medication/doesn't have a primary MD

## 2018-08-06 NOTE — ED Provider Notes (Signed)
[facility] EMERGENCY DEPARTMENT Provider Note   CSN: [id number] Arrival date & time: 08/06/18  1522     History   Chief Complaint Chief Complaint  Patient presents with  . Dental Pain    HPI Marissa Meyer is a 37 y.o. female who presents to the ED with dental pain. The pain started a week ago and has gotten worse. Patient reports taking tylenol and Advil for pain with out relief. The pain is located in the left lower dental area. Patient does not have a dentist. Patient also reports that she is out of her blood pressure medication. She was at [facility] last month and given Rx but did not have money to fill Rx. Patient also reports no PCP.   HPI  Past Medical History:  Diagnosis Date  . Anemia   . Chlamydia   . Ectopic pregnancy   . Fibroid   . Gonorrhea   . Hypertension   . Mental disorder   . Migraines    but not diagnosed  . Pregnancy induced hypertension   . Preterm delivery   . Trichomonas     Patient Active Problem List   Diagnosis Date Noted  . Gestational hypertension 09/19/2015  . Chronic hypertension with exacerbation during pregnancy 09/19/2015  . No prenatal care in current pregnancy in third trimester 09/19/2015  . Pregnancy with adoption planned, currently in third trimester 09/19/2015  . Cocaine abuse affecting pregnancy in third trimester ([place]) 03/14/2014  . Tobacco use in pregnancy, antepartum 02/20/2014    Past Surgical History:  Procedure Laterality Date  . CESAREAN SECTION N/A 03/21/2014   Procedure: CESAREAN SECTION;  Surgeon: Osborne Oman, MD;  Location: [facility];  Service: Obstetrics;  Laterality: N/A;  . DILATION AND CURETTAGE OF UTERUS    . ECTOPIC PREGNANCY SURGERY       OB History    Gravida  5   Para  4   Term  0   Preterm  4   AB  1   Living  3     SAB      TAB      Ectopic  1   Multiple  0   Live Births  3            Home Medications    Prior to Admission medications   Medication Sig  Start Date End Date Taking? Authorizing Provider  acetaminophen (TYLENOL) 500 MG tablet Take 1 tablet (500 mg total) by mouth every 6 (six) hours as needed. 08/06/18   Ashley Murrain, NP  amLODipine (NORVASC) 5 MG tablet Take 1 tablet (5 mg total) by mouth daily. 08/06/18   Ashley Murrain, NP  clindamycin (CLEOCIN) 300 MG capsule Take 1 capsule (300 mg total) by mouth 3 (three) times daily. 08/06/18   Ashley Murrain, NP  hydrochlorothiazide (HYDRODIURIL) 25 MG tablet Take 1 tablet (25 mg total) by mouth daily. 08/06/18   Ashley Murrain, NP    Family History Family History  Problem Relation Age of Onset  . Hypertension Mother   . Hypertension Maternal Grandmother   . Cancer Maternal Grandfather     Social History Social History   Tobacco Use  . Smoking status: Current Every Day Smoker    Packs/day: 0.25    Types: Cigars    Last attempt to quit: 08/13/2013    Years since quitting: 4.9  . Smokeless tobacco: Never Used  Substance Use Topics  . Alcohol use: No  Frequency: Never  . Drug use: Yes    Types: Marijuana     Allergies   Amoxicillin   Review of Systems Review of Systems  Constitutional: Positive for chills. Negative for fever.  HENT: Positive for dental problem and ear pain. Negative for ear discharge.   Eyes: Negative for visual disturbance.  Respiratory: Negative for shortness of breath.   Cardiovascular: Negative for chest pain.  Gastrointestinal: Negative for abdominal pain, nausea and vomiting.  Musculoskeletal: Negative for neck stiffness.  Skin: Negative for rash.  Neurological: Negative for dizziness and headaches.  Hematological: Positive for adenopathy.  Psychiatric/Behavioral: Negative for confusion.     Physical Exam Updated Vital Signs BP (!) 169/101 (BP Location: Right Arm)   Pulse 84   Temp 98.4 F (36.9 C) (Oral)   Resp 13   Ht 4\' 9"  (1.448 m)   Wt 40.4 kg   LMP 07/30/2018   SpO2 100%   BMI 19.26 kg/m   Physical Exam    Constitutional: She appears well-developed and well-nourished. No distress.  HENT:  Head: Normocephalic and atraumatic.  Right Ear: Tympanic membrane normal.  Left Ear: Tympanic membrane normal.  Nose: Nose normal.  Mouth/Throat: Uvula is midline and oropharynx is clear and moist. Dental caries present.    Left second molar decayed to the gumline, swelling of the gum surrounding the tooth, tender on exam.   Eyes: Conjunctivae are normal.  Neck: Normal range of motion. Neck supple.  Cardiovascular: Normal rate and regular rhythm.  Pulmonary/Chest: Effort normal and breath sounds normal.  Abdominal: Soft. There is no tenderness.  Musculoskeletal: Normal range of motion.  Lymphadenopathy:    She has cervical adenopathy.  Neurological: She is alert.  Skin: Skin is warm and dry.  Psychiatric: She has a normal mood and affect. Her behavior is normal.  Nursing note and vitals reviewed.    ED Treatments / Results  Labs (all labs ordered are listed, but only abnormal results are displayed) Labs Reviewed - No data to display  Radiology No results found.  Procedures Procedures (including critical care time)  Medications Ordered in ED Medications  HYDROcodone-acetaminophen (NORCO/VICODIN) 5-325 MG per tablet 1 tablet (1 tablet Oral Given 08/06/18 1636)  clindamycin (CLEOCIN) capsule 300 mg (300 mg Oral Given 08/06/18 1636)     Initial Impression / Assessment and Plan / ED Course  I have reviewed the triage vital signs and the nursing notes. Patient with toothache.  No gross abscess.  Exam unconcerning for Ludwig's angina or spread of infection.  Will treat with penicillin and anti-inflammatories medicine.  Urged patient to follow-up with dentist.   Patient also with HTN that she has not had medication for in "a while". Case Chemical engineer. Appointment made for follow up and will have Rx filled for patient.   Final Clinical Impressions(s) / ED Diagnoses   Final diagnoses:   Dental infection  Hypertension, essential    ED Discharge Orders         Ordered    clindamycin (CLEOCIN) 300 MG capsule  3 times daily     08/06/18 1646    acetaminophen (TYLENOL) 500 MG tablet  Every 6 hours PRN     08/06/18 1646    hydrochlorothiazide (HYDRODIURIL) 25 MG tablet  Daily     08/06/18 1646    amLODipine (NORVASC) 5 MG tablet  Daily     08/06/18 1646           Debroah Baller [place], [place] 08/06/18 1701  Orlie Dakin, MD 08/06/18 [phone]

## 2018-08-06 NOTE — ED Triage Notes (Signed)
Pt states she chipped her left bottom molar a month ago. This past week she started having pain in that site. Now pt reports swelling and some drainage from the tooth.

## 2018-08-15 ENCOUNTER — Encounter: Payer: Self-pay | Admitting: Family Medicine

## 2018-08-15 ENCOUNTER — Ambulatory Visit (INDEPENDENT_AMBULATORY_CARE_PROVIDER_SITE_OTHER): Payer: Self-pay | Admitting: Family Medicine

## 2018-08-15 VITALS — BP 138/90 | HR 98 | Resp 17 | Ht 59.0 in | Wt 97.4 lb

## 2018-08-15 DIAGNOSIS — I10 Essential (primary) hypertension: Secondary | ICD-10-CM

## 2018-08-15 DIAGNOSIS — K047 Periapical abscess without sinus: Secondary | ICD-10-CM

## 2018-08-15 DIAGNOSIS — R0602 Shortness of breath: Secondary | ICD-10-CM

## 2018-08-15 DIAGNOSIS — R079 Chest pain, unspecified: Secondary | ICD-10-CM

## 2018-08-15 DIAGNOSIS — Z131 Encounter for screening for diabetes mellitus: Secondary | ICD-10-CM

## 2018-08-15 DIAGNOSIS — Z113 Encounter for screening for infections with a predominantly sexual mode of transmission: Secondary | ICD-10-CM

## 2018-08-15 DIAGNOSIS — M79601 Pain in right arm: Secondary | ICD-10-CM

## 2018-08-15 DIAGNOSIS — Z3202 Encounter for pregnancy test, result negative: Secondary | ICD-10-CM

## 2018-08-15 DIAGNOSIS — Z7689 Persons encountering health services in other specified circumstances: Secondary | ICD-10-CM

## 2018-08-15 LAB — POCT URINALYSIS DIP (CLINITEK)
Bilirubin, UA: NEGATIVE
Blood, UA: NEGATIVE
GLUCOSE UA: NEGATIVE mg/dL
Ketones, POC UA: NEGATIVE mg/dL
NITRITE UA: NEGATIVE
POC PROTEIN,UA: NEGATIVE
Spec Grav, UA: 1.02 (ref 1.010–1.025)
Urobilinogen, UA: 0.2 E.U./dL
pH, UA: 5.5 (ref 5.0–8.0)

## 2018-08-15 LAB — POCT URINE PREGNANCY: Preg Test, Ur: NEGATIVE

## 2018-08-15 MED ORDER — MELOXICAM 15 MG PO TABS: 15.0000 mg | ORAL_TABLET | Freq: Every day | ORAL | 0 refills | Status: DC

## 2018-08-15 MED ORDER — AMLODIPINE BESYLATE 5 MG PO TABS: 5.0000 mg | ORAL_TABLET | Freq: Every day | ORAL | 5 refills | Status: DC

## 2018-08-15 MED ORDER — HYDROCHLOROTHIAZIDE 25 MG PO TABS: 25.0000 mg | ORAL_TABLET | Freq: Every day | ORAL | 5 refills | Status: DC

## 2018-08-15 NOTE — Progress Notes (Signed)
Marissa Meyer, is a 37 y.o. female  ZOX:[id number]  [id number]  DOB - 1980/12/19  CC:  Chief Complaint  Patient presents with  . Establish Care  . Follow-up    ED 11/25: dental pain. states that pain has improved since starting antibiotics       HPI: Marissa Meyer is a 37 y.o. female is here today to establish care.   [name] has Tobacco use in pregnancy, antepartum; Cocaine abuse affecting pregnancy in third trimester ([place]); Gestational hypertension; Chronic hypertension with exacerbation during pregnancy; No prenatal care in current pregnancy in third trimester; and Pregnancy with adoption planned, currently in third trimester on their problem list.    Today's visit:  Here to establish care today.  Recent ED visit for evaluation of tooth pain on 08/06/2018.  Patient has not seen the dentist since visit.  She was placed on clindamycin 300 mg 3 times daily for oral infection.  Patient's medical history is significant for hypertension.  During her ED visit she was resumed on her chronic antihypertension medications.     Chest pain  With working and physical activity. Shortness of breath with activity.  Family history of high blood pressure (mother and grandmother)  Diagnosed with gestational hypertension during pregnancy. Current smoker. She smokes cigars regularly and cigarettes occasionally. No history of asthma or COPD. No alcohol or substance use at present. She denies diaphoresis, nausea, new weakness , numbness or tingling, SOB, edema, or worrisome cough.   Current medications: Current Outpatient Medications:  .  acetaminophen (TYLENOL) 500 MG tablet, Take 1 tablet (500 mg total) by mouth every 6 (six) hours as needed., Disp: 30 tablet, Rfl: 0 .  amLODipine (NORVASC) 5 MG tablet, Take 1 tablet (5 mg total) by mouth daily., Disp: 30 tablet, Rfl: 0 .  clindamycin (CLEOCIN) 300 MG capsule, Take 1 capsule (300 mg total) by mouth 3 (three) times daily., Disp: 30 capsule, Rfl:  0 .  hydrochlorothiazide (HYDRODIURIL) 25 MG tablet, Take 1 tablet (25 mg total) by mouth daily., Disp: 30 tablet, Rfl: 0   Pertinent family medical history: family history includes Cancer in her maternal grandfather; Hypertension in her maternal grandmother and mother.   Allergies  Allergen Reactions  . Amoxicillin Hives    Social History   Socioeconomic History  . Marital status: Single    Spouse name: Not on file  . Number of children: Not on file  . Years of education: Not on file  . Highest education level: Not on file  Occupational History  . Not on file  Social Needs  . Financial resource strain: Not on file  . Food insecurity:    Worry: Not on file    Inability: Not on file  . Transportation needs:    Medical: Not on file    Non-medical: Not on file  Tobacco Use  . Smoking status: Current Every Day Smoker    Packs/day: 0.25    Types: Cigars    Last attempt to quit: 08/13/2013    Years since quitting: 5.0  . Smokeless tobacco: Never Used  Substance and Sexual Activity  . Alcohol use: No    Frequency: Never  . Drug use: Yes    Types: Marijuana  . Sexual activity: Yes    Birth control/protection: None    Comment: Last intercourse 3 days ago  Lifestyle  . Physical activity:    Days per week: Not on file    Minutes per session: Not on file  . Stress: Not on  file  Relationships  . Social connections:    Talks on phone: Not on file    Gets together: Not on file    Attends religious service: Not on file    Active member of club or organization: Not on file    Attends meetings of clubs or organizations: Not on file    Relationship status: Not on file  . Intimate partner violence:    Fear of current or ex partner: Not on file    Emotionally abused: Not on file    Physically abused: Not on file    Forced sexual activity: Not on file  Other Topics Concern  . Not on file  Social History Narrative  . Not on file    Review of Systems: Constitutional:  Negative for fever, chills, diaphoresis, activity change, appetite change and fatigue. HENT: Negative for ear pain, nosebleeds, congestion, facial swelling, rhinorrhea, neck pain, neck stiffness and ear discharge.  Eyes: Negative for pain, discharge, redness, itching and visual disturbance. Respiratory: Negative for cough, choking, chest tightness, shortness of breath, wheezing and stridor.  Cardiovascular: see HPI Gastrointestinal: Negative for abdominal distention. Genitourinary: request STD testing. No known exposure  Musculoskeletal: Negative for back pain, joint swelling, arthralgia and gait problem. Neurological: Negative for dizziness, tremors, seizures, syncope, facial asymmetry, speech difficulty, weakness, light-headedness, numbness and headaches.  Hematological: Negative for adenopathy. Does not bruise/bleed easily. Psychiatric/Behavioral: Negative for hallucinations, behavioral problems, confusion, dysphoric mood, decreased concentration and agitation.    Objective:   Vitals:   08/15/18 0924  BP: 138/90  Pulse: 98  Resp: 17  SpO2: 98%    BP Readings from Last 3 Encounters:  08/15/18 138/90  08/06/18 (!) 169/101  07/10/18 (!) 165/92    Filed Weights   08/15/18 0924  Weight: 97 lb 6.4 oz (44.2 kg)      Physical Exam: Constitutional: Patient appears well-developed and well-nourished. No distress. HENT: Normocephalic, atraumatic, External right and left ear normal. Oropharynx is clear and moist.  Eyes: Conjunctivae and EOM are normal. PERRLA, no scleral icterus. Neck: Normal ROM. Neck supple. No JVD. No tracheal deviation. No thyromegaly. CVS: RRR, S1/S2 +, no murmurs, no gallops, no carotid bruit.  Pulmonary: Effort and breath sounds normal, no stridor, rhonchi, wheezes, rales.  Abdominal: Soft. BS +, no distension, tenderness, rebound or guarding.  Musculoskeletal: Normal range of motion. No edema and no tenderness.  Neuro: Alert. Normal muscle tone coordination.  Normal gait. BUE and BLE strength 5/5. Bilateral hand grips symmetrical. No cranial nerve deficit. Skin: Skin is warm and dry. No rash noted. Not diaphoretic. No erythema. No pallor. Psychiatric: Normal mood and affect. Behavior, judgment, thought content normal.  Lab Results (prior encounters)  Lab Results  Component Value Date   WBC 14.7 (H) 05/19/2016   HGB 14.2 05/19/2016   HCT 41.2 05/19/2016   MCV 92.8 05/19/2016   PLT 223 05/19/2016   Lab Results  Component Value Date   CREATININE 0.95 05/19/2016   BUN 14 05/19/2016   NA 140 05/19/2016   K 3.7 05/19/2016   CL 108 05/19/2016   CO2 26 05/19/2016        Assessment and plan:  1. Encounter to establish care 2. Chest pain, unspecified type Given long-standing history of uncontrolled high blood pressure, chronic tobacco use, abnormal EKG, patient warrants further work-up by cardiology.  - EKG 12-Lead - CBC with Differential - Ambulatory referral to Cardiology  3. Shortness of breath - likely secondary to smoking although can't rule out cardiovascular  related disease as a source of symptoms. Cardiology referral placed.   4. Dental infection -resolved, recommended dental follow-up   5. Screen for STD (sexually transmitted disease) - POCT URINALYSIS DIP ([facility]) - HIV antibody (with reflex) - GC/Chlamydia Probe Amp([facility]) - POCT urine pregnancy  6. Screening for diabetes mellitus - Comprehensive metabolic panel - Hemoglobin A1c  7. Essential hypertension, controlled We have discussed target BP range and blood pressure goal. I have advised patient to check BP regularly and to call us back or report to clinic if the numbers are consistently higher than 140/90. We discussed the importance of compliance with medical therapy and DASH diet recommended, consequences of uncontrolled hypertension discussed. Continue current medications. Checking: - Lipid panel - Thyroid Panel With TSH  8. Right arm pain, trial Mobic 15  mg once daily. If no improvement, will obtain imagin g.  Return in about 1 month (around 09/15/2018) for BP Follow-up. needs  financial packet .   The patient was given clear instructions to go to ER or return to medical center if symptoms don't improve, worsen or new problems develop. The patient verbalized understanding. The patient was advised  to call and obtain lab results if they haven't heard anything from out office within 7-10 business days.  Molli Barrows, FNP Primary Care at [facility] [address], [place] [address] [phone]: [phone]    This note has been created with Dragon speech recognition software and Engineer, materials. Any transcriptional errors are unintentional.

## 2018-08-15 NOTE — Patient Instructions (Addendum)
Thank you for choosing Primary Care at [facility] to be your medical home!    Marissa Meyer was seen by Molli Barrows, FNP today.   Marissa Meyer's primary care provider is Scot Jun, FNP.   For the best care possible, you should try to see Molli Barrows, FNP-C whenever you come to the clinic.   We look forward to seeing you again soon!  If you have any questions about your visit today, please call us at [phone] or feel free to reach your primary care provider via [place].     Your EKG showed left heart valve enlargement which is likely related to uncontrolled hypertension. However, given recent chest pain, I am referring you to cardiology for a second opinion for cause of chest pain.  I encourage you to stop smoking, increase physical activity and take blood pressure medication as prescribed.   I will see you back in 1 month.    Nonspecific Chest Pain Chest pain can be caused by many different conditions. There is a chance that your pain could be related to something serious, such as a heart attack or a blood clot in your lungs. Chest pain can also be caused by conditions that are not life-threatening. If you have chest pain, it is very important to follow up with your doctor. Follow these instructions at home: Medicines  If you were prescribed an antibiotic medicine, take it as told by your doctor. Do not stop taking the antibiotic even if you start to feel better.  Take over-the-counter and prescription medicines only as told by your doctor. Lifestyle  Do not use any products that contain nicotine or tobacco, such as cigarettes and e-cigarettes. If you need help quitting, ask your doctor.  Do not drink alcohol.  Make lifestyle changes as told by your doctor. These may include: ? Getting regular exercise. Ask your doctor for some activities that are safe for you. ? Eating a heart-healthy diet. A diet specialist (dietitian) can help you to learn healthy  eating options. ? Staying at a healthy weight. ? Managing diabetes, if needed. ? Lowering your stress, as with deep breathing or spending time in nature. General instructions  Avoid any activities that make you feel chest pain.  If your chest pain is because of heartburn: ? Raise (elevate) the head of your bed about 6 inches (15 cm). You can do this by putting blocks under the bed legs at the head of the bed. ? Do not sleep with extra pillows under your head. That does not help heartburn.  Keep all follow-up visits as told by your doctor. This is important. This includes any further testing if your chest pain does not go away. Contact a doctor if:  Your chest pain does not go away.  You have a rash with blisters on your chest.  You have a fever.  You have chills. Get help right away if:  Your chest pain is worse.  You have a cough that gets worse, or you cough up blood.  You have very bad (severe) pain in your belly (abdomen).  You are very weak.  You pass out (faint).  You have either of these for no clear reason: ? Sudden chest discomfort. ? Sudden discomfort in your arms, back, neck, or jaw.  You have shortness of breath at any time.  You suddenly start to sweat, or your skin gets clammy.  You feel sick to your stomach (nauseous).  You throw up (vomit).  You suddenly feel light-headed or dizzy.  Your heart starts to beat fast, or it feels like it is skipping beats. These symptoms may be an emergency. Do not wait to see if the symptoms will go away. Get medical help right away. Call your local emergency services (911 in the [place] Do not drive yourself to the hospital. This information is not intended to replace advice given to you by your health care provider. Make sure you discuss any questions you have with your health care provider. Document Released: 02/15/2008 Document Revised: 05/23/2016 Document Reviewed: 05/23/2016 Elsevier Interactive Patient Education   2017 Elsevier Inc.   Hypertension Hypertension is another name for high blood pressure. High blood pressure forces your heart to work harder to pump blood. This can cause problems over time. There are two numbers in a blood pressure reading. There is a top number (systolic) over a bottom number (diastolic). It is best to have a blood pressure below 120/80. Healthy choices can help lower your blood pressure. You may need medicine to help lower your blood pressure if:  Your blood pressure cannot be lowered with healthy choices.  Your blood pressure is higher than 130/80.  Follow these instructions at home: Eating and drinking  If directed, follow the DASH eating plan. This diet includes: ? Filling half of your plate at each meal with fruits and vegetables. ? Filling one quarter of your plate at each meal with whole grains. Whole grains include whole wheat pasta, brown rice, and whole grain bread. ? Eating or drinking low-fat dairy products, such as skim milk or low-fat yogurt. ? Filling one quarter of your plate at each meal with low-fat (lean) proteins. Low-fat proteins include fish, skinless chicken, eggs, beans, and tofu. ? Avoiding fatty meat, cured and processed meat, or chicken with skin. ? Avoiding premade or processed food.  Eat less than 1,500 mg of salt (sodium) a day.  Limit alcohol use to no more than 1 drink a day for nonpregnant women and 2 drinks a day for men. One drink equals 12 oz of beer, 5 oz of wine, or 1 oz of hard liquor. Lifestyle  Work with your doctor to stay at a healthy weight or to lose weight. Ask your doctor what the best weight is for you.  Get at least 30 minutes of exercise that causes your heart to beat faster (aerobic exercise) most days of the week. This may include walking, swimming, or biking.  Get at least 30 minutes of exercise that strengthens your muscles (resistance exercise) at least 3 days a week. This may include lifting weights or  pilates.  Do not use any products that contain nicotine or tobacco. This includes cigarettes and e-cigarettes. If you need help quitting, ask your doctor.  Check your blood pressure at home as told by your doctor.  Keep all follow-up visits as told by your doctor. This is important. Medicines  Take over-the-counter and prescription medicines only as told by your doctor. Follow directions carefully.  Do not skip doses of blood pressure medicine. The medicine does not work as well if you skip doses. Skipping doses also puts you at risk for problems.  Ask your doctor about side effects or reactions to medicines that you should watch for. Contact a doctor if:  You think you are having a reaction to the medicine you are taking.  You have headaches that keep coming back (recurring).  You feel dizzy.  You have swelling in your ankles.  You have  trouble with your vision. Get help right away if:  You get a very bad headache.  You start to feel confused.  You feel weak or numb.  You feel faint.  You get very bad pain in your: ? Chest. ? Belly (abdomen).  You throw up (vomit) more than once.  You have trouble breathing. Summary  Hypertension is another name for high blood pressure.  Making healthy choices can help lower blood pressure. If your blood pressure cannot be controlled with healthy choices, you may need to take medicine. This information is not intended to replace advice given to you by your health care provider. Make sure you discuss any questions you have with your health care provider. Document Released: 02/15/2008 Document Revised: 07/27/2016 Document Reviewed: 07/27/2016 Elsevier Interactive Patient Education  [employer].

## 2018-08-16 LAB — CBC WITH DIFFERENTIAL/PLATELET
BASOS: 0 %
Basophils Absolute: 0 10*3/uL (ref 0.0–0.2)
EOS (ABSOLUTE): 0 10*3/uL (ref 0.0–0.4)
Eos: 0 %
Hematocrit: 43 % (ref 34.0–46.6)
Hemoglobin: 14.2 g/dL (ref 11.1–15.9)
IMMATURE GRANS (ABS): 0.1 10*3/uL (ref 0.0–0.1)
Immature Granulocytes: 1 %
LYMPHS: 23 %
Lymphocytes Absolute: 2.3 10*3/uL (ref 0.7–3.1)
MCH: 31.2 pg (ref 26.6–33.0)
MCHC: 33 g/dL (ref 31.5–35.7)
MCV: 95 fL (ref 79–97)
Monocytes Absolute: 0.4 10*3/uL (ref 0.1–0.9)
Monocytes: 4 %
NEUTROS ABS: 7 10*3/uL (ref 1.4–7.0)
Neutrophils: 72 %
Platelets: 228 10*3/uL (ref 150–450)
RBC: 4.55 x10E6/uL (ref 3.77–5.28)
RDW: 12.9 % (ref 12.3–15.4)
WBC: 9.9 10*3/uL (ref 3.4–10.8)

## 2018-08-16 LAB — HIV ANTIBODY (ROUTINE TESTING W REFLEX): HIV Screen 4th Generation wRfx: NONREACTIVE

## 2018-08-16 LAB — COMPREHENSIVE METABOLIC PANEL
A/G RATIO: 1.8 (ref 1.2–2.2)
ALT: 9 IU/L (ref 0–32)
AST: 13 IU/L (ref 0–40)
Albumin: 4.9 g/dL (ref 3.5–5.5)
Alkaline Phosphatase: 64 IU/L (ref 39–117)
BILIRUBIN TOTAL: 0.3 mg/dL (ref 0.0–1.2)
BUN / CREAT RATIO: 13 (ref 9–23)
BUN: 12 mg/dL (ref 6–20)
CHLORIDE: 97 mmol/L (ref 96–106)
CO2: 23 mmol/L (ref 20–29)
Calcium: 9.7 mg/dL (ref 8.7–10.2)
Creatinine, Ser: 0.92 mg/dL (ref 0.57–1.00)
GFR calc non Af Amer: 80 mL/min/{1.73_m2} (ref >59)
GFR, EST AFRICAN AMERICAN: 93 mL/min/{1.73_m2} (ref >59)
GLOBULIN, TOTAL: 2.7 g/dL (ref 1.5–4.5)
Glucose: 88 mg/dL (ref 65–99)
Potassium: 3.8 mmol/L (ref 3.5–5.2)
Sodium: 138 mmol/L (ref 134–144)
TOTAL PROTEIN: 7.6 g/dL (ref 6.0–8.5)

## 2018-08-16 LAB — THYROID PANEL WITH TSH
FREE THYROXINE INDEX: 2 (ref 1.2–4.9)
T3 UPTAKE RATIO: 27 % (ref 24–39)
T4, Total: 7.3 ug/dL (ref 4.5–12.0)
TSH: 0.846 u[IU]/mL (ref 0.450–4.500)

## 2018-08-16 LAB — LIPID PANEL
CHOL/HDL RATIO: 3.3 ratio (ref 0.0–4.4)
Cholesterol, Total: 233 mg/dL — ABNORMAL HIGH (ref 100–199)
HDL: 71 mg/dL (ref >39)
LDL Calculated: 143 mg/dL — ABNORMAL HIGH (ref 0–99)
Triglycerides: 94 mg/dL (ref 0–149)
VLDL Cholesterol Cal: 19 mg/dL (ref 5–40)

## 2018-08-16 LAB — HEMOGLOBIN A1C
Est. average glucose Bld gHb Est-mCnc: 117 mg/dL
Hgb A1c MFr Bld: 5.7 % — ABNORMAL HIGH (ref 4.8–5.6)

## 2018-08-17 LAB — GC/CHLAMYDIA PROBE AMP
Chlamydia trachomatis, NAA: NEGATIVE
Neisseria gonorrhoeae by PCR: NEGATIVE

## 2018-08-22 ENCOUNTER — Other Ambulatory Visit: Payer: Self-pay | Admitting: Family Medicine

## 2018-08-22 MED ORDER — ATORVASTATIN CALCIUM 20 MG PO TABS: 20.0000 mg | ORAL_TABLET | Freq: Every day | ORAL | 3 refills | Status: DC

## 2018-08-22 NOTE — Progress Notes (Signed)
atorvastatin sent to [facility]

## 2018-08-22 NOTE — Progress Notes (Signed)
Patient notified of results & recommendations. Expressed understanding. Is agreeable to starting statin Rx. Would like it sent to [facility].

## 2018-09-11 NOTE — Progress Notes (Deleted)
Cardiology Office Note   Date:  09/11/2018   ID:  Marissa Meyer, DOB August 20, 1981, MRN [id number]  PCP:  Scot Jun, FNP  Cardiologist:   Jenkins Rouge, MD   No chief complaint on file.     History of Present Illness: Marissa Meyer is a 37 y.o. female who presents for consultation regarding chest pain. Referred by Molli Barrows FNP History of HTN. No PCP and has been seen in ED to get refill on meds. History of headaches , dental pain. And anemia She is also a smoker Seems to get all of her care via the ER  She complained of pain with physical activity Dyspnea with exertion BP elevated during office visit 08/15/18   Past Medical History:  Diagnosis Date  . Anemia   . Chlamydia   . Ectopic pregnancy   . Fibroid   . Gonorrhea   . Hypertension   . Mental disorder   . Migraines    but not diagnosed  . Pregnancy induced hypertension   . Preterm delivery   . Trichomonas     Past Surgical History:  Procedure Laterality Date  . CESAREAN SECTION N/A 03/21/2014   Procedure: CESAREAN SECTION;  Surgeon: Osborne Oman, MD;  Location: [facility];  Service: Obstetrics;  Laterality: N/A;  . DILATION AND CURETTAGE OF UTERUS    . ECTOPIC PREGNANCY SURGERY       Current Outpatient Medications  Medication Sig Dispense Refill  . acetaminophen (TYLENOL) 500 MG tablet Take 1 tablet (500 mg total) by mouth every 6 (six) hours as needed. 30 tablet 0  . amLODipine (NORVASC) 5 MG tablet Take 1 tablet (5 mg total) by mouth daily. 30 tablet 5  . atorvastatin (LIPITOR) 20 MG tablet Take 1 tablet (20 mg total) by mouth daily. 90 tablet 3  . clindamycin (CLEOCIN) 300 MG capsule Take 1 capsule (300 mg total) by mouth 3 (three) times daily. 30 capsule 0  . hydrochlorothiazide (HYDRODIURIL) 25 MG tablet Take 1 tablet (25 mg total) by mouth daily. 30 tablet 5  . meloxicam (MOBIC) 15 MG tablet Take 1 tablet (15 mg total) by mouth daily. 30 tablet 0   No current facility-administered  medications for this visit.     Allergies:   Amoxicillin    Social History:  The patient  reports that she has been smoking cigars. She has been smoking about 0.25 packs per day. She has never used smokeless tobacco. She reports current drug use. Drug: Marijuana. She reports that she does not drink alcohol.   Family History:  The patient's family history includes Cancer in her maternal grandfather; Hypertension in her maternal grandmother and mother.    ROS:  Please see the history of present illness.   Otherwise, review of systems are positive for none.   All other systems are reviewed and negative.    PHYSICAL EXAM: VS:  There were no vitals taken for this visit. , BMI There is no height or weight on file to calculate BMI. Affect appropriate Healthy:  appears stated age 49: normal Neck supple with no adenopathy JVP normal no bruits no thyromegaly Lungs clear with no wheezing and good diaphragmatic motion Heart:  S1/S2 no murmur, no rub, gallop or click PMI normal Abdomen: benighn, BS positve, no tenderness, no AAA no bruit.  No HSM or HJR Distal pulses intact with no bruits No edema Neuro non-focal Skin warm and dry No muscular weakness    EKG:  SR LAE  otherwise normal 08/15/18   Recent Labs: 08/15/2018: ALT 9; BUN 12; Creatinine, Ser 0.92; Hemoglobin 14.2; Platelets 228; Potassium 3.8; Sodium 138; TSH 0.846    Lipid Panel    Component Value Date/Time   CHOL 233 (H) 08/15/2018 1008   TRIG 94 08/15/2018 1008   HDL 71 08/15/2018 1008   CHOLHDL 3.3 08/15/2018 1008   LDLCALC 143 (H) 08/15/2018 1008      Wt Readings from Last 3 Encounters:  08/15/18 97 lb 6.4 oz (44.2 kg)  08/06/18 89 lb (40.4 kg)  07/10/18 140 lb (63.5 kg)      Other studies Reviewed: Additional studies/ records that were reviewed today include: Notes from ER October and November Notes from primary December with ECG and labs .    ASSESSMENT AND PLAN:  1.  Chest Pain:  *** 2.  HTN:   ***   Current medicines are reviewed at length with the patient today.  The patient does not have concerns regarding medicines.  The following changes have been made:  ***  Labs/ tests ordered today include: *** No orders of the defined types were placed in this encounter.    Disposition:   FU with cardiology PRN      Signed, Jenkins Rouge, MD  09/11/2018 2:07 PM    [address], [place], [place]  [address] Phone: [phone]; Fax: [phone]

## 2018-09-17 ENCOUNTER — Ambulatory Visit: Payer: Self-pay | Admitting: Family Medicine

## 2018-09-17 ENCOUNTER — Ambulatory Visit: Payer: Self-pay | Admitting: Cardiovascular Disease

## 2018-09-18 ENCOUNTER — Encounter: Payer: Self-pay | Admitting: Cardiovascular Disease

## 2018-10-04 ENCOUNTER — Ambulatory Visit: Payer: Self-pay | Admitting: Family Medicine

## 2018-11-21 ENCOUNTER — Emergency Department (HOSPITAL_COMMUNITY)
Admission: EM | Admit: 2018-11-21 | Discharge: 2018-11-21 | Disposition: A | Discharge status: Home / Self Care | Payer: Self-pay | Attending: Emergency Medicine | Admitting: Emergency Medicine

## 2018-11-21 ENCOUNTER — Other Ambulatory Visit: Payer: Self-pay

## 2018-11-21 ENCOUNTER — Emergency Department (HOSPITAL_COMMUNITY): Payer: Self-pay

## 2018-11-21 DIAGNOSIS — R51 Headache: Secondary | ICD-10-CM | POA: Insufficient documentation

## 2018-11-21 DIAGNOSIS — R0981 Nasal congestion: Secondary | ICD-10-CM | POA: Insufficient documentation

## 2018-11-21 DIAGNOSIS — R05 Cough: Secondary | ICD-10-CM | POA: Insufficient documentation

## 2018-11-21 DIAGNOSIS — K0889 Other specified disorders of teeth and supporting structures: Secondary | ICD-10-CM | POA: Insufficient documentation

## 2018-11-21 DIAGNOSIS — F1210 Cannabis abuse, uncomplicated: Secondary | ICD-10-CM | POA: Insufficient documentation

## 2018-11-21 DIAGNOSIS — R0789 Other chest pain: Secondary | ICD-10-CM | POA: Insufficient documentation

## 2018-11-21 DIAGNOSIS — R509 Fever, unspecified: Secondary | ICD-10-CM | POA: Insufficient documentation

## 2018-11-21 DIAGNOSIS — F17290 Nicotine dependence, other tobacco product, uncomplicated: Secondary | ICD-10-CM | POA: Insufficient documentation

## 2018-11-21 DIAGNOSIS — I10 Essential (primary) hypertension: Secondary | ICD-10-CM | POA: Insufficient documentation

## 2018-11-21 DIAGNOSIS — Z79899 Other long term (current) drug therapy: Secondary | ICD-10-CM | POA: Insufficient documentation

## 2018-11-21 LAB — CBC
HEMATOCRIT: 34.2 % — AB (ref 36.0–46.0)
HEMOGLOBIN: 10.8 g/dL — AB (ref 12.0–15.0)
MCH: 30.8 pg (ref 26.0–34.0)
MCHC: 31.6 g/dL (ref 30.0–36.0)
MCV: 97.4 fL (ref 80.0–100.0)
NRBC: 0 % (ref 0.0–0.2)
Platelets: 236 10*3/uL (ref 150–400)
RBC: 3.51 MIL/uL — ABNORMAL LOW (ref 3.87–5.11)
RDW: 12.3 % (ref 11.5–15.5)
WBC: 7 10*3/uL (ref 4.0–10.5)

## 2018-11-21 LAB — BASIC METABOLIC PANEL
Anion gap: 4 — ABNORMAL LOW (ref 5–15)
BUN: 8 mg/dL (ref 6–20)
CHLORIDE: 108 mmol/L (ref 98–111)
CO2: 26 mmol/L (ref 22–32)
CREATININE: 0.71 mg/dL (ref 0.44–1.00)
Calcium: 8.3 mg/dL — ABNORMAL LOW (ref 8.9–10.3)
GFR calc Af Amer: >60 mL/min (ref >60)
GFR calc non Af Amer: >60 mL/min (ref >60)
Glucose, Bld: 88 mg/dL (ref 70–99)
POTASSIUM: 3.2 mmol/L — AB (ref 3.5–5.1)
SODIUM: 138 mmol/L (ref 135–145)

## 2018-11-21 LAB — I-STAT TROPONIN, ED: Troponin i, poc: 0 ng/mL (ref 0.00–0.08)

## 2018-11-21 LAB — I-STAT BETA HCG BLOOD, ED (MC, WL, AP ONLY)

## 2018-11-21 MED ORDER — CLINDAMYCIN HCL 150 MG PO CAPS: 150.0000 mg | ORAL_CAPSULE | Freq: Four times a day (QID) | ORAL | 0 refills | Status: DC

## 2018-11-21 NOTE — Discharge Instructions (Signed)
As discussed earlier please call the smoking cessation hotline if you would like help quitting.  I put an order into the case manager to try and help you with your medications.  Please return to the ED for fever worsening facial swelling difficulty swallowing.  Return for coughing up blood or if you feel you are going to pass out.

## 2018-11-21 NOTE — ED Provider Notes (Addendum)
[facility] EMERGENCY DEPARTMENT Provider Note   CSN: [id number] Arrival date & time: 11/21/18  2021    History   Chief Complaint Chief Complaint  Patient presents with  . Chest Pain    HPI Marissa Meyer is a 38 y.o. female.     40 yoF with a chief complaint of chest pain.  This been going on for the past week.  Patient was preceded by upper respiratory illness that she describes as the flu.  Having cough and congestion fevers which is improved.  Now pain is worse with a deep breath.  Described as sharp denies radiation.  Has some left lower dental pain which is chronic but is gotten worse over the past few days.  Also having headaches that she refers to as her migraines.  Describes it as bilateral eyes.  She has a history of hypertension hyperlipidemia smoking, denies family history denies diabetes.  Denies history of PE or DVT denies hemoptysis denies unilateral lower extremity edema denies recent surgery or immobilization denies estrogen use denies history of cancer.  The history is provided by the patient.  Chest Pain  Pain location:  Substernal area Pain quality: sharp and shooting   Pain radiates to:  Does not radiate Pain severity:  Moderate Onset quality:  Sudden Duration:  1 week Timing:  Intermittent Progression:  Worsening Chronicity:  New Relieved by:  Nothing Worsened by:  Deep breathing, movement, certain positions and coughing Ineffective treatments:  None tried Associated symptoms: no dizziness, no fever, no headache, no nausea, no palpitations, no shortness of breath and no vomiting     Past Medical History:  Diagnosis Date  . Anemia   . Chlamydia   . Ectopic pregnancy   . Fibroid   . Gonorrhea   . Hypertension   . Mental disorder   . Migraines    but not diagnosed  . Pregnancy induced hypertension   . Preterm delivery   . Trichomonas     Patient Active Problem List   Diagnosis Date Noted  . Gestational hypertension  09/19/2015  . Chronic hypertension with exacerbation during pregnancy 09/19/2015  . No prenatal care in current pregnancy in third trimester 09/19/2015  . Pregnancy with adoption planned, currently in third trimester 09/19/2015  . Cocaine abuse affecting pregnancy in third trimester ([place]) 03/14/2014  . Tobacco use in pregnancy, antepartum 02/20/2014    Past Surgical History:  Procedure Laterality Date  . CESAREAN SECTION N/A 03/21/2014   Procedure: CESAREAN SECTION;  Surgeon: Osborne Oman, MD;  Location: [facility];  Service: Obstetrics;  Laterality: N/A;  . DILATION AND CURETTAGE OF UTERUS    . ECTOPIC PREGNANCY SURGERY       OB History    Gravida  5   Para  4   Term  0   Preterm  4   AB  1   Living  3     SAB      TAB      Ectopic  1   Multiple  0   Live Births  3            Home Medications    Prior to Admission medications   Medication Sig Start Date End Date Taking? Authorizing Provider  acetaminophen (TYLENOL) 500 MG tablet Take 1 tablet (500 mg total) by mouth every 6 (six) hours as needed. 08/06/18   Ashley Murrain, NP  amLODipine (NORVASC) 5 MG tablet Take 1 tablet (5 mg total) by mouth  daily. 08/15/18   Scot Jun, FNP  atorvastatin (LIPITOR) 20 MG tablet Take 1 tablet (20 mg total) by mouth daily. 08/22/18   Scot Jun, FNP  clindamycin (CLEOCIN) 150 MG capsule Take 1 capsule (150 mg total) by mouth every 6 (six) hours. 11/21/18   Deno Etienne, DO  hydrochlorothiazide (HYDRODIURIL) 25 MG tablet Take 1 tablet (25 mg total) by mouth daily. 08/15/18   Scot Jun, FNP  meloxicam (MOBIC) 15 MG tablet Take 1 tablet (15 mg total) by mouth daily. 08/15/18   Scot Jun, FNP    Family History Family History  Problem Relation Age of Onset  . Hypertension Mother   . Hypertension Maternal Grandmother   . Cancer Maternal Grandfather     Social History Social History   Tobacco Use  . Smoking status: Current Every Day Smoker     Packs/day: 0.25    Types: Cigars    Last attempt to quit: 08/13/2013    Years since quitting: 5.2  . Smokeless tobacco: Never Used  Substance Use Topics  . Alcohol use: No    Frequency: Never  . Drug use: Yes    Types: Marijuana     Allergies   Amoxicillin   Review of Systems Review of Systems  Constitutional: Negative for chills and fever.  HENT: Negative for congestion and rhinorrhea.   Eyes: Negative for redness and visual disturbance.  Respiratory: Negative for shortness of breath and wheezing.   Cardiovascular: Positive for chest pain. Negative for palpitations.  Gastrointestinal: Negative for nausea and vomiting.  Genitourinary: Negative for dysuria and urgency.  Musculoskeletal: Negative for arthralgias and myalgias.  Skin: Negative for pallor and wound.  Neurological: Negative for dizziness and headaches.     Physical Exam Updated Vital Signs BP 130/72   Pulse 77   Temp 98.3 F (36.8 C) (Oral)   Resp 15   Ht 4\' 9"  (1.448 m)   Wt 45.4 kg   LMP 11/13/2018 Comment: pt unsure of pregnancy even with recent period - double shielded pt.  SpO2 100%   BMI 21.64 kg/m   Physical Exam Vitals signs and nursing note reviewed.  Constitutional:      General: She is not in acute distress.    Appearance: She is well-developed. She is not diaphoretic.  HENT:     Head: Normocephalic and atraumatic.     Comments: General poor dentition, fractured tooth to the left posterior molar mildly tender to percussion.  No noted erythema or edema.  No sublingual swelling. Eyes:     Pupils: Pupils are equal, round, and reactive to light.  Neck:     Musculoskeletal: Normal range of motion and neck supple.  Cardiovascular:     Rate and Rhythm: Normal rate and regular rhythm.     Heart sounds: No murmur. No friction rub. No gallop.   Pulmonary:     Effort: Pulmonary effort is normal.     Breath sounds: No wheezing or rales.  Chest:     Chest wall: Tenderness (about the sternum,  reproduces pain) present.  Abdominal:     General: There is no distension.     Palpations: Abdomen is soft.     Tenderness: There is no abdominal tenderness.  Musculoskeletal:        General: No tenderness.  Skin:    General: Skin is warm and dry.  Neurological:     Mental Status: She is alert and oriented to person, place, and time.  Psychiatric:  Behavior: Behavior normal.      ED Treatments / Results  Labs (all labs ordered are listed, but only abnormal results are displayed) Labs Reviewed  BASIC METABOLIC PANEL - Abnormal; Notable for the following components:      Result Value   Potassium 3.2 (*)    Calcium 8.3 (*)    Anion gap 4 (*)    All other components within normal limits  CBC - Abnormal; Notable for the following components:   RBC 3.51 (*)    Hemoglobin 10.8 (*)    HCT 34.2 (*)    All other components within normal limits  I-STAT TROPONIN, ED  I-STAT BETA HCG BLOOD, ED ([facility], [facility], [facility] ONLY)    EKG EKG Interpretation  Date/Time:  Wednesday November 21 2018 20:33:01 EDT Ventricular Rate:  83 PR Interval:    QRS Duration: 80 QT Interval:  397 QTC Calculation: 467 R Axis:   59 Text Interpretation:  Sinus rhythm No old tracing to compare Confirmed by Deno Etienne [phone]) on 11/21/2018 9:08:03 PM   Radiology Dg Chest 2 View  Result Date: 11/21/2018 CLINICAL DATA:  Mid chest pain for 2 weeks. Lightheaded and short of breath with a little cough. Current smoker. EXAM: CHEST - 2 VIEW COMPARISON:  11/13/2017 FINDINGS: The heart size and mediastinal contours are within normal limits. Both lungs are clear. The visualized skeletal structures are unremarkable. IMPRESSION: No active cardiopulmonary disease. Electronically Signed   By: Lucienne Capers M.D.   On: 11/21/2018 22:00    Procedures Procedures (including critical care time) Discussed smoking cessation with patient and was they were offerred resources to help stop.  Total time was 5 min CPT code 99406.    Medications Ordered in ED Medications - No data to display   Initial Impression / Assessment and Plan / ED Course  I have reviewed the triage vital signs and the nursing notes.  Pertinent labs & imaging results that were available during my care of the patient were reviewed by me and considered in my medical decision making (see chart for details).        38 yo F with a chief complaint of atypical chest pain.  Reproducible on exam.  Completely atypical of ACS.  Patient is a 26 has a significant number of risk factors for ACS already.  Including tobacco and cocaine abuse.  Counseled her on trying to stop these.  We will have case management contact her as she is been having trouble affording her cholesterol and hypertensive medications.  Work-up here is unremarkable, troponin is negative EKG unremarkable patient is not pregnant chest x-ray viewed by me without focal infiltrate or pneumothorax.  11:41 PM:  I have discussed the diagnosis/risks/treatment options with the patient and family and believe the pt to be eligible for discharge home to follow-up with PCP. We also discussed returning to the ED immediately if new or worsening sx occur. We discussed the sx which are most concerning (e.g., sudden worsening pain, fever, inability to tolerate by mouth) that necessitate immediate return. Medications administered to the patient during their visit and any new prescriptions provided to the patient are listed below.  Medications given during this visit Medications - No data to display   The patient appears reasonably screen and/or stabilized for discharge and I doubt any other medical condition or other [facility] requiring further screening, evaluation, or treatment in the ED at this time prior to discharge.    Final Clinical Impressions(s) / ED Diagnoses   Final  diagnoses:  Atypical chest pain    ED Discharge Orders         Ordered    clindamycin (CLEOCIN) 150 MG capsule  Every 6 hours      11/21/18 2228           Deno Etienne, DO 11/21/18 [place], [place], DO 11/21/18 2341

## 2018-11-21 NOTE — ED Triage Notes (Signed)
Pt brought in by EMS with c/;o chest pain 5/10 and pressure that started at approx 1500 after the pt woke up from a nap. Pt has a hx of an enlarged heart valve and HTN. Pt states that she has not been taking her HTN medication for around 1 month due to cost. Pt had the flu approx 1 week ago with productive cough. Pt given ASA and 2 nitro prior to arrival. CP increases with deep breathing. Pt denies N/V/D. Vitals prior to arrival: 152/104, HR 89, RR 20, SpO2 99% RA. Pt states pain is now a 3/10.

## 2018-12-24 ENCOUNTER — Emergency Department (HOSPITAL_COMMUNITY): Payer: Self-pay

## 2018-12-24 ENCOUNTER — Other Ambulatory Visit: Payer: Self-pay

## 2018-12-24 ENCOUNTER — Emergency Department (HOSPITAL_COMMUNITY)
Admission: EM | Admit: 2018-12-24 | Discharge: 2018-12-24 | Disposition: A | Discharge status: Home / Self Care | Payer: Self-pay | Attending: Emergency Medicine | Admitting: Emergency Medicine

## 2018-12-24 ENCOUNTER — Encounter (HOSPITAL_COMMUNITY): Payer: Self-pay | Admitting: Emergency Medicine

## 2018-12-24 DIAGNOSIS — R197 Diarrhea, unspecified: Secondary | ICD-10-CM | POA: Insufficient documentation

## 2018-12-24 DIAGNOSIS — A084 Viral intestinal infection, unspecified: Secondary | ICD-10-CM

## 2018-12-24 DIAGNOSIS — Z79899 Other long term (current) drug therapy: Secondary | ICD-10-CM | POA: Insufficient documentation

## 2018-12-24 DIAGNOSIS — R112 Nausea with vomiting, unspecified: Secondary | ICD-10-CM | POA: Insufficient documentation

## 2018-12-24 DIAGNOSIS — I10 Essential (primary) hypertension: Secondary | ICD-10-CM | POA: Insufficient documentation

## 2018-12-24 DIAGNOSIS — F17290 Nicotine dependence, other tobacco product, uncomplicated: Secondary | ICD-10-CM | POA: Insufficient documentation

## 2018-12-24 DIAGNOSIS — F1290 Cannabis use, unspecified, uncomplicated: Secondary | ICD-10-CM | POA: Insufficient documentation

## 2018-12-24 LAB — COMPREHENSIVE METABOLIC PANEL
ALT: 14 U/L (ref 0–44)
AST: 23 U/L (ref 15–41)
Albumin: 4.5 g/dL (ref 3.5–5.0)
Alkaline Phosphatase: 54 U/L (ref 38–126)
Anion gap: 12 (ref 5–15)
BUN: 13 mg/dL (ref 6–20)
CO2: 20 mmol/L — ABNORMAL LOW (ref 22–32)
Calcium: 9.7 mg/dL (ref 8.9–10.3)
Chloride: 104 mmol/L (ref 98–111)
Creatinine, Ser: 0.98 mg/dL (ref 0.44–1.00)
GFR calc Af Amer: >60 mL/min (ref >60)
GFR calc non Af Amer: >60 mL/min (ref >60)
Glucose, Bld: 127 mg/dL — ABNORMAL HIGH (ref 70–99)
Potassium: 3.8 mmol/L (ref 3.5–5.1)
Sodium: 136 mmol/L (ref 135–145)
Total Bilirubin: 0.6 mg/dL (ref 0.3–1.2)
Total Protein: 7.7 g/dL (ref 6.5–8.1)

## 2018-12-24 LAB — CBC WITH DIFFERENTIAL/PLATELET
Abs Immature Granulocytes: 0.04 10*3/uL (ref 0.00–0.07)
Basophils Absolute: 0 10*3/uL (ref 0.0–0.1)
Basophils Relative: 0 %
Eosinophils Absolute: 0 10*3/uL (ref 0.0–0.5)
Eosinophils Relative: 0 %
HCT: 42.7 % (ref 36.0–46.0)
Hemoglobin: 14.2 g/dL (ref 12.0–15.0)
Immature Granulocytes: 0 %
Lymphocytes Relative: 12 %
Lymphs Abs: 1.6 10*3/uL (ref 0.7–4.0)
MCH: 32.2 pg (ref 26.0–34.0)
MCHC: 33.3 g/dL (ref 30.0–36.0)
MCV: 96.8 fL (ref 80.0–100.0)
Monocytes Absolute: 0.4 10*3/uL (ref 0.1–1.0)
Monocytes Relative: 3 %
Neutro Abs: 11 10*3/uL — ABNORMAL HIGH (ref 1.7–7.7)
Neutrophils Relative %: 85 %
Platelets: 232 10*3/uL (ref 150–400)
RBC: 4.41 MIL/uL (ref 3.87–5.11)
RDW: 12.5 % (ref 11.5–15.5)
WBC: 13 10*3/uL — ABNORMAL HIGH (ref 4.0–10.5)
nRBC: 0 % (ref 0.0–0.2)

## 2018-12-24 LAB — URINALYSIS, ROUTINE W REFLEX MICROSCOPIC
Bilirubin Urine: NEGATIVE
Glucose, UA: 50 mg/dL — AB
Ketones, ur: 5 mg/dL — AB
Nitrite: NEGATIVE
Protein, ur: NEGATIVE mg/dL
Specific Gravity, Urine: 1.02 (ref 1.005–1.030)
pH: 5 (ref 5.0–8.0)

## 2018-12-24 LAB — I-STAT BETA HCG BLOOD, ED (MC, WL, AP ONLY): I-stat hCG, quantitative: <5 m[IU]/mL (ref <5)

## 2018-12-24 LAB — LIPASE, BLOOD: Lipase: 37 U/L (ref 11–51)

## 2018-12-24 MED ORDER — ONDANSETRON HCL 4 MG/2ML IJ SOLN
4.0000 mg | Freq: Once | INTRAMUSCULAR | Status: AC
  Administered 2018-12-24: 4 mg via INTRAVENOUS
  Filled 2018-12-24: qty 2

## 2018-12-24 MED ORDER — MORPHINE SULFATE (PF) 4 MG/ML IV SOLN
4.0000 mg | Freq: Once | INTRAVENOUS | Status: AC
  Administered 2018-12-24: 4 mg via INTRAVENOUS
  Filled 2018-12-24: qty 1

## 2018-12-24 MED ORDER — ONDANSETRON 4 MG PO TBDP: 4.0000 mg | ORAL_TABLET | Freq: Three times a day (TID) | ORAL | 0 refills | Status: DC | PRN

## 2018-12-24 MED ORDER — SODIUM CHLORIDE 0.9 % IV BOLUS
1000.0000 mL | Freq: Once | INTRAVENOUS | Status: AC
  Administered 2018-12-24: 13:00:00 1000 mL via INTRAVENOUS

## 2018-12-24 MED ORDER — IOHEXOL 300 MG/ML  SOLN
100.0000 mL | Freq: Once | INTRAMUSCULAR | Status: AC | PRN
  Administered 2018-12-24: 100 mL via INTRAVENOUS

## 2018-12-24 NOTE — ED Notes (Signed)
Pt back from CT

## 2018-12-24 NOTE — ED Provider Notes (Signed)
[facility] DEPARTMENT Provider Note   CSN: [id number] Arrival date & time: 12/24/18  1237    History   Chief Complaint Chief Complaint  Patient presents with   Abdominal Pain    HPI Marissa Meyer is a 38 y.o. female presenting today for left upper quadrant abdominal pain, nausea/vomiting/diarrhea.  Patient reports that she woke up at 6 AM feeling "queasy" and she returned to sleep.  Patient reports that shortly after getting out of bed at 9 AM she began having left upper quadrant abdominal pain followed by nausea vomiting and diarrhea.  Patient describes nonbloody nonbilious vomiting and nonbloody diarrhea approximately 9 episodes since 9 AM this morning.  She describes abdominal pain as a waxing and waning aching pain constant worsened with palpation and vomiting and without alleviating factors.  She denies similar symptoms in the past.  She denies fever/chills, chest pain/shortness of breath, cough, dysuria/hematuria, vaginal bleeding/discharge or any additional concerns.  Of note patient reports that she ate a large amount of food at [employer] dinner last night with her family, she denies any other family members being sick with similar symptoms.    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 Authorizing Provider  acetaminophen (TYLENOL) 500 MG tablet Take 1 tablet (500 mg total) by mouth every 6 (six) hours as needed. 08/06/18  Yes Neese, [place], NP  amLODipine (NORVASC) 5 MG tablet Take 1 tablet (5 mg total) by mouth daily. Patient not taking: Reported on 12/24/2018 08/15/18   Scot Jun, FNP  atorvastatin (LIPITOR) 20 MG tablet Take 1 tablet (20 mg total) by mouth daily. Patient not taking: Reported on 12/24/2018 08/22/18   Scot Jun, FNP  clindamycin (CLEOCIN) 150 MG capsule Take 1 capsule (150 mg total) by mouth every 6 (six) hours. Patient not taking: Reported on 12/24/2018 11/21/18   Deno Etienne, DO  hydrochlorothiazide (HYDRODIURIL) 25 MG  tablet Take 1 tablet (25 mg total) by mouth daily. Patient not taking: Reported on 12/24/2018 08/15/18   Scot Jun, FNP  meloxicam (MOBIC) 15 MG tablet Take 1 tablet (15 mg total) by mouth daily. Patient not taking: Reported on 12/24/2018 08/15/18   Scot Jun, FNP  ondansetron (ZOFRAN ODT) 4 MG disintegrating tablet Take 1 tablet (4 mg total) by mouth every 8 (eight) hours as needed for nausea or vomiting. 12/24/18   Deliah Boston, PA-C    Family History Family History  Problem Relation Age of Onset   Hypertension Mother    Hypertension Maternal Grandmother    Cancer Maternal Grandfather     Social History Social History   Tobacco Use   Smoking  status: Current Every Day Smoker    Packs/day: 0.25    Types: Cigars    Last attempt to quit: 08/13/2013    Years since quitting: 5.3   Smokeless tobacco: Never Used  Substance Use Topics   Alcohol use: No    Frequency: Never   Drug use: Yes    Types: Marijuana     Allergies   Amoxicillin   Review of Systems Review of Systems  Constitutional: Negative.  Negative for chills and fever.  Eyes: Negative.  Negative for visual disturbance.  Respiratory: Negative.  Negative for cough and shortness of breath.   Cardiovascular: Negative.  Negative for chest pain.  Gastrointestinal: Positive for abdominal pain, diarrhea, nausea and vomiting. Negative for blood in stool.  Genitourinary: Negative.  Negative for dysuria, hematuria, vaginal bleeding and vaginal discharge.  Neurological: Negative.  Negative for weakness and headaches.  All other systems reviewed and are negative.  Physical Exam Updated Vital Signs BP (!) 148/93    Pulse 72    Temp 98.2 F (36.8 C) (Oral)    Resp 18    Ht 4\' 9"  (1.448 m)    Wt 46.3 kg    LMP 12/12/2018    SpO2 100%    BMI 22.07 kg/m   Physical Exam Constitutional:      General: She is not in acute distress.    Appearance: Normal appearance. She is well-developed. She is not ill-appearing or diaphoretic.  HENT:     Head: Normocephalic and atraumatic.     Right Ear: External ear normal.     Left Ear: External ear normal.     Nose: Nose normal.     Mouth/Throat:     Mouth: Mucous membranes are moist.     Pharynx: Oropharynx is clear.  Eyes:     General: Vision grossly intact. Gaze aligned appropriately.     Extraocular Movements: Extraocular movements intact.     Pupils: Pupils are equal, round, and reactive to light.  Neck:     Musculoskeletal: Normal range of motion and neck supple.     Trachea: Trachea and phonation normal. No tracheal tenderness or tracheal deviation.  Cardiovascular:     Rate and Rhythm: Normal rate and regular rhythm.      Pulses:          Dorsalis pedis pulses are 2+ on the right side and 2+ on the left side.       Posterior tibial pulses are 2+ on the right side and 2+ on the left side.     Heart sounds: Normal heart sounds.  Pulmonary:     Effort: Pulmonary effort is normal. No respiratory distress.     Breath sounds: Normal breath sounds and air entry.  Abdominal:  General: There is no distension.     Palpations: Abdomen is soft.     Tenderness: There is abdominal tenderness in the left upper quadrant. There is no right CVA tenderness, left CVA tenderness, guarding or rebound. Negative signs include Murphy's sign, Rovsing's sign and McBurney's sign.    Genitourinary:    Comments: Deferred by patient Musculoskeletal: Normal range of motion.     Right lower leg: Normal. She exhibits no tenderness. No edema.     Left lower leg: Normal. She exhibits no tenderness. No edema.  Feet:     Right foot:     Protective Sensation: 3 sites tested. 3 sites sensed.     Left foot:     Protective Sensation: 3 sites tested. 3 sites sensed.  Skin:    General: Skin is warm and dry.  Neurological:     Mental Status: She is alert.     GCS: GCS eye subscore is 4. GCS verbal subscore is 5. GCS motor subscore is 6.     Comments: Speech is clear and goal oriented, follows commands Major Cranial nerves without deficit, no facial droop Moves extremities without ataxia, coordination intact  Psychiatric:        Behavior: Behavior normal.    ED Treatments / Results  Labs (all labs ordered are listed, but only abnormal results are displayed) Labs Reviewed  CBC WITH DIFFERENTIAL/PLATELET - Abnormal; Notable for the following components:      Result Value   WBC 13.0 (*)    Neutro Abs 11.0 (*)    All other components within normal limits  COMPREHENSIVE METABOLIC PANEL - Abnormal; Notable for the following components:   CO2 20 (*)    Glucose, Bld 127 (*)    All other components within normal limits  URINALYSIS,  ROUTINE W REFLEX MICROSCOPIC - Abnormal; Notable for the following components:   Glucose, UA 50 (*)    Hgb urine dipstick SMALL (*)    Ketones, ur 5 (*)    Leukocytes,Ua TRACE (*)    Bacteria, UA RARE (*)    All other components within normal limits  LIPASE, BLOOD  I-STAT BETA HCG BLOOD, ED ([facility], [facility], [facility] ONLY)    EKG None  Radiology Ct Abdomen Pelvis W Contrast  Result Date: 12/24/2018 CLINICAL DATA:  Left upper quadrant abdominal pain, nausea and vomiting. EXAM: CT ABDOMEN AND PELVIS WITH CONTRAST TECHNIQUE: Multidetector CT imaging of the abdomen and pelvis was performed using the standard protocol following bolus administration of intravenous contrast. CONTRAST:  124mL OMNIPAQUE IOHEXOL 300 MG/ML  SOLN COMPARISON:  None. FINDINGS: Lower chest: No acute abnormality. Hepatobiliary: No focal liver abnormality is seen. No gallstones, gallbladder wall thickening, or biliary dilatation. Pancreas: Unremarkable. No pancreatic ductal dilatation or surrounding inflammatory changes. Spleen: Normal in size without focal abnormality. Adrenals/Urinary Tract: Adrenal glands are unremarkable. Kidneys are normal, without renal calculi, focal lesion, or hydronephrosis. Bladder is unremarkable. Stomach/Bowel: There are multiple nondilated loops of mid to distal small bowel containing fluid which may be consistent with enteritis. No evidence of bowel obstruction, free air or bowel lesion. The appendix is normal. No focal abscess identified. Vascular/Lymphatic: No significant vascular findings are present. No enlarged abdominal or pelvic lymph nodes. Reproductive: Uterus and bilateral adnexa are unremarkable. Other: No abdominal wall hernia or abnormality. No abdominopelvic ascites. Musculoskeletal: No acute or significant osseous findings. IMPRESSION: Nondilated loops of fluid-filled small bowel are suggestive of enteritis. Electronically Signed   By: Aletta Edouard M.D.   On: 12/24/2018 16:13  Procedures Procedures (including critical care time)  Medications Ordered in ED Medications  ondansetron (ZOFRAN) injection 4 mg (4 mg Intravenous Given 12/24/18 1246)  morphine 4 MG/ML injection 4 mg (4 mg Intravenous Given 12/24/18 1312)  sodium chloride 0.9 % bolus 1,000 mL (0 mLs Intravenous Stopped 12/24/18 1410)  iohexol (OMNIPAQUE) 300 MG/ML solution 100 mL (100 mLs Intravenous Contrast Given 12/24/18 1532)     Initial Impression / Assessment and Plan / ED Course  I have reviewed the triage vital signs and the nursing notes.  Pertinent labs & imaging results that were available during my care of the patient were reviewed by me and considered in my medical decision making (see chart for details).    Beta-hCG negative Lipase within normal limits CMP with elevated glucose CBC with leukocytosis of 13.0 Urinalysis with glucose present, rare bacteria and trace leukocytes, patient without urinary symptoms doubt UTI at this time Pelvic examination deferred by patient, reports not sexually active and denies new vaginal discharge or bleeding, reasonable for patient to defer examination at this time. CT abd/pelvis:  IMPRESSION:  Nondilated loops of fluid-filled small bowel are suggestive of  enteritis.  ------------- Fluid bolus given, 4 mg morphine given, 4 mg Zofran given.  Patient updated on findings today.  Reexamination of the abdomen reveals a soft minimally tender in the left upper quadrant abdomen without peritoneal signs.  She has been eating and drinking here in the emergency department for the last 2 hours without recurrent nausea vomiting and states great improvement with all of her symptoms.  She is requesting discharge at this time.  Discussed with patient that likely viral gastroenteritis as etiology of symptoms today, low suspicion for bacterial cause of her enteritis.  Discussed increasing fluid intake to avoid dehydration.  ODT Zofran prescribed for nausea vomiting.   Encouraged rest and water hydration.   Additionally I informed patient of her elevated glucose levels and glucose in her urine and I encouraged her to follow-up with her primary care provider for evaluation of this as could be early diabetes.  Patient states that she has a history of prediabetes and is already aware.  Additionally patient had to be hypertensive on arrival she states that she has history of hypertension and has not taken her medications that she is asymptomatic regarding her hypertension today, discussed the signs and symptoms of hypertension urgency/emergency with the patient to return to the emerge department if these occur.  Encouraged patient to follow-up with her primary care provider this week for blood pressure recheck and medication management.  At this time there does not appear to be any evidence of an acute emergency medical condition and the patient appears stable for discharge with appropriate outpatient follow up. Diagnosis was discussed with patient who verbalizes understanding of care plan and is agreeable to discharge. I have discussed return precautions with patient and who verbalizes understanding of return precautions. Patient strongly encouraged to follow-up with their PCP. All questions answered.  Patient has been discharged in good condition.  Patient's case discussed with Dr. Vanita Panda who agrees with plan to discharge with follow-up.   Note: Portions of this report may have been transcribed using voice recognition software. Every effort was made to ensure accuracy; however, inadvertent computerized transcription errors may still be present. Final Clinical Impressions(s) / ED Diagnoses   Final diagnoses:  Viral gastroenteritis    ED Discharge Orders         Ordered    ondansetron (ZOFRAN ODT) 4 MG disintegrating tablet  Every 8 hours PRN     12/24/18 1645           Gari Crown 12/24/18 1739    Carmin Muskrat, MD 12/25/18 0005

## 2018-12-24 NOTE — ED Notes (Signed)
Patient transported to CT 

## 2018-12-24 NOTE — ED Triage Notes (Signed)
Pt reports abd pain, N/V/D that started this AM. Endorses 9 episodes. Pt actively vomiting.

## 2018-12-24 NOTE — Discharge Instructions (Addendum)
You have been diagnosed today with viral gastroenteritis.  At this time there does not appear to be the presence of an emergent medical condition, however there is always the potential for conditions to change. Please read and follow the below instructions.  Please return to the Emergency Department immediately for any new or worsening symptoms or if your symptoms do not improve within 2 days. Please be sure to follow up with your Primary Care Provider within one week regarding your visit today; please call their office to schedule an appointment even if you are feeling better for a follow-up visit. The cause of your symptoms today is likely a viral GI bug causing your nausea vomiting diarrhea and abdominal pain.  This should improve over the next few days with increasing her water intake and rest.  You may use the Zofran prescribed to you today to help with your nausea and vomiting. Additionally there was sugar in your urine, this can be a symptom of diabetes.  Please call your primary care provider this week to schedule a follow-up appointment to discuss diabetes as well as your elevated blood pressure.  Please be sure to take your blood pressure medication as prescribed and follow-up with your primary care provider this week for blood pressure recheck and medication management.  Get help right away if: You have chest pain. You feel very weak or you pass out (faint). You see blood in your throw-up. Your throw-up looks like coffee grounds. You have bloody or black poop (stools) or poop that look like tar. You have a very bad headache, a stiff neck, or both. You have a rash. You have very bad pain, cramping, or bloating in your belly (abdomen). You have trouble breathing. You are breathing very quickly. Your heart is beating very quickly. Your skin feels cold and clammy. You feel confused. You have pain when you pee. You have signs of dehydration, such as: Dark pee, hardly any pee, or no  pee. Cracked lips. Dry mouth. Sunken eyes. Sleepiness. Weakness.  Please read the additional information packets attached to your discharge summary.  Do not take your medicine if  develop an itchy rash, swelling in your mouth or lips, or difficulty breathing.

## 2019-04-02 ENCOUNTER — Other Ambulatory Visit: Payer: Self-pay

## 2019-04-02 ENCOUNTER — Emergency Department (HOSPITAL_COMMUNITY)
Admission: EM | Admit: 2019-04-02 | Discharge: 2019-04-02 | Disposition: A | Discharge status: Home / Self Care | Payer: Self-pay | Attending: Emergency Medicine | Admitting: Emergency Medicine

## 2019-04-02 ENCOUNTER — Encounter (HOSPITAL_COMMUNITY): Payer: Self-pay

## 2019-04-02 DIAGNOSIS — R202 Paresthesia of skin: Secondary | ICD-10-CM | POA: Insufficient documentation

## 2019-04-02 DIAGNOSIS — I10 Essential (primary) hypertension: Secondary | ICD-10-CM | POA: Insufficient documentation

## 2019-04-02 DIAGNOSIS — M542 Cervicalgia: Secondary | ICD-10-CM | POA: Insufficient documentation

## 2019-04-02 DIAGNOSIS — Z79899 Other long term (current) drug therapy: Secondary | ICD-10-CM | POA: Insufficient documentation

## 2019-04-02 DIAGNOSIS — F17210 Nicotine dependence, cigarettes, uncomplicated: Secondary | ICD-10-CM | POA: Insufficient documentation

## 2019-04-02 MED ORDER — PREDNISONE 10 MG (21) PO TBPK: ORAL_TABLET | Freq: Every day | ORAL | 0 refills | Status: DC

## 2019-04-02 MED ORDER — CYCLOBENZAPRINE HCL 10 MG PO TABS: 10.0000 mg | ORAL_TABLET | Freq: Three times a day (TID) | ORAL | 0 refills | Status: AC

## 2019-04-02 NOTE — Discharge Instructions (Signed)
Given your symptoms and physical exam findings, I suspect you have muscular spasm.  This could also be leading to nerve inflammation.   We will treat your inflammation with the following medication regimen: Prednisone taper x 5 days Flexeril 10 mg every 8 hours x 5 days Ibuprofen 600 mg + Acetaminophen 1000 mg every 6 hours for the next 5 days Heating pad or ice as needed Over the counter lidocaine patches (salonpas) can be helpful  Avoid any exacerbating activities for the next 48 hours.  After 48 hours, start doing light back range of motion exercises and walking to avoid worsening back stiffness.   Return for fevers, chills, neck stiffness, worsening pain, loss of sensation or weakness to extremity  Follow up with spine doctor for persistent pain and symptoms

## 2019-04-02 NOTE — ED Provider Notes (Signed)
[facility] DEPT Provider Note   CSN: [id number] Arrival date & time: 04/02/19  1317    History   Chief Complaint Chief Complaint  Patient presents with  . Arm Pain    HPI Marissa Meyer is a 38 y.o. female presents to the ER for evaluation of left sided neck pain that began 1 week ago.  Described as constant, sharp, "stiff".  Localized to the left side of her neck and radiating to the left trapezius, left shoulder and left upper arm.  She has associated tingling in the left middle and ring fingers.  Symptoms are worse with neck bend and rotation.  She has been icing it and taking ibuprofen with only mild, temporary relief of the pain.  She denies any history of cervical radiculopathy, neck injury or surgeries.  No recent fall or head or neck trauma but states that 1 week ago she was incarcerated and had to sleep on a different bed/pillow.  She denies any associated headache, vision changes, tinnitus, difficulty swallowing, loss of sensation or weakness to her extremity.  She is right-hand dominant.  No associated chest pain.     HPI  Past Medical History:  Diagnosis Date  . Anemia   . Chlamydia   . Ectopic pregnancy   . Fibroid   . Gonorrhea   . Hypertension   . Mental disorder   . Migraines    but not diagnosed  . Pregnancy induced hypertension   . Preterm delivery   . Trichomonas     Patient Active Problem List   Diagnosis Date Noted  . Gestational hypertension 09/19/2015  . Chronic hypertension with exacerbation during pregnancy 09/19/2015  . No prenatal care in current pregnancy in third trimester 09/19/2015  . Pregnancy with adoption planned, currently in third trimester 09/19/2015  . Cocaine abuse affecting pregnancy in third trimester ([place]) 03/14/2014  . Tobacco use in pregnancy, antepartum 02/20/2014    Past Surgical History:  Procedure Laterality Date  . CESAREAN SECTION N/A 03/21/2014   Procedure: CESAREAN SECTION;  Surgeon:  Osborne Oman, MD;  Location: [facility];  Service: Obstetrics;  Laterality: N/A;  . DILATION AND CURETTAGE OF UTERUS    . ECTOPIC PREGNANCY SURGERY       OB History    Gravida  5   Para  4   Term  0   Preterm  4   AB  1   Living  3     SAB      TAB      Ectopic  1   Multiple  0   Live Births  3            Home Medications    Prior to Admission medications   Medication Sig Start Date End Date Taking? Authorizing Provider  acetaminophen (TYLENOL) 500 MG tablet Take 1 tablet (500 mg total) by mouth every 6 (six) hours as needed. 08/06/18   Ashley Murrain, NP  amLODipine (NORVASC) 5 MG tablet Take 1 tablet (5 mg total) by mouth daily. Patient not taking: Reported on 12/24/2018 08/15/18   Scot Jun, FNP  atorvastatin (LIPITOR) 20 MG tablet Take 1 tablet (20 mg total) by mouth daily. Patient not taking: Reported on 12/24/2018 08/22/18   Scot Jun, FNP  clindamycin (CLEOCIN) 150 MG capsule Take 1 capsule (150 mg total) by mouth every 6 (six) hours. Patient not taking: Reported on 12/24/2018 11/21/18   Deno Etienne, DO  cyclobenzaprine (FLEXERIL) 10 MG  tablet Take 1 tablet (10 mg total) by mouth 3 (three) times daily for 5 days. 04/02/19 04/07/19  Kinnie Feil, PA-C  hydrochlorothiazide (HYDRODIURIL) 25 MG tablet Take 1 tablet (25 mg total) by mouth daily. Patient not taking: Reported on 12/24/2018 08/15/18   Scot Jun, FNP  meloxicam (MOBIC) 15 MG tablet Take 1 tablet (15 mg total) by mouth daily. Patient not taking: Reported on 12/24/2018 08/15/18   Scot Jun, FNP  ondansetron (ZOFRAN ODT) 4 MG disintegrating tablet Take 1 tablet (4 mg total) by mouth every 8 (eight) hours as needed for nausea or vomiting. 12/24/18   Nuala Alpha A, PA-C  predniSONE (STERAPRED UNI-PAK 21 TAB) 10 MG (21) TBPK tablet Take by mouth daily. Take 6 tabs by mouth daily  for 2 days, then 5 tabs for 2 days, then 4 tabs for 2 days, then 3 tabs for 2 days, 2 tabs for  2 days, then 1 tab by mouth daily for 2 days 04/02/19   Kinnie Feil, PA-C    Family History Family History  Problem Relation Age of Onset  . Hypertension Mother   . Hypertension Maternal Grandmother   . Cancer Maternal Grandfather     Social History Social History   Tobacco Use  . Smoking status: Current Every Day Smoker    Packs/day: 0.25    Types: Cigars    Last attempt to quit: 08/13/2013    Years since quitting: 5.6  . Smokeless tobacco: Never Used  Substance Use Topics  . Alcohol use: No    Frequency: Never  . Drug use: Yes    Types: Marijuana     Allergies   Amoxicillin   Review of Systems Review of Systems  Musculoskeletal: Positive for myalgias and neck pain.  Neurological:       Paresthesias  All other systems reviewed and are negative.    Physical Exam Updated Vital Signs BP (!) 167/113 (BP Location: Right Arm)   Pulse 95   Temp 99.1 F (37.3 C) (Oral)   Resp 18   LMP 03/25/2019 (Approximate)   SpO2 100%   Physical Exam Constitutional:      General: She is not in acute distress.    Appearance: She is well-developed.  HENT:     Head: Normocephalic and atraumatic.     Right Ear: External ear normal.     Left Ear: External ear normal.     Nose: Nose normal.  Eyes:     General: Lids are normal.  Neck:     Musculoskeletal: Muscular tenderness present.     Thyroid: No thyromegaly.     Trachea: Trachea and phonation normal.     Comments: Left paraspinal muscular tenderness from the left base of occipital insertion, trapezius. Pain with rotation, bend of neck. Positive Spurling's with pain and tingling in left middle/ring fingers. Negative Adson's. No rigidity or meningeal signs. Trachea midline. Thyroid non palpable. Cardiovascular:     Rate and Rhythm: Normal rate and regular rhythm.     Comments: 2+ radial pulses bilaterally. Good cap refill to fingers bilaterally.  Pulmonary:     Effort: Pulmonary effort is normal.     Breath sounds:  Normal breath sounds.  Musculoskeletal:     Cervical back: She exhibits tenderness.     Comments: Left shoulder: Pain to trapezius with left shoulder flexion and abduction.  Positive Neer's and Hawkin's.  No point tenderness to sternum, anterior chest wall, scapula, clavicle, AC or Vantage joints. Negative Speed's  and Yergason's test. Negative drop arm test  Skin:    General: Skin is warm and dry.     Capillary Refill: Capillary refill takes less than 2 seconds.  Neurological:     Mental Status: She is alert and oriented to person, place, and time.     Comments: Sensation to light touch intact in upper extremities 5/5 strength with hand grip and finger abduction bilaterally Brachioradialis DTRs symmetric bilaterally  Psychiatric:        Behavior: Behavior normal.        Thought Content: Thought content normal.      ED Treatments / Results  Labs (all labs ordered are listed, but only abnormal results are displayed) Labs Reviewed - No data to display  EKG None  Radiology No results found.  Procedures Procedures (including critical care time)  Medications Ordered in ED Medications - No data to display   Initial Impression / Assessment and Plan / ED Course  I have reviewed the triage vital signs and the nursing notes.  Pertinent labs & imaging results that were available during my care of the patient were reviewed by me and considered in my medical decision making (see chart for details).  38 year old presents with left-sided neck pain associated with intermittent paresthesias of the left ring and middle fingers.  No trauma or previous history of bulging disc, neck injury or surgery.  However, recently slept on a different bed/pillow which could have exacerbated any MSK/radiculopathy.  Exam reveals positive Spurling's test and reproducible tenderness to the left neck muscles, trapezius which points to radiculopathy.  No focal weakness.  No sensory loss.  No muscle atrophy.  No  associated fevers, meningismus, recent injections in the neck, IV drug use.  I doubt dissection, meningitis, epidural abscess or bony fracture given exam and history.  I do not think emergent lab work or imaging is indicated at this time since patient has no red flag symptoms of neck pain.  We will discharge with high-dose NSAIDs, prednisone taper, muscle relaxer, heat/ice, stretching.  Advised patient to avoid aggravating activities until symptoms start to improve.  Educated patient on red flag symptoms that would warrant return to ED, patient verbalized understanding.  Patient advised to f/u with PCP for possibly PT and long term treatment of symptoms.    Final Clinical Impressions(s) / ED Diagnoses   Final diagnoses:  Neck pain on left side  Paresthesias    ED Discharge Orders         Ordered    predniSONE (STERAPRED UNI-PAK 21 TAB) 10 MG (21) TBPK tablet  Daily     04/02/19 1603    cyclobenzaprine (FLEXERIL) 10 MG tablet  3 times daily     04/02/19 1603           Arlean Hopping 04/02/19 1734    Isla Pence, MD 04/02/19 2227

## 2019-04-02 NOTE — ED Triage Notes (Signed)
She c/o non-traumatic pain of left neck radiating into left shoulder and arm x 1 week. She is in no distress. She also mentions she has run out of her HCTZ 25mg  > 1 month ago.

## 2019-04-11 ENCOUNTER — Other Ambulatory Visit: Payer: Self-pay

## 2019-04-11 ENCOUNTER — Emergency Department (HOSPITAL_COMMUNITY): Payer: Self-pay

## 2019-04-11 ENCOUNTER — Encounter (HOSPITAL_COMMUNITY): Payer: Self-pay

## 2019-04-11 ENCOUNTER — Emergency Department (HOSPITAL_COMMUNITY)
Admission: EM | Admit: 2019-04-11 | Discharge: 2019-04-11 | Disposition: A | Discharge status: Home / Self Care | Payer: Self-pay | Attending: Emergency Medicine | Admitting: Emergency Medicine

## 2019-04-11 DIAGNOSIS — H538 Other visual disturbances: Secondary | ICD-10-CM | POA: Insufficient documentation

## 2019-04-11 DIAGNOSIS — I10 Essential (primary) hypertension: Secondary | ICD-10-CM | POA: Insufficient documentation

## 2019-04-11 DIAGNOSIS — F17290 Nicotine dependence, other tobacco product, uncomplicated: Secondary | ICD-10-CM | POA: Insufficient documentation

## 2019-04-11 DIAGNOSIS — M542 Cervicalgia: Secondary | ICD-10-CM | POA: Insufficient documentation

## 2019-04-11 DIAGNOSIS — F1210 Cannabis abuse, uncomplicated: Secondary | ICD-10-CM | POA: Insufficient documentation

## 2019-04-11 LAB — DIFFERENTIAL
Abs Immature Granulocytes: 0.07 10*3/uL (ref 0.00–0.07)
Basophils Absolute: 0 10*3/uL (ref 0.0–0.1)
Basophils Relative: 0 %
Eosinophils Absolute: 0.1 10*3/uL (ref 0.0–0.5)
Eosinophils Relative: 1 %
Immature Granulocytes: 1 %
Lymphocytes Relative: 23 %
Lymphs Abs: 3 10*3/uL (ref 0.7–4.0)
Monocytes Absolute: 0.6 10*3/uL (ref 0.1–1.0)
Monocytes Relative: 5 %
Neutro Abs: 9.5 10*3/uL — ABNORMAL HIGH (ref 1.7–7.7)
Neutrophils Relative %: 70 %

## 2019-04-11 LAB — COMPREHENSIVE METABOLIC PANEL
ALT: 15 U/L (ref 0–44)
AST: 16 U/L (ref 15–41)
Albumin: 4 g/dL (ref 3.5–5.0)
Alkaline Phosphatase: 53 U/L (ref 38–126)
Anion gap: 10 (ref 5–15)
BUN: 14 mg/dL (ref 6–20)
CO2: 27 mmol/L (ref 22–32)
Calcium: 9.1 mg/dL (ref 8.9–10.3)
Chloride: 103 mmol/L (ref 98–111)
Creatinine, Ser: 1.07 mg/dL — ABNORMAL HIGH (ref 0.44–1.00)
GFR calc Af Amer: >60 mL/min (ref >60)
GFR calc non Af Amer: >60 mL/min (ref >60)
Glucose, Bld: 117 mg/dL — ABNORMAL HIGH (ref 70–99)
Potassium: 3.3 mmol/L — ABNORMAL LOW (ref 3.5–5.1)
Sodium: 140 mmol/L (ref 135–145)
Total Bilirubin: 0.5 mg/dL (ref 0.3–1.2)
Total Protein: 7.2 g/dL (ref 6.5–8.1)

## 2019-04-11 LAB — CBC
HCT: 43.6 % (ref 36.0–46.0)
Hemoglobin: 14.4 g/dL (ref 12.0–15.0)
MCH: 31.8 pg (ref 26.0–34.0)
MCHC: 33 g/dL (ref 30.0–36.0)
MCV: 96.2 fL (ref 80.0–100.0)
Platelets: 288 10*3/uL (ref 150–400)
RBC: 4.53 MIL/uL (ref 3.87–5.11)
RDW: 12.8 % (ref 11.5–15.5)
WBC: 13.2 10*3/uL — ABNORMAL HIGH (ref 4.0–10.5)
nRBC: 0 % (ref 0.0–0.2)

## 2019-04-11 LAB — CBG MONITORING, ED: Glucose-Capillary: 100 mg/dL — ABNORMAL HIGH (ref 70–99)

## 2019-04-11 LAB — I-STAT BETA HCG BLOOD, ED (MC, WL, AP ONLY): I-stat hCG, quantitative: <5 m[IU]/mL (ref <5)

## 2019-04-11 LAB — APTT: aPTT: 28 seconds (ref 24–36)

## 2019-04-11 LAB — PROTIME-INR
INR: 1 (ref 0.8–1.2)
Prothrombin Time: 13.3 seconds (ref 11.4–15.2)

## 2019-04-11 MED ORDER — KETOROLAC TROMETHAMINE 60 MG/2ML IM SOLN
30.0000 mg | Freq: Once | INTRAMUSCULAR | Status: AC
  Administered 2019-04-11: 30 mg via INTRAMUSCULAR
  Filled 2019-04-11: qty 2

## 2019-04-11 MED ORDER — HYDROCHLOROTHIAZIDE 25 MG PO TABS: 25.0000 mg | ORAL_TABLET | Freq: Every day | ORAL | 0 refills | Status: DC

## 2019-04-11 MED ORDER — DIAZEPAM 5 MG PO TABS: 2.5000 mg | ORAL_TABLET | Freq: Three times a day (TID) | ORAL | 0 refills | Status: DC | PRN

## 2019-04-11 MED ORDER — SODIUM CHLORIDE 0.9% FLUSH
3.0000 mL | Freq: Once | INTRAVENOUS | Status: DC
Start: 2019-04-11 — End: 2019-04-12

## 2019-04-11 MED ORDER — HYDROCHLOROTHIAZIDE 25 MG PO TABS
25.0000 mg | ORAL_TABLET | Freq: Once | ORAL | Status: AC
  Administered 2019-04-11: 25 mg via ORAL
  Filled 2019-04-11: qty 1

## 2019-04-11 MED ORDER — DIAZEPAM 2 MG PO TABS
2.0000 mg | ORAL_TABLET | Freq: Once | ORAL | Status: AC
  Administered 2019-04-11: 23:00:00 2 mg via ORAL
  Filled 2019-04-11: qty 1

## 2019-04-11 NOTE — ED Provider Notes (Signed)
Emergency Department Provider Note   I have reviewed the triage vital signs and the nursing notes.   HISTORY  Chief Complaint Numbness and Blurred Vision   HPI Marissa Meyer is a 38 y.o. female with multi-medical problems documented below who presents the emergency department today with multiple symptoms.  Patient states that she has elevated blood pressure because she cannot afford her medications even though she smokes a pack a day.  She also states that she has blurry vision that comes with her blood pressure is high which is normal for her.  The main reason why she is here is because she has neck pain in the left side of her neck that radiates to her left shoulder.  States that it hurts worse with movement.  Worse with certain positions.  Worse with heat and worse with massage but seem to be better after massage.  She states that sometimes an ice pack will help.  She was diagnosed with radiculopathy about 10 days ago and started on steroids she stated the pain improved some but does not radiate down her arm anymore but she still has the neck pain.  No recent traumas.  No infections or rash.  No fevers.   No other associated or modifying symptoms.    Past Medical History:  Diagnosis Date  . Anemia   . Chlamydia   . Ectopic pregnancy   . Fibroid   . Gonorrhea   . Hypertension   . Mental disorder   . Migraines    but not diagnosed  . Pregnancy induced hypertension   . Preterm delivery   . Trichomonas     Patient Active Problem List   Diagnosis Date Noted  . Gestational hypertension 09/19/2015  . Chronic hypertension with exacerbation during pregnancy 09/19/2015  . No prenatal care in current pregnancy in third trimester 09/19/2015  . Pregnancy with adoption planned, currently in third trimester 09/19/2015  . Cocaine abuse affecting pregnancy in third trimester ([place]) 03/14/2014  . Tobacco use in pregnancy, antepartum 02/20/2014    Past Surgical History:  Procedure  Laterality Date  . CESAREAN SECTION N/A 03/21/2014   Procedure: CESAREAN SECTION;  Surgeon: Osborne Oman, MD;  Location: [facility];  Service: Obstetrics;  Laterality: N/A;  . DILATION AND CURETTAGE OF UTERUS    . ECTOPIC PREGNANCY SURGERY      Current Outpatient Rx  . Order #: [id number] Class: Print  . Order #: [id number] Class: Normal  . Order #: [id number] Class: Normal  . Order #: [id number] Class: Normal  . Order #: [id number] Class: Normal  . Order #: [id number] Class: Normal  . Order #: [id number] Class: Print    Allergies Amoxicillin  Family History  Problem Relation Age of Onset  . Hypertension Mother   . Hypertension Maternal Grandmother   . Cancer Maternal Grandfather     Social History Social History   Tobacco Use  . Smoking status: Current Every Day Smoker    Packs/day: 0.25    Types: Cigars    Last attempt to quit: 08/13/2013    Years since quitting: 5.6  . Smokeless tobacco: Never Used  Substance Use Topics  . Alcohol use: No    Frequency: Never  . Drug use: Yes    Types: Marijuana    Review of Systems  All other systems negative except as documented in the HPI. All pertinent positives and negatives as reviewed in the HPI. ____________________________________________   PHYSICAL EXAM:  VITAL SIGNS: ED Triage Vitals  Enc Vitals Group  BP 04/11/19 1550 (!) 172/112     Pulse Rate 04/11/19 1550 (!) 104     Resp 04/11/19 1550 18     Temp 04/11/19 1550 98.5 F (36.9 C)     Temp Source 04/11/19 1550 Oral     SpO2 04/11/19 1550 100 %     Weight 04/11/19 2150 105 lb (47.6 kg)     Height 04/11/19 2150 4\' 9"  (1.448 m)    Constitutional: Alert and oriented. Well appearing and in no acute distress. Eyes: Conjunctivae are normal. PERRL. EOMI. Head: Atraumatic. Nose: No congestion/rhinnorhea. Mouth/Throat: Mucous membranes are moist.  Oropharynx non-erythematous. Neck: No stridor.  No meningeal signs.   Cardiovascular: Normal rate, regular rhythm. Good  peripheral circulation. Grossly normal heart sounds.   Respiratory: Normal respiratory effort.  No retractions. Lungs CTAB. Gastrointestinal: Soft and nontender. No distention.  Musculoskeletal: tenderness to left side of trapezius worse with ROM and palpation. Neurologic:  Normal speech and language. No gross focal neurologic deficits are appreciated. Normal and symmetric UE strength, sensation to light touch, finger to nose and grip.  Skin:  Skin is warm, dry and intact. No rash noted.   ____________________________________________   LABS (all labs ordered are listed, but only abnormal results are displayed)  Labs Reviewed  CBC - Abnormal; Notable for the following components:      Result Value   WBC 13.2 (*)    All other components within normal limits  DIFFERENTIAL - Abnormal; Notable for the following components:   Neutro Abs 9.5 (*)    All other components within normal limits  COMPREHENSIVE METABOLIC PANEL - Abnormal; Notable for the following components:   Potassium 3.3 (*)    Glucose, Bld 117 (*)    Creatinine, Ser 1.07 (*)    All other components within normal limits  CBG MONITORING, ED - Abnormal; Notable for the following components:   Glucose-Capillary 100 (*)    All other components within normal limits  PROTIME-INR  APTT  I-STAT BETA HCG BLOOD, ED ([facility], [facility], [facility] ONLY)   ____________________________________________  EKG   EKG Interpretation  Date/Time:  Thursday April 11 2019 15:07:19 EDT Ventricular Rate:  116 PR Interval:  118 QRS Duration: 76 QT Interval:  334 QTC Calculation: 464 R Axis:   66 Text Interpretation:  Sinus tachycardia Right atrial enlargement Nonspecific T wave abnormality Abnormal ECG mild ST changes in aVL similar to previous Confirmed by Merrily Pew [phone]) on 04/11/2019 11:30:14 PM       ____________________________________________  RADIOLOGY  Ct Head Wo Contrast  Result Date: 04/11/2019 CLINICAL DATA:  Left arm pain and  numbness, blurred vision EXAM: CT HEAD WITHOUT CONTRAST TECHNIQUE: Contiguous axial images were obtained from the base of the skull through the vertex without intravenous contrast. COMPARISON:  None. FINDINGS: Brain: No evidence of acute infarction, hemorrhage, hydrocephalus, extra-axial collection or mass lesion/mass effect. Vascular: No hyperdense vessel or unexpected calcification. Skull: Normal. Negative for fracture or focal lesion. Sinuses/Orbits: No acute finding. Other: None. IMPRESSION: No acute intracranial pathology. Consider MRI to more sensitively evaluate for acute diffusion restricting infarction if suspected. Electronically Signed   By: Eddie Candle M.D.   On: 04/11/2019 17:24    ____________________________________________   PROCEDURES  Procedure(s) performed:   Procedures   ____________________________________________   INITIAL IMPRESSION / ASSESSMENT AND PLAN / ED COURSE  Patient's symptoms seem consistent with a muscular cause for her pain.  Low concern for cardiovascular accident or spinal pathology at this point.  Not radiculopathic is  worse with palpation and movement and she describes it as certain positions relieve the pain and others do not.  Discussed with her symptomatic treatment at home with muscle relaxers, heat, range of motion and massage.  Follow-up with PCP in a week if not improving.  Blood pressure improved on my evaluation.  Patient has no blurred vision at this time.  Once again it think it is unlikely that she has a stroke to blurry vision was in both eyes and similar to previous episodes of hypertension. Will give another Rx for HCTZ and rely on PCP follow up for HTN management.    Pertinent labs & imaging results that were available during my care of the patient were reviewed by me and considered in my medical decision making (see chart for details).  A medical screening exam was performed and I feel the patient has had an appropriate workup for  their chief complaint at this time and likelihood of emergent condition existing is low. They have been counseled on decision, discharge, follow up and which symptoms necessitate immediate return to the emergency department. They or their family verbally stated understanding and agreement with plan and discharged in stable condition.   ____________________________________________  FINAL CLINICAL IMPRESSION(S) / ED DIAGNOSES  Final diagnoses:  Neck pain     MEDICATIONS GIVEN DURING THIS VISIT:  Medications  sodium chloride flush (NS) 0.9 % injection 3 mL (has no administration in time range)  diazepam (VALIUM) tablet 2 mg (2 mg Oral Given 04/11/19 2315)  ketorolac (TORADOL) injection 30 mg (30 mg Intramuscular Given 04/11/19 2316)  hydrochlorothiazide (HYDRODIURIL) tablet 25 mg (25 mg Oral Given 04/11/19 2346)     NEW OUTPATIENT MEDICATIONS STARTED DURING THIS VISIT:  Discharge Medication List as of 04/11/2019 11:36 PM    START taking these medications   Details  diazepam (VALIUM) 5 MG tablet Take 0.5 tablets (2.5 mg total) by mouth every 8 (eight) hours as needed (spasms)., Starting Thu 04/11/2019, Normal        Note:  This note was prepared with assistance of Dragon voice recognition software. Occasional wrong-word or sound-a-like substitutions may have occurred due to the inherent limitations of voice recognition software.   Henli Hey, Corene Cornea, MD 04/12/19 0130

## 2019-04-11 NOTE — ED Triage Notes (Signed)
Pt reports left arm pain (pins and needles) along with numbness that has been going on for 1 week. Pt also c.o blurred vision in both eyes since 12pm today. Denies any headache or dizziness. Pt a.o, nad noted

## 2019-07-26 ENCOUNTER — Emergency Department (HOSPITAL_COMMUNITY)
Admission: EM | Admit: 2019-07-26 | Discharge: 2019-07-26 | Disposition: A | Discharge status: Home / Self Care | Payer: Self-pay | Attending: Emergency Medicine | Admitting: Emergency Medicine

## 2019-07-26 ENCOUNTER — Encounter (HOSPITAL_COMMUNITY): Payer: Self-pay | Admitting: Emergency Medicine

## 2019-07-26 ENCOUNTER — Other Ambulatory Visit: Payer: Self-pay

## 2019-07-26 DIAGNOSIS — Z791 Long term (current) use of non-steroidal anti-inflammatories (NSAID): Secondary | ICD-10-CM | POA: Insufficient documentation

## 2019-07-26 DIAGNOSIS — Z79899 Other long term (current) drug therapy: Secondary | ICD-10-CM | POA: Insufficient documentation

## 2019-07-26 DIAGNOSIS — Y999 Unspecified external cause status: Secondary | ICD-10-CM | POA: Insufficient documentation

## 2019-07-26 DIAGNOSIS — Z23 Encounter for immunization: Secondary | ICD-10-CM | POA: Insufficient documentation

## 2019-07-26 DIAGNOSIS — S65510A Laceration of blood vessel of right index finger, initial encounter: Secondary | ICD-10-CM | POA: Insufficient documentation

## 2019-07-26 DIAGNOSIS — F17290 Nicotine dependence, other tobacco product, uncomplicated: Secondary | ICD-10-CM | POA: Insufficient documentation

## 2019-07-26 DIAGNOSIS — Y939 Activity, unspecified: Secondary | ICD-10-CM | POA: Insufficient documentation

## 2019-07-26 DIAGNOSIS — W268XXA Contact with other sharp object(s), not elsewhere classified, initial encounter: Secondary | ICD-10-CM | POA: Insufficient documentation

## 2019-07-26 DIAGNOSIS — Y929 Unspecified place or not applicable: Secondary | ICD-10-CM | POA: Insufficient documentation

## 2019-07-26 DIAGNOSIS — I10 Essential (primary) hypertension: Secondary | ICD-10-CM | POA: Insufficient documentation

## 2019-07-26 MED ORDER — TETANUS-DIPHTH-ACELL PERTUSSIS 5-2.5-18.5 LF-MCG/0.5 IM SUSP
0.5000 mL | Freq: Once | INTRAMUSCULAR | Status: AC
  Administered 2019-07-26: 20:00:00 0.5 mL via INTRAMUSCULAR
  Filled 2019-07-26: qty 0.5

## 2019-07-26 MED ORDER — LIDOCAINE HCL (PF) 1 % IJ SOLN
30.0000 mL | Freq: Once | INTRAMUSCULAR | Status: AC
  Administered 2019-07-26: 30 mL
  Filled 2019-07-26: qty 30

## 2019-07-26 MED ORDER — ACETAMINOPHEN 500 MG PO TABS: 500.0000 mg | ORAL_TABLET | Freq: Four times a day (QID) | ORAL | 0 refills | Status: DC | PRN

## 2019-07-26 MED ORDER — IBUPROFEN 600 MG PO TABS: 600.0000 mg | ORAL_TABLET | Freq: Four times a day (QID) | ORAL | 0 refills | Status: DC | PRN

## 2019-07-26 MED ORDER — HYDROCODONE-ACETAMINOPHEN 5-325 MG PO TABS
1.0000 | ORAL_TABLET | Freq: Once | ORAL | Status: AC
  Administered 2019-07-26: 20:00:00 1 via ORAL
  Filled 2019-07-26: qty 1

## 2019-07-26 MED ORDER — ACETAMINOPHEN 500 MG PO TABS
1000.0000 mg | ORAL_TABLET | Freq: Once | ORAL | Status: AC
  Administered 2019-07-26: 1000 mg via ORAL
  Filled 2019-07-26: qty 2

## 2019-07-26 NOTE — ED Triage Notes (Signed)
Per pt, states she was getting her keys out of her bag when 1 of her kids pulled the key ring cutting her right index finger

## 2019-07-26 NOTE — ED Notes (Signed)
Wound irrigated and cleaned. Supplies at bedside.

## 2019-07-26 NOTE — ED Provider Notes (Signed)
[facility] DEPT Provider Note   CSN: [id number] Arrival date & time: 07/26/19  1714     History   Chief Complaint Chief Complaint  Patient presents with  . finger laceration    HPI Marissa Meyer is a 38 y.o. female      HPI Patient presents for right second finger middle phalanx laceration that occurred at 5 PM today when she had metal keychain half after finger jerked forcefully way to her which caused to the pulp of the patient cut her finger.  Patient states that she has had constant, sharp, moderate pain since then.  Patient states that she controlled bleeding with direct pressure.  Patient denies any possibility of embedded foreign object.  States that she is not sure when her last tetanus was.  Patient denies any history of diabetes, immunocompromise, or chronic steroid use.  Denies any headache dizziness, lightheadedness  Past Medical History:  Diagnosis Date  . Anemia   . Chlamydia   . Ectopic pregnancy   . Fibroid   . Gonorrhea   . Hypertension   . Mental disorder   . Migraines    but not diagnosed  . Pregnancy induced hypertension   . Preterm delivery   . Trichomonas     Patient Active Problem List   Diagnosis Date Noted  . Gestational hypertension 09/19/2015  . Chronic hypertension with exacerbation during pregnancy 09/19/2015  . No prenatal care in current pregnancy in third trimester 09/19/2015  . Pregnancy with adoption planned, currently in third trimester 09/19/2015  . Cocaine abuse affecting pregnancy in third trimester ([place]) 03/14/2014  . Tobacco use in pregnancy, antepartum 02/20/2014    Past Surgical History:  Procedure Laterality Date  . CESAREAN SECTION N/A 03/21/2014   Procedure: CESAREAN SECTION;  Surgeon: Osborne Oman, MD;  Location: [facility];  Service: Obstetrics;  Laterality: N/A;  . DILATION AND CURETTAGE OF UTERUS    . ECTOPIC PREGNANCY SURGERY       OB History    Gravida  5   Para  4   Term  0   Preterm  4   AB  1   Living  3     SAB      TAB      Ectopic  1   Multiple  0   Live Births  3            Home Medications    Prior to Admission medications   Medication Sig Start Date End Date Taking? Authorizing Provider  acetaminophen (TYLENOL) 500 MG tablet Take 1 tablet (500 mg total) by mouth every 6 (six) hours as needed. 07/26/19   Tedd Sias, PA  amLODipine (NORVASC) 5 MG tablet Take 1 tablet (5 mg total) by mouth daily. 08/15/18   Scot Jun, FNP  atorvastatin (LIPITOR) 20 MG tablet Take 1 tablet (20 mg total) by mouth daily. 08/22/18   Scot Jun, FNP  diazepam (VALIUM) 5 MG tablet Take 0.5 tablets (2.5 mg total) by mouth every 8 (eight) hours as needed (spasms). 04/11/19   Mesner, Corene Cornea, MD  hydrochlorothiazide (HYDRODIURIL) 25 MG tablet Take 1 tablet (25 mg total) by mouth daily. 04/11/19   Mesner, Corene Cornea, MD  ibuprofen (ADVIL) 600 MG tablet Take 1 tablet (600 mg total) by mouth every 6 (six) hours as needed. 07/26/19   Tedd Sias, PA  meloxicam (MOBIC) 15 MG tablet Take 1 tablet (15 mg total) by mouth daily. 08/15/18  Scot Jun, FNP  ondansetron (ZOFRAN ODT) 4 MG disintegrating tablet Take 1 tablet (4 mg total) by mouth every 8 (eight) hours as needed for nausea or vomiting. 12/24/18   Deliah Boston, PA-C    Family History Family History  Problem Relation Age of Onset  . Hypertension Mother   . Hypertension Maternal Grandmother   . Cancer Maternal Grandfather     Social History Social History   Tobacco Use  . Smoking status: Current Every Day Smoker    Packs/day: 0.25    Types: Cigars    Last attempt to quit: 08/13/2013    Years since quitting: 5.9  . Smokeless tobacco: Never Used  Substance Use Topics  . Alcohol use: No    Frequency: Never  . Drug use: Yes    Types: Marijuana     Allergies   Amoxicillin   Review of Systems Review of Systems  Constitutional: Negative for fever.  HENT:  Negative for congestion.   Respiratory: Negative for shortness of breath.   Cardiovascular: Negative for chest pain.  Gastrointestinal: Negative for abdominal pain.  Skin: Positive for wound. Negative for rash.  Neurological: Negative for dizziness.     Physical Exam Updated Vital Signs There were no vitals taken for this visit.  Physical Exam Vitals signs and nursing note reviewed.  Constitutional:      General: She is not in acute distress.    Appearance: Normal appearance. She is not ill-appearing.  HENT:     Head: Normocephalic and atraumatic.  Eyes:     General: No scleral icterus.       Right eye: No discharge.        Left eye: No discharge.     Conjunctiva/sclera: Conjunctivae normal.  Pulmonary:     Effort: Pulmonary effort is normal.     Breath sounds: No stridor.  Musculoskeletal:     Comments: Able to flex and extend second finger right hand against resistance.  Skin:    Comments: 2.5 cm V shaped cut to the right second finger over the middle phalanx but does not involve the joint, there is no obvious foreign body, bleeding is well controlled  Neurological:     Mental Status: She is alert and oriented to person, place, and time. Mental status is at baseline.     Comments: Patient has good two-point discrimination right second finger     ED Treatments / Results  Labs (all labs ordered are listed, but only abnormal results are displayed) Labs Reviewed - No data to display  EKG None  Radiology No results found.  Procedures .Marland KitchenLaceration Repair  Date/Time: 07/27/2019 5:15 PM Performed by: Tedd Sias, PA Authorized by: Tedd Sias, PA   Consent:    Consent obtained:  Verbal   Consent given by:  Patient   Risks discussed:  Infection, need for additional repair, pain, poor cosmetic result and poor wound healing   Alternatives discussed:  No treatment and delayed treatment Universal protocol:    Procedure explained and questions answered to  patient or proxy's satisfaction: yes     Relevant documents present and verified: yes     Test results available and properly labeled: yes     Imaging studies available: yes     Required blood products, implants, devices, and special equipment available: yes     Site/side marked: yes     Immediately prior to procedure, a time out was called: yes     Patient identity confirmed:  Verbally with patient Anesthesia (see MAR for exact dosages):    Anesthesia method:  Nerve block and local infiltration   Local anesthetic:  Lidocaine 1% w/o epi   Block location:  Digital   Block needle gauge:  27 G   Block anesthetic:  Lidocaine 1% WITH epi   Block technique:  Ring block   Block injection procedure:  Anatomic landmarks identified, introduced needle, negative aspiration for blood and incremental injection   Block outcome:  Incomplete block Laceration details:    Location:  Finger   Finger location:  R index finger   Length (cm):  2.5 Repair type:    Repair type:  Simple Exploration:    Hemostasis achieved with:  Direct pressure   Wound extent: no foreign bodies/material noted     Contaminated: no   Treatment:    Area cleansed with:  Saline   Amount of cleaning:  Standard   Irrigation solution:  Sterile saline   Irrigation volume:  300   Irrigation method:  Pressure wash Skin repair:    Repair method:  Sutures   Suture size:  4-0   Suture material:  Prolene   Suture technique:  Simple interrupted   Number of sutures:  7 Approximation:    Approximation:  Close Post-procedure details:    Dressing:  Antibiotic ointment and non-adherent dressing   Patient tolerance of procedure:  Tolerated with difficulty Comments:     Nerve block initially incomplete, local infiltration of lidocaine used for complete anesthesia.    (including critical care time) 7 4-0 prolene  Medications Ordered in ED Medications  lidocaine (PF) (XYLOCAINE) 1 % injection 30 mL (has no administration in time  range)  Tdap (BOOSTRIX) injection 0.5 mL (0.5 mLs Intramuscular Given 07/26/19 1936)  HYDROcodone-acetaminophen (NORCO/VICODIN) 5-325 MG per tablet 1 tablet (1 tablet Oral Given 07/26/19 1934)     Initial Impression / Assessment and Plan / ED Course  I have reviewed the triage vital signs and the nursing notes.  Pertinent labs & imaging results that were available during my care of the patient were reviewed by me and considered in my medical decision making (see chart for details).        Patient is 38 year old female presenting for laceration to right second finger.  No true trauma to hand so doubt fracture, laceration was caused by key chain pulling through her finger skin. No indication of xray.   Pressure irrigation performed. Wound explored and base of wound visualized in a bloodless field without evidence of foreign body.  Laceration occurred < 8 hours prior to repair which was well tolerated. Tdap updated.  Pt has no comorbidities to effect normal wound healing. Pt discharged without antibiotics.  Discussed suture home care with patient and answered questions. Pt to follow-up for wound check and suture removal in 7 days; they are to return to the ED sooner for signs of infection. Pt is hemodynamically stable with no complaints prior to dc. Patient placed in finger splint before discharge and given follow up information for hand specialist to follow up with at her discretion.    Final Clinical Impressions(s) / ED Diagnoses   Final diagnoses:  Laceration of blood vessel of right index finger, initial encounter    ED Discharge Orders         Ordered    acetaminophen (TYLENOL) 500 MG tablet  Every 6 hours PRN     07/26/19 2220    ibuprofen (ADVIL) 600 MG tablet  Every  6 hours PRN,   Status:  Discontinued     07/26/19 2220    ibuprofen (ADVIL) 600 MG tablet  Every 6 hours PRN     07/26/19 2222           Tedd Sias, [place] 07/27/19 1722    Maudie Flakes, MD 07/28/19  1504

## 2019-07-26 NOTE — Discharge Instructions (Addendum)
Alternate Tylenol and ibuprofen every 6 hours you may use 600 mg of ibuprofen in each dose and 1 g of Tylenol each dose.  Please read the label to make sure that you are not taking too much medication.  You should not be taking more than 2 doses of each medication in a 24-hour.    Sutured repair Keep the laceration site dry for the next 24 hours and leave the dressing in place. After 24 hours you may remove the dressing and gently clean the laceration site with antibacterial soap and warm water. Do not scrub the area. Do not soak the area and water for long periods of time. Dont use hydrogen peroxide, iodine-based solutions, or alcohol, which can slow healing, and will probably be painful! Apply topical bacitracin 1-2 times per day for the next 3-5 days. Return to the emergency department in 7-10 days for removal of the sutures.  You should return sooner for any signs of infection which would include increased redness around the wound, increased swelling, new drainage of yellow pus.

## 2019-08-06 ENCOUNTER — Other Ambulatory Visit: Payer: Self-pay

## 2019-08-06 ENCOUNTER — Encounter (HOSPITAL_COMMUNITY): Payer: Self-pay

## 2019-08-06 ENCOUNTER — Emergency Department (HOSPITAL_COMMUNITY)
Admission: EM | Admit: 2019-08-06 | Discharge: 2019-08-06 | Disposition: A | Discharge status: Home / Self Care | Payer: Self-pay | Attending: Emergency Medicine | Admitting: Emergency Medicine

## 2019-08-06 DIAGNOSIS — Z4802 Encounter for removal of sutures: Secondary | ICD-10-CM | POA: Insufficient documentation

## 2019-08-06 DIAGNOSIS — X58XXXA Exposure to other specified factors, initial encounter: Secondary | ICD-10-CM | POA: Insufficient documentation

## 2019-08-06 DIAGNOSIS — Z5321 Procedure and treatment not carried out due to patient leaving prior to being seen by health care provider: Secondary | ICD-10-CM | POA: Insufficient documentation

## 2019-08-06 DIAGNOSIS — S61210D Laceration without foreign body of right index finger without damage to nail, subsequent encounter: Secondary | ICD-10-CM | POA: Insufficient documentation

## 2019-08-06 NOTE — ED Triage Notes (Signed)
Patient returning for suture removal to the right pointer finger.

## 2019-08-10 ENCOUNTER — Emergency Department (HOSPITAL_COMMUNITY): Payer: Self-pay

## 2019-08-10 ENCOUNTER — Emergency Department (HOSPITAL_COMMUNITY)
Admission: EM | Admit: 2019-08-10 | Discharge: 2019-08-10 | Disposition: A | Discharge status: Home / Self Care | Payer: Self-pay | Attending: Emergency Medicine | Admitting: Emergency Medicine

## 2019-08-10 ENCOUNTER — Other Ambulatory Visit: Payer: Self-pay

## 2019-08-10 ENCOUNTER — Encounter (HOSPITAL_COMMUNITY): Payer: Self-pay | Admitting: Emergency Medicine

## 2019-08-10 DIAGNOSIS — Z5189 Encounter for other specified aftercare: Secondary | ICD-10-CM | POA: Insufficient documentation

## 2019-08-10 DIAGNOSIS — S61210D Laceration without foreign body of right index finger without damage to nail, subsequent encounter: Secondary | ICD-10-CM | POA: Insufficient documentation

## 2019-08-10 DIAGNOSIS — Z4802 Encounter for removal of sutures: Secondary | ICD-10-CM

## 2019-08-10 DIAGNOSIS — Z791 Long term (current) use of non-steroidal anti-inflammatories (NSAID): Secondary | ICD-10-CM | POA: Insufficient documentation

## 2019-08-10 DIAGNOSIS — X58XXXD Exposure to other specified factors, subsequent encounter: Secondary | ICD-10-CM | POA: Insufficient documentation

## 2019-08-10 DIAGNOSIS — Z79899 Other long term (current) drug therapy: Secondary | ICD-10-CM | POA: Insufficient documentation

## 2019-08-10 DIAGNOSIS — I10 Essential (primary) hypertension: Secondary | ICD-10-CM | POA: Insufficient documentation

## 2019-08-10 DIAGNOSIS — F17290 Nicotine dependence, other tobacco product, uncomplicated: Secondary | ICD-10-CM | POA: Insufficient documentation

## 2019-08-10 MED ORDER — HYDROCODONE-ACETAMINOPHEN 5-325 MG PO TABS
1.0000 | ORAL_TABLET | Freq: Once | ORAL | Status: AC
  Administered 2019-08-10: 1 via ORAL
  Filled 2019-08-10: qty 1

## 2019-08-10 MED ORDER — CLINDAMYCIN HCL 300 MG PO CAPS: 300.0000 mg | ORAL_CAPSULE | Freq: Three times a day (TID) | ORAL | 0 refills | Status: AC

## 2019-08-10 MED ORDER — CLINDAMYCIN HCL 300 MG PO CAPS
300.0000 mg | ORAL_CAPSULE | Freq: Once | ORAL | Status: AC
  Administered 2019-08-10: 17:00:00 300 mg via ORAL
  Filled 2019-08-10: qty 1

## 2019-08-10 NOTE — ED Provider Notes (Signed)
[facility] DEPT Provider Note   CSN: [id number] Arrival date & time: 08/10/19  1406     History   Chief Complaint Chief Complaint  Patient presents with  . Suture / Staple Removal    HPI Marissa Meyer is a 38 y.o. female with a past medical history significant for anemia and hypertension who presents to the ED for suture removal. Patient had a suture repair on 11/13 to her right second finger middle phalanx after a metal keychain caused a laceration. Patient notes she went to the ED a few days earlier, but the wait time was too long. She notes occasional purulent discharge from the site and limited ROM of finger associated with moderate pain. Patient has been putting antibiotic ointment on it daily. Patient denies fever and chills.      Past Medical History:  Diagnosis Date  . Anemia   . Chlamydia   . Ectopic pregnancy   . Fibroid   . Gonorrhea   . Hypertension   . Mental disorder   . Migraines    but not diagnosed  . Pregnancy induced hypertension   . Preterm delivery   . Trichomonas     Patient Active Problem List   Diagnosis Date Noted  . Gestational hypertension 09/19/2015  . Chronic hypertension with exacerbation during pregnancy 09/19/2015  . No prenatal care in current pregnancy in third trimester 09/19/2015  . Pregnancy with adoption planned, currently in third trimester 09/19/2015  . Cocaine abuse affecting pregnancy in third trimester ([place]) 03/14/2014  . Tobacco use in pregnancy, antepartum 02/20/2014    Past Surgical History:  Procedure Laterality Date  . CESAREAN SECTION N/A 03/21/2014   Procedure: CESAREAN SECTION;  Surgeon: Osborne Oman, MD;  Location: [facility];  Service: Obstetrics;  Laterality: N/A;  . DILATION AND CURETTAGE OF UTERUS    . ECTOPIC PREGNANCY SURGERY       OB History    Gravida  5   Para  4   Term  0   Preterm  4   AB  1   Living  3     SAB      TAB      Ectopic  1   Multiple  0    Live Births  3            Home Medications    Prior to Admission medications   Medication Sig Start Date End Date Taking? Authorizing Provider  acetaminophen (TYLENOL) 500 MG tablet Take 1 tablet (500 mg total) by mouth every 6 (six) hours as needed. 07/26/19   Tedd Sias, PA  amLODipine (NORVASC) 5 MG tablet Take 1 tablet (5 mg total) by mouth daily. 08/15/18   Scot Jun, FNP  atorvastatin (LIPITOR) 20 MG tablet Take 1 tablet (20 mg total) by mouth daily. 08/22/18   Scot Jun, FNP  clindamycin (CLEOCIN) 300 MG capsule Take 1 capsule (300 mg total) by mouth 3 (three) times daily for 7 days. 08/10/19 08/17/19  Cheek, Comer Locket, PA-C  diazepam (VALIUM) 5 MG tablet Take 0.5 tablets (2.5 mg total) by mouth every 8 (eight) hours as needed (spasms). 04/11/19   Mesner, Corene Cornea, MD  hydrochlorothiazide (HYDRODIURIL) 25 MG tablet Take 1 tablet (25 mg total) by mouth daily. 04/11/19   Mesner, Corene Cornea, MD  ibuprofen (ADVIL) 600 MG tablet Take 1 tablet (600 mg total) by mouth every 6 (six) hours as needed. 07/26/19   Tedd Sias, PA  meloxicam (  MOBIC) 15 MG tablet Take 1 tablet (15 mg total) by mouth daily. 08/15/18   Scot Jun, FNP  ondansetron (ZOFRAN ODT) 4 MG disintegrating tablet Take 1 tablet (4 mg total) by mouth every 8 (eight) hours as needed for nausea or vomiting. 12/24/18   Deliah Boston, PA-C    Family History Family History  Problem Relation Age of Onset  . Hypertension Mother   . Hypertension Maternal Grandmother   . Cancer Maternal Grandfather     Social History Social History   Tobacco Use  . Smoking status: Current Every Day Smoker    Packs/day: 0.25    Types: Cigars    Last attempt to quit: 08/13/2013    Years since quitting: 5.9  . Smokeless tobacco: Never Used  Substance Use Topics  . Alcohol use: No    Frequency: Never  . Drug use: Yes    Types: Marijuana     Allergies   Amoxicillin   Review of Systems Review of  Systems  Constitutional: Negative for chills and fever.  Skin: Positive for color change and wound.     Physical Exam Updated Vital Signs BP (!) 183/93   Pulse 78   Temp 99.5 F (37.5 C) (Oral)   Resp 16   Ht 4\' 9"  (1.448 m)   Wt 56.2 kg   SpO2 100%   BMI 26.83 kg/m   Physical Exam Vitals signs and nursing note reviewed.  Constitutional:      General: She is not in acute distress.    Appearance: She is not ill-appearing.  HENT:     Head: Normocephalic.  Eyes:     Conjunctiva/sclera: Conjunctivae normal.  Neck:     Musculoskeletal: Neck supple.  Cardiovascular:     Rate and Rhythm: Normal rate and regular rhythm.     Pulses: Normal pulses.     Heart sounds: Normal heart sounds. No murmur. No friction rub. No gallop.   Pulmonary:     Effort: Pulmonary effort is normal.     Breath sounds: Normal breath sounds.  Abdominal:     General: Abdomen is flat. There is no distension.     Palpations: Abdomen is soft.     Tenderness: There is no abdominal tenderness. There is no guarding or rebound.  Musculoskeletal:     Comments: Limited active ROM of right 2nd finger. Able to passively move finger slightly more. Able to fully extend, but difficulties with flexion. No fusiform edema. Tenderness surrounding healing laceration, but not along entire tendon. Capillary refill <2.   Skin:    General: Skin is warm.     Comments: Healing laceration of right 2nd finger. See picture below. Mild purulent drainage from site with surrounding erythema.  Neurological:     General: No focal deficit present.     Mental Status: She is alert.        ED Treatments / Results  Labs (all labs ordered are listed, but only abnormal results are displayed) Labs Reviewed - No data to display  EKG None  Radiology Dg Hand Complete Right  Result Date: 08/10/2019 CLINICAL DATA:  Suture removal with pain and swelling EXAM: RIGHT HAND - COMPLETE 3+ VIEW COMPARISON:  None. FINDINGS: There is no  evidence of fracture or dislocation. There is no evidence of arthropathy or other focal bone abnormality. Soft tissues are unremarkable. IMPRESSION: Negative. Electronically Signed   By: Monte Fantasia M.D.   On: 08/10/2019 16:13    Procedures Procedures (including critical care  time)  Medications Ordered in ED Medications  clindamycin (CLEOCIN) capsule 300 mg (300 mg Oral Given 08/10/19 1648)  HYDROcodone-acetaminophen (NORCO/VICODIN) 5-325 MG per tablet 1 tablet (1 tablet Oral Given 08/10/19 1648)     Initial Impression / Assessment and Plan / ED Course  I have reviewed the triage vital signs and the nursing notes.  Pertinent labs & imaging results that were available during my care of the patient were reviewed by me and considered in my medical decision making (see chart for details).    38 year old female presents to the ED for suture removal to right 2nd finger after laceration repair on 11/13. Patient is afebrile, non-septic, and non-ill appearing. Laceration with mild purulent drainage from site. Tenderness around laceration, but not along full tendon sheath. No fusiform edema. No concern for tenosynovitis at this time. X-ray negative for bony fracture and soft tissue abnormalities. Sutures removed. Suture removal was overdue, so a lot of the sutures were ingrown into the skin causing some reopening of laceration. Area thoroughly cleaned and redressed. Patient notes she does not have insurance so she is unable to follow-up with the hand surgery. Advised patient to return to ED in 48 hours for wound recheck. 1 dose of Clindamycin given in ED. Pain managed. Will discharge patient with Clindamycin. Advised patient she can take over the counter ibuprofen or tylenol as needed for pain. Strict ED precautions discussed with patient. Patient states understanding and agrees to plan. Patient discharged home in no acute distress and stable vitals   Final Clinical Impressions(s) / ED Diagnoses    Final diagnoses:  Encounter for wound re-check  Encounter for removal of sutures    ED Discharge Orders         Ordered    clindamycin (CLEOCIN) 300 MG capsule  3 times daily     08/10/19 1757           Romie Levee 08/10/19 2324    Julianne Rice, MD 08/24/19 [phone]

## 2019-08-10 NOTE — ED Notes (Signed)
Yellow area around stiches.

## 2019-08-10 NOTE — ED Triage Notes (Signed)
Patient presents for removal of stiches to the right 2nd digit.

## 2019-08-10 NOTE — Discharge Instructions (Addendum)
I am sending you home with an antibiotic. Please take as prescribed and finish all antibiotics. Please return to the ER in 48 hours for a would recheck. Keep area clean. You can continue to use the antibiotic ointment on your finger. Return to the ER sooner for new or worsening symptoms.

## 2019-08-12 ENCOUNTER — Encounter (HOSPITAL_COMMUNITY): Payer: Self-pay

## 2019-08-12 ENCOUNTER — Other Ambulatory Visit: Payer: Self-pay

## 2019-08-12 ENCOUNTER — Ambulatory Visit (HOSPITAL_COMMUNITY)
Admission: EM | Admit: 2019-08-12 | Discharge: 2019-08-12 | Disposition: A | Discharge status: Home / Self Care | Payer: Self-pay | Attending: Family Medicine | Admitting: Family Medicine

## 2019-08-12 DIAGNOSIS — M6510 Other infective (teno)synovitis, unspecified site: Secondary | ICD-10-CM

## 2019-08-12 MED ORDER — CEFTRIAXONE SODIUM 1 G IJ SOLR
INTRAMUSCULAR | Status: AC
  Filled 2019-08-12: qty 10

## 2019-08-12 MED ORDER — CEFTRIAXONE SODIUM 1 G IJ SOLR
1.0000 g | Freq: Once | INTRAMUSCULAR | Status: AC
  Administered 2019-08-12: 1 g via INTRAMUSCULAR

## 2019-08-12 NOTE — ED Provider Notes (Signed)
[facility]    CSN: [id number] Arrival date & time: 08/12/19  1630      History   Chief Complaint Chief Complaint  Patient presents with  . Follow-up  . Finger Injury    HPI Marissa Meyer is a 38 y.o. female.  She is following up for right index finger pain and laceration.  Her initial injury was on 11/13.  At that time she had a laceration that was in a V formation.  Stitches were placed  and they removed on 11/28.  She was provided clindamycin at that time.  Since that time she has had ongoing stiffness and swelling of the index finger.  She is having limited range of motion.  Pain is becoming more severe.  She endorses altered sensation at the tip as well as at the palmar MCP joint.  HPI  Past Medical History:  Diagnosis Date  . Anemia   . Chlamydia   . Ectopic pregnancy   . Fibroid   . Gonorrhea   . Hypertension   . Mental disorder   . Migraines    but not diagnosed  . Pregnancy induced hypertension   . Preterm delivery   . Trichomonas     Patient Active Problem List   Diagnosis Date Noted  . Gestational hypertension 09/19/2015  . Chronic hypertension with exacerbation during pregnancy 09/19/2015  . No prenatal care in current pregnancy in third trimester 09/19/2015  . Pregnancy with adoption planned, currently in third trimester 09/19/2015  . Cocaine abuse affecting pregnancy in third trimester ([place]) 03/14/2014  . Tobacco use in pregnancy, antepartum 02/20/2014    Past Surgical History:  Procedure Laterality Date  . CESAREAN SECTION N/A 03/21/2014   Procedure: CESAREAN SECTION;  Surgeon: Osborne Oman, MD;  Location: [facility];  Service: Obstetrics;  Laterality: N/A;  . DILATION AND CURETTAGE OF UTERUS    . ECTOPIC PREGNANCY SURGERY      OB History    Gravida  5   Para  4   Term  0   Preterm  4   AB  1   Living  3     SAB      TAB      Ectopic  1   Multiple  0   Live Births  3            Home Medications    Prior  to Admission medications   Medication Sig Start Date End Date Taking? Authorizing Provider  clindamycin (CLEOCIN) 300 MG capsule Take 1 capsule (300 mg total) by mouth 3 (three) times daily for 7 days. 08/10/19 08/17/19 Yes Cheek, Comer Locket, PA-C  acetaminophen (TYLENOL) 500 MG tablet Take 1 tablet (500 mg total) by mouth every 6 (six) hours as needed. 07/26/19   Tedd Sias, PA  amLODipine (NORVASC) 5 MG tablet Take 1 tablet (5 mg total) by mouth daily. 08/15/18   Scot Jun, FNP  atorvastatin (LIPITOR) 20 MG tablet Take 1 tablet (20 mg total) by mouth daily. 08/22/18   Scot Jun, FNP  diazepam (VALIUM) 5 MG tablet Take 0.5 tablets (2.5 mg total) by mouth every 8 (eight) hours as needed (spasms). 04/11/19   Mesner, Corene Cornea, MD  hydrochlorothiazide (HYDRODIURIL) 25 MG tablet Take 1 tablet (25 mg total) by mouth daily. 04/11/19   Mesner, Corene Cornea, MD  ibuprofen (ADVIL) 600 MG tablet Take 1 tablet (600 mg total) by mouth every 6 (six) hours as needed. 07/26/19   Pati Gallo  S, PA  meloxicam (MOBIC) 15 MG tablet Take 1 tablet (15 mg total) by mouth daily. 08/15/18   Scot Jun, FNP  ondansetron (ZOFRAN ODT) 4 MG disintegrating tablet Take 1 tablet (4 mg total) by mouth every 8 (eight) hours as needed for nausea or vomiting. 12/24/18   Deliah Boston, PA-C    Family History Family History  Problem Relation Age of Onset  . Hypertension Mother   . Hypertension Maternal Grandmother   . Cancer Maternal Grandfather   . Hypertension Father     Social History Social History   Tobacco Use  . Smoking status: Current Every Day Smoker    Packs/day: 0.25    Types: Cigars    Last attempt to quit: 08/13/2013    Years since quitting: 6.0  . Smokeless tobacco: Never Used  . Tobacco comment: black and milds; 2-3 per day  Substance Use Topics  . Alcohol use: No    Frequency: Never  . Drug use: Yes    Types: Marijuana     Allergies   Amoxicillin   Review of Systems  Review of Systems  Constitutional: Negative for fever.  HENT: Negative for congestion.   Respiratory: Negative for cough.   Cardiovascular: Negative for chest pain.  Gastrointestinal: Negative for abdominal pain.  Musculoskeletal: Negative for arthralgias.  Skin: Positive for wound.  Neurological: Negative for weakness.  Hematological: Negative for adenopathy.     Physical Exam Triage Vital Signs ED Triage Vitals  Enc Vitals Group     BP 08/12/19 1730 (!) 172/98     Pulse Rate 08/12/19 1730 93     Resp 08/12/19 1730 14     Temp 08/12/19 1730 98.6 F (37 C)     Temp Source 08/12/19 1730 Oral     SpO2 08/12/19 1730 97 %     Weight --      Height --      Head Circumference --      Peak Flow --      Pain Score 08/12/19 1727 1     Pain Loc --      Pain Edu? --      Excl. in [place]? --    No data found.  Updated Vital Signs BP (!) 172/98 (BP Location: Left Arm)   Pulse 93   Temp 98.6 F (37 C) (Oral)   Resp 14   SpO2 97%   Visual Acuity Right Eye Distance:   Left Eye Distance:   Bilateral Distance:    Right Eye Near:   Left Eye Near:    Bilateral Near:     Physical Exam Gen: NAD, alert, cooperative with exam, well-appearing ENT: normal lips, normal nasal mucosa,  Eye: normal EOM, normal conjunctiva and lids CV:  no edema, +2 pedal pulses   Resp: no accessory muscle use, non-labored,  Skin: no rashes, no areas of induration  Neuro: normal tone, normal sensation to touch Psych:  normal insight, alert and oriented MSK:  Right hand:  Healing wound on the palmar aspect of the proximal phalanx. Limited active flexion and extension of the index finger.  This limitation was occurring at the MCP joint, PIP joint and DIP joint. Obvious swelling of the index finger itself. No streaking appreciated. Neurovascular intact  Limited ultrasound: Right index finger:  The flexor tendon appears to be intact at the distal phalanx. There appears to be disruption of the flexor  tendon at the proximal phalanx.  Summary: Findings suggestive of a partial tear  of the flexor tendon at the proximal phalanx  Ultrasound and interpretation by Clearance Coots, MD      UC Treatments / Results  Labs (all labs ordered are listed, but only abnormal results are displayed) Labs Reviewed - No data to display  EKG   Radiology No results found.  Procedures Procedures (including critical care time)  Medications Ordered in UC Medications  cefTRIAXone (ROCEPHIN) injection 1 g (1 g Intramuscular Given 08/12/19 1833)  cefTRIAXone (ROCEPHIN) 1 g injection (has no administration in time range)    Initial Impression / Assessment and Plan / UC Course  I have reviewed the triage vital signs and the nursing notes.  Pertinent labs & imaging results that were available during my care of the patient were reviewed by me and considered in my medical decision making (see chart for details).     Ms. Sabogal is a 38 year old female is following up for her right index finger laceration.  Clinically it appears to be more infected as the wound is having to heal with some secondary intent at this point.  On ultrasound it appears that the flexor tendon has disruption at the proximal phalanx.  She was provided with 1 g of ceftriaxone intramuscular.  A splint and wrap were applied in clinic.  Advised to have close follow-up and return.  If unable to be seen then advised to be seen in the emergency department by hand specialist.  Final Clinical Impressions(s) / UC Diagnoses   Final diagnoses:  Infection of flexor tendon sheath     Discharge Instructions     Please keep the area splinted  Please follow up as soon as you can to be seen  If the pain gets worse then please be seen in the emergency department.     ED Prescriptions    None     PDMP not reviewed this encounter.   Rosemarie Ax, MD 08/12/19 [phone]

## 2019-08-12 NOTE — ED Triage Notes (Signed)
Patient presents to Urgent Care with complaints of needing re-evaluation on her right index finger since she was told it may be infected when she had sutures removed two days ago. Patient reports she was put on clindamycin, has been taking as directed.

## 2019-08-12 NOTE — Discharge Instructions (Signed)
Please keep the area splinted  Please follow up as soon as you can to be seen  If the pain gets worse then please be seen in the emergency department.

## 2019-08-17 ENCOUNTER — Encounter (HOSPITAL_COMMUNITY): Payer: Self-pay

## 2019-08-17 ENCOUNTER — Other Ambulatory Visit: Payer: Self-pay

## 2019-08-17 ENCOUNTER — Emergency Department (HOSPITAL_COMMUNITY)
Admission: EM | Admit: 2019-08-17 | Discharge: 2019-08-17 | Disposition: A | Discharge status: Home / Self Care | Payer: Self-pay | Attending: Emergency Medicine | Admitting: Emergency Medicine

## 2019-08-17 DIAGNOSIS — R112 Nausea with vomiting, unspecified: Secondary | ICD-10-CM | POA: Insufficient documentation

## 2019-08-17 DIAGNOSIS — Z79899 Other long term (current) drug therapy: Secondary | ICD-10-CM | POA: Insufficient documentation

## 2019-08-17 DIAGNOSIS — I10 Essential (primary) hypertension: Secondary | ICD-10-CM | POA: Insufficient documentation

## 2019-08-17 DIAGNOSIS — F17210 Nicotine dependence, cigarettes, uncomplicated: Secondary | ICD-10-CM | POA: Insufficient documentation

## 2019-08-17 LAB — URINALYSIS, ROUTINE W REFLEX MICROSCOPIC
Bilirubin Urine: NEGATIVE
Glucose, UA: NEGATIVE mg/dL
Ketones, ur: NEGATIVE mg/dL
Nitrite: NEGATIVE
Protein, ur: 30 mg/dL — AB
Specific Gravity, Urine: 1.02 (ref 1.005–1.030)
pH: 6 (ref 5.0–8.0)

## 2019-08-17 LAB — COMPREHENSIVE METABOLIC PANEL
ALT: 14 U/L (ref 0–44)
AST: 20 U/L (ref 15–41)
Albumin: 4.4 g/dL (ref 3.5–5.0)
Alkaline Phosphatase: 50 U/L (ref 38–126)
Anion gap: 9 (ref 5–15)
BUN: 11 mg/dL (ref 6–20)
CO2: 28 mmol/L (ref 22–32)
Calcium: 8.9 mg/dL (ref 8.9–10.3)
Chloride: 102 mmol/L (ref 98–111)
Creatinine, Ser: 1.01 mg/dL — ABNORMAL HIGH (ref 0.44–1.00)
GFR calc Af Amer: >60 mL/min (ref >60)
GFR calc non Af Amer: >60 mL/min (ref >60)
Glucose, Bld: 101 mg/dL — ABNORMAL HIGH (ref 70–99)
Potassium: 3.6 mmol/L (ref 3.5–5.1)
Sodium: 139 mmol/L (ref 135–145)
Total Bilirubin: 0.6 mg/dL (ref 0.3–1.2)
Total Protein: 7.8 g/dL (ref 6.5–8.1)

## 2019-08-17 LAB — CBC
HCT: 41.3 % (ref 36.0–46.0)
Hemoglobin: 13.4 g/dL (ref 12.0–15.0)
MCH: 31.8 pg (ref 26.0–34.0)
MCHC: 32.4 g/dL (ref 30.0–36.0)
MCV: 98.1 fL (ref 80.0–100.0)
Platelets: 254 10*3/uL (ref 150–400)
RBC: 4.21 MIL/uL (ref 3.87–5.11)
RDW: 12.4 % (ref 11.5–15.5)
WBC: 7.6 10*3/uL (ref 4.0–10.5)
nRBC: 0 % (ref 0.0–0.2)

## 2019-08-17 LAB — I-STAT BETA HCG BLOOD, ED (MC, WL, AP ONLY): I-stat hCG, quantitative: <5 m[IU]/mL (ref <5)

## 2019-08-17 LAB — LIPASE, BLOOD: Lipase: 27 U/L (ref 11–51)

## 2019-08-17 MED ORDER — SODIUM CHLORIDE 0.9% FLUSH: 3.0000 mL | Freq: Once | INTRAVENOUS | Status: DC

## 2019-08-17 MED ORDER — ONDANSETRON 4 MG PO TBDP: 4.0000 mg | ORAL_TABLET | Freq: Three times a day (TID) | ORAL | 0 refills | Status: DC | PRN

## 2019-08-17 NOTE — ED Provider Notes (Signed)
[facility] DEPT Provider Note   CSN: [id number] Arrival date & time: 08/17/19  1035     History   Chief Complaint Chief Complaint  Patient presents with  . Emesis    HPI Marissa Meyer is a 38 y.o. female with a history of tobacco abuse, anemia, hypertension, migraines, and prior STIs who presents to the emergency department at the request of her boss for evaluation of 1 episode of emesis. Patient states she had 3-4 alcoholic beverages last night in celebration of her birthday, went to bed feeling okay, and woke up feeing nauseated & "hungover." She went to work hoping symptoms would pass, but became progressively more nauseated and had 1 episode of emesis. Her boss sent her to the ED for assessment. She is feeling much better after waiting in the lobby she has tolerate PO chips. Denies fever, chills, hematemesis, abdominal pain, diarrhea, body aches, URI sxs, cough, or dyspnea. Denies concern for STDs, in a monogamous relationship. Currently menstruating.      HPI  Past Medical History:  Diagnosis Date  . Anemia   . Chlamydia   . Ectopic pregnancy   . Fibroid   . Gonorrhea   . Hypertension   . Mental disorder   . Migraines    but not diagnosed  . Pregnancy induced hypertension   . Preterm delivery   . Trichomonas     Patient Active Problem List   Diagnosis Date Noted  . Gestational hypertension 09/19/2015  . Chronic hypertension with exacerbation during pregnancy 09/19/2015  . No prenatal care in current pregnancy in third trimester 09/19/2015  . Pregnancy with adoption planned, currently in third trimester 09/19/2015  . Cocaine abuse affecting pregnancy in third trimester ([place]) 03/14/2014  . Tobacco use in pregnancy, antepartum 02/20/2014    Past Surgical History:  Procedure Laterality Date  . CESAREAN SECTION N/A 03/21/2014   Procedure: CESAREAN SECTION;  Surgeon: Osborne Oman, MD;  Location: [facility];  Service: Obstetrics;   Laterality: N/A;  . DILATION AND CURETTAGE OF UTERUS    . ECTOPIC PREGNANCY SURGERY       OB History    Gravida  5   Para  4   Term  0   Preterm  4   AB  1   Living  3     SAB      TAB      Ectopic  1   Multiple  0   Live Births  3            Home Medications    Prior to Admission medications   Medication Sig Start Date End Date Taking? Authorizing Provider  acetaminophen (TYLENOL) 500 MG tablet Take 1 tablet (500 mg total) by mouth every 6 (six) hours as needed. 07/26/19   Tedd Sias, PA  amLODipine (NORVASC) 5 MG tablet Take 1 tablet (5 mg total) by mouth daily. 08/15/18   Scot Jun, FNP  atorvastatin (LIPITOR) 20 MG tablet Take 1 tablet (20 mg total) by mouth daily. 08/22/18   Scot Jun, FNP  clindamycin (CLEOCIN) 300 MG capsule Take 1 capsule (300 mg total) by mouth 3 (three) times daily for 7 days. 08/10/19 08/17/19  Cheek, Comer Locket, PA-C  diazepam (VALIUM) 5 MG tablet Take 0.5 tablets (2.5 mg total) by mouth every 8 (eight) hours as needed (spasms). 04/11/19   Mesner, Corene Cornea, MD  hydrochlorothiazide (HYDRODIURIL) 25 MG tablet Take 1 tablet (25 mg total) by mouth daily.  04/11/19   Mesner, Corene Cornea, MD  ibuprofen (ADVIL) 600 MG tablet Take 1 tablet (600 mg total) by mouth every 6 (six) hours as needed. 07/26/19   Tedd Sias, PA  meloxicam (MOBIC) 15 MG tablet Take 1 tablet (15 mg total) by mouth daily. 08/15/18   Scot Jun, FNP  ondansetron (ZOFRAN ODT) 4 MG disintegrating tablet Take 1 tablet (4 mg total) by mouth every 8 (eight) hours as needed for nausea or vomiting. 12/24/18   Deliah Boston, PA-C    Family History Family History  Problem Relation Age of Onset  . Hypertension Mother   . Hypertension Maternal Grandmother   . Cancer Maternal Grandfather   . Hypertension Father     Social History Social History   Tobacco Use  . Smoking status: Current Every Day Smoker    Packs/day: 0.25    Types: Cigars    Last  attempt to quit: 08/13/2013    Years since quitting: 6.0  . Smokeless tobacco: Never Used  . Tobacco comment: black and milds; 2-3 per day  Substance Use Topics  . Alcohol use: No    Frequency: Never  . Drug use: Yes    Types: Marijuana     Allergies   Amoxicillin   Review of Systems Review of Systems  Constitutional: Negative for chills and fever.  HENT: Negative for congestion, ear pain and sore throat.   Respiratory: Negative for cough and shortness of breath.   Cardiovascular: Negative for chest pain.  Gastrointestinal: Positive for nausea and vomiting. Negative for abdominal pain, blood in stool, constipation and diarrhea.  Genitourinary: Positive for vaginal bleeding (menses). Negative for dysuria and vaginal discharge.  Neurological: Negative for syncope.  All other systems reviewed and are negative.    Physical Exam Updated Vital Signs BP (!) 167/101 Comment: pt has not taken bp meds in months  Pulse 84   Temp 98.8 F (37.1 C) (Oral)   Resp 16   Ht 4\' 9"  (1.448 m)   Wt 56.2 kg   LMP 08/17/2019   SpO2 100%   BMI 26.83 kg/m   Physical Exam Vitals signs and nursing note reviewed.  Constitutional:      General: She is not in acute distress.    Appearance: She is well-developed. She is not toxic-appearing.  HENT:     Head: Normocephalic and atraumatic.  Eyes:     General:        Right eye: No discharge.        Left eye: No discharge.     Conjunctiva/sclera: Conjunctivae normal.  Neck:     Musculoskeletal: Neck supple.  Cardiovascular:     Rate and Rhythm: Normal rate and regular rhythm.  Pulmonary:     Effort: Pulmonary effort is normal. No respiratory distress.     Breath sounds: Normal breath sounds. No wheezing, rhonchi or rales.  Abdominal:     General: There is no distension.     Palpations: Abdomen is soft.     Tenderness: There is no abdominal tenderness. There is no right CVA tenderness, left CVA tenderness, guarding or rebound. Negative  signs include Murphy's sign and McBurney's sign.  Skin:    General: Skin is warm and dry.     Findings: No rash.  Neurological:     Mental Status: She is alert.     Comments: Clear speech.   Psychiatric:        Behavior: Behavior normal.    ED Treatments / Results  Labs (all labs ordered are listed, but only abnormal results are displayed) Labs Reviewed  COMPREHENSIVE METABOLIC PANEL - Abnormal; Notable for the following components:      Result Value   Glucose, Bld 101 (*)    Creatinine, Ser 1.01 (*)    All other components within normal limits  URINALYSIS, ROUTINE W REFLEX MICROSCOPIC - Abnormal; Notable for the following components:   APPearance HAZY (*)    Hgb urine dipstick LARGE (*)    Protein, ur 30 (*)    Leukocytes,Ua SMALL (*)    Bacteria, UA RARE (*)    All other components within normal limits  LIPASE, BLOOD  CBC  I-STAT BETA HCG BLOOD, ED ([facility], [facility], [facility] ONLY)    EKG None  Radiology No results found.  Procedures Procedures (including critical care time)  Medications Ordered in ED Medications  sodium chloride flush (NS) 0.9 % injection 3 mL (has no administration in time range)     Initial Impression / Assessment and Plan / ED Course  I have reviewed the triage vital signs and the nursing notes.  Pertinent labs & imaging results that were available during my care of the patient were reviewed by me and considered in my medical decision making (see chart for details).    Patient presents to the ED with complaints of 1 episode of emesis s/p drinking alcohol last night, currently feeling much better and has tolerated PO in ED waiting room. Patient nontoxic appearing, in no apparent distress, vitals WNL other than elevated BP- doubt HTN emergency- will need PCP recheck. Nontender abdomen without peritoneal signs.   ER work-up per triage reviewed:  CBC: No anemia or leukocytosis.  CMP: Mildly elevated creatinine similar to prior. No electrolyte derangement.  LFTs WNL.  Lipase: WNL.  UA: hematuria consistent with history of menses.  Preg test: Negative  Abdominal exam without peritoneal signs, doubt cholecystitis, pancreatitis, diverticulitis, appendicitis, bowel obstruction/perforation, PID, or ectopic pregnancy. Patient tolerating PO in the emergency department. Will discharge home with supportive measures. I discussed results, treatment plan, need for PCP follow-up, and return precautions with the patient. Provided opportunity for questions, patient confirmed understanding and is in agreement with plan.   DLORAH COSSAIRT was evaluated in Emergency Department on 08/17/2019 for the symptoms described in the history of present illness. He/she was evaluated in the context of the global COVID-19 pandemic, which necessitated consideration that the patient might be at risk for infection with the SARS-CoV-2 virus that causes COVID-19. Institutional protocols and algorithms that pertain to the evaluation of patients at risk for COVID-19 are in a state of rapid change based on information released by regulatory bodies including the CDC and federal and state organizations. These policies and algorithms were followed during the patient's care in the ED.   Final Clinical Impressions(s) / ED Diagnoses   Final diagnoses:  Non-intractable vomiting with nausea, unspecified vomiting type  Hypertension, unspecified type    ED Discharge Orders         Ordered    ondansetron (ZOFRAN ODT) 4 MG disintegrating tablet  Every 8 hours PRN     08/17/19 1715           Kairav Russomanno, [place] R, PA-C 08/17/19 1719    Drenda Freeze, MD 08/18/19 [phone]

## 2019-08-17 NOTE — ED Triage Notes (Signed)
Pt states that her work told her to come in, as she through up at work. Pt states that she was out drinking last night and believes this to be a hangover.

## 2019-08-17 NOTE — Discharge Instructions (Signed)
You were seen in the emergency department today for an episode of vomiting.  Your work-up was overall reassuring.  Your creatinine, a measure of kidney function was mildly elevated similar to prior- please continue to have your primary care provider monitor this.   Your blood pressure is elevated- you need to have this rechecked within 1 week as you may need adjustment in blood pressure medicines.   We are sending you home with zofran to take every 8 hours as needed for nausea/vomiting.   We have prescribed you new medication(s) today. Discuss the medications prescribed today with your pharmacist as they can have adverse effects and interactions with your other medicines including over the counter and prescribed medications. Seek medical evaluation if you start to experience new or abnormal symptoms after taking one of these medicines, seek care immediately if you start to experience difficulty breathing, feeling of your throat closing, facial swelling, or rash as these could be indications of a more serious allergic reaction  Please follow up with primary care within 1 week. Return to the ER for new or worsening symptoms including but not limited to inability to keep fluids down, abdominal pain, blood in vomit/stool, fever, or any other concerns.

## 2019-12-03 ENCOUNTER — Ambulatory Visit (INDEPENDENT_AMBULATORY_CARE_PROVIDER_SITE_OTHER): Payer: HRSA Program

## 2019-12-03 ENCOUNTER — Other Ambulatory Visit: Payer: Self-pay

## 2019-12-03 ENCOUNTER — Ambulatory Visit (HOSPITAL_COMMUNITY)
Admission: EM | Admit: 2019-12-03 | Discharge: 2019-12-03 | Disposition: A | Discharge status: Home / Self Care | Payer: HRSA Program | Attending: Emergency Medicine | Admitting: Emergency Medicine

## 2019-12-03 ENCOUNTER — Encounter (HOSPITAL_COMMUNITY): Payer: Self-pay

## 2019-12-03 DIAGNOSIS — F17290 Nicotine dependence, other tobacco product, uncomplicated: Secondary | ICD-10-CM | POA: Insufficient documentation

## 2019-12-03 DIAGNOSIS — U071 COVID-19: Secondary | ICD-10-CM | POA: Insufficient documentation

## 2019-12-03 DIAGNOSIS — R05 Cough: Secondary | ICD-10-CM

## 2019-12-03 DIAGNOSIS — Z880 Allergy status to penicillin: Secondary | ICD-10-CM | POA: Insufficient documentation

## 2019-12-03 DIAGNOSIS — Z8249 Family history of ischemic heart disease and other diseases of the circulatory system: Secondary | ICD-10-CM | POA: Insufficient documentation

## 2019-12-03 DIAGNOSIS — Z3202 Encounter for pregnancy test, result negative: Secondary | ICD-10-CM

## 2019-12-03 DIAGNOSIS — Z79899 Other long term (current) drug therapy: Secondary | ICD-10-CM | POA: Diagnosis not present

## 2019-12-03 DIAGNOSIS — J069 Acute upper respiratory infection, unspecified: Secondary | ICD-10-CM | POA: Diagnosis present

## 2019-12-03 LAB — POCT URINALYSIS DIP (DEVICE)
Bilirubin Urine: NEGATIVE
Glucose, UA: NEGATIVE mg/dL
Hgb urine dipstick: NEGATIVE
Ketones, ur: NEGATIVE mg/dL
Leukocytes,Ua: NEGATIVE
Nitrite: NEGATIVE
Protein, ur: NEGATIVE mg/dL
Specific Gravity, Urine: 1.01 (ref 1.005–1.030)
Urobilinogen, UA: 0.2 mg/dL (ref 0.0–1.0)
pH: 7 (ref 5.0–8.0)

## 2019-12-03 LAB — POC URINE PREG, ED: Preg Test, Ur: NEGATIVE

## 2019-12-03 LAB — POCT PREGNANCY, URINE: Preg Test, Ur: NEGATIVE

## 2019-12-03 LAB — SARS CORONAVIRUS 2 (TAT 6-24 HRS): SARS Coronavirus 2: POSITIVE — AB

## 2019-12-03 MED ORDER — ALBUTEROL SULFATE HFA 108 (90 BASE) MCG/ACT IN AERS: 2.0000 | INHALATION_SPRAY | Freq: Four times a day (QID) | RESPIRATORY_TRACT | 0 refills | Status: DC | PRN

## 2019-12-03 MED ORDER — BENZONATATE 200 MG PO CAPS: 200.0000 mg | ORAL_CAPSULE | Freq: Three times a day (TID) | ORAL | 0 refills | Status: DC | PRN

## 2019-12-03 MED ORDER — IBUPROFEN 600 MG PO TABS: 600.0000 mg | ORAL_TABLET | Freq: Four times a day (QID) | ORAL | 0 refills | Status: DC | PRN

## 2019-12-03 MED ORDER — FLUTICASONE PROPIONATE 50 MCG/ACT NA SUSP: 2.0000 | Freq: Every day | NASAL | 0 refills | Status: DC

## 2019-12-03 MED ORDER — AEROCHAMBER PLUS MISC: 2 refills | Status: DC

## 2019-12-03 NOTE — ED Provider Notes (Signed)
HPI  SUBJECTIVE:  Marissa Meyer is a 39 y.o. female who presents with 3 to 4 days of a nonproductive cough.  She reports nasal congestion wheezing.  She states that she has felt feverish with chills but has not measured her temp at home.  No body aches.  No new or different headaches no sinus pain or pressure.  No postnasal drip sore throat shortness of breath.  No nausea vomiting diarrhea abdominal pain.  No known Covid exposure.  No flu or Covid vaccination.  No antibiotics in the past 3 months.  She states that she has bilateral diffuse lower sharp sore rib pain which is intermittent, present only with deep breathing along her lower ribs for the past 2 days.  She tried TheraFlu Alka-Seltzer cold.  Last dose of TheraFlu was within 4 to 6 hours of evaluation.  No aggravating factors.  She has a past medical history of hypertension, she is a smoker, migraines.  States that she has pneumonia and/or bronchitis every year around this time of year.  No history of pulmonary disease diabetes coronary artery disease chronic kidney disease HIV cancer immunocompromise.  LMP: 2/28.  Not sure if she could be pregnant.  PMD: [place]    Past Medical History:  Diagnosis Date  . Anemia   . Chlamydia   . Ectopic pregnancy   . Fibroid   . Gonorrhea   . Hypertension   . Mental disorder   . Migraines    but not diagnosed  . Pregnancy induced hypertension   . Preterm delivery   . Trichomonas     Past Surgical History:  Procedure Laterality Date  . CESAREAN SECTION N/A 03/21/2014   Procedure: CESAREAN SECTION;  Surgeon: Osborne Oman, MD;  Location: [facility];  Service: Obstetrics;  Laterality: N/A;  . DILATION AND CURETTAGE OF UTERUS    . ECTOPIC PREGNANCY SURGERY      Family History  Problem Relation Age of Onset  . Hypertension Mother   . Hypertension Maternal Grandmother   . Cancer Maternal Grandfather   . Hypertension Father     Social History   Tobacco Use  . Smoking status: Current Every  Day Smoker    Packs/day: 0.25    Types: Cigars    Last attempt to quit: 08/13/2013    Years since quitting: 6.3  . Smokeless tobacco: Never Used  . Tobacco comment: black and milds; 2-3 per day  Substance Use Topics  . Alcohol use: No  . Drug use: Yes    Frequency: 1.0 times per week    Types: Marijuana    No current facility-administered medications for this encounter.  Current Outpatient Medications:  .  albuterol (VENTOLIN HFA) 108 (90 Base) MCG/ACT inhaler, Inhale 2 puffs into the lungs every 6 (six) hours as needed for wheezing or shortness of breath., Disp: 18 g, Rfl: 0 .  benzonatate (TESSALON) 200 MG capsule, Take 1 capsule (200 mg total) by mouth 3 (three) times daily as needed for cough., Disp: 30 capsule, Rfl: 0 .  fluticasone (FLONASE) 50 MCG/ACT nasal spray, Place 2 sprays into both nostrils daily., Disp: 16 g, Rfl: 0 .  ibuprofen (ADVIL) 600 MG tablet, Take 1 tablet (600 mg total) by mouth every 6 (six) hours as needed., Disp: 30 tablet, Rfl: 0 .  Spacer/Aero-Holding Chambers (AEROCHAMBER PLUS) inhaler, Use as instructed, Disp: 1 each, Rfl: 2  Allergies  Allergen Reactions  . Amoxicillin Hives    Has patient had a PCN reaction causing  immediate rash, facial/tongue/throat swelling, SOB or lightheadedness with hypotension: Yes Has patient had a PCN reaction causing severe rash involving mucus membranes or skin necrosis: No Has patient had a PCN reaction that required hospitalization No Has patient had a PCN reaction occurring within the last 10 years: Yes If all of the above answers are "NO", then may proceed with Cephalosporin use.     ROS  As noted in HPI.   Physical Exam  BP (!) 126/96 (BP Location: Left Arm)   Pulse (!) 102   Temp 99.6 F (37.6 C) (Oral)   Resp 20   LMP 11/10/2019   SpO2 100%   Breastfeeding No Comment: Negative pregnancy test prior to CXR  Constitutional: Well developed, well nourished, no acute distress Eyes:  EOMI, conjunctiva  normal bilaterally HENT: Normocephalic, atraumatic,mucus membranes moist.  No nasal congestion.  No maxillary or frontal sinus tenderness.  Unable to adequately visualize oropharynx. Respiratory: Normal inspiratory effort lungs clear bilaterally good air movement.  Lateral chest wall tenderness Cardiovascular: Mild regular tachycardia no murmurs rubs or gallops GI: nondistended skin: No rash, skin intact Musculoskeletal: no deformities Neurologic: Alert & oriented x 3, no focal neuro deficits Psychiatric: Speech and behavior appropriate   ED Course   Medications - No data to display  Orders Placed This Encounter  Procedures  . SARS CORONAVIRUS 2 (TAT 6-24 HRS) Nasopharyngeal Nasopharyngeal Swab    Standing Status:   Standing    Number of Occurrences:   1    Order Specific Question:   Is this test for diagnosis or screening    Answer:   Diagnosis of ill patient    Order Specific Question:   Symptomatic for COVID-19 as defined by CDC    Answer:   Yes    Order Specific Question:   Date of Symptom Onset    Answer:   11/29/2019    Order Specific Question:   Hospitalized for COVID-19    Answer:   Yes    Order Specific Question:   Admitted to ICU for COVID-19    Answer:   No    Order Specific Question:   Previously tested for COVID-19    Answer:   No    Order Specific Question:   Resident in a congregate (group) care setting    Answer:   No    Order Specific Question:   Employed in healthcare setting    Answer:   No    Order Specific Question:   Pregnant    Answer:   No  . DG Chest 2 View    Standing Status:   Standing    Number of Occurrences:   1    Order Specific Question:   Reason for Exam (SYMPTOM  OR DIAGNOSIS REQUIRED)    Answer:   cough fever r/o pNA bronchitis  . POC urine pregnancy    Standing Status:   Standing    Number of Occurrences:   1  . POCT urinalysis dip (device)    Standing Status:   Standing    Number of Occurrences:   1  . Pregnancy, urine POC     Standing Status:   Standing    Number of Occurrences:   1    Results for orders placed or performed during the hospital encounter of 12/03/19 (from the past 24 hour(s))  POCT urinalysis dip (device)     Status: None   Collection Time: 12/03/19  4:18 PM  Result Value Ref Range   Glucose,  UA NEGATIVE NEGATIVE mg/dL   Bilirubin Urine NEGATIVE NEGATIVE   Ketones, ur NEGATIVE NEGATIVE mg/dL   Specific Gravity, Urine 1.010 1.005 - 1.030   Hgb urine dipstick NEGATIVE NEGATIVE   pH 7.0 5.0 - 8.0   Protein, ur NEGATIVE NEGATIVE mg/dL   Urobilinogen, UA 0.2 0.0 - 1.0 mg/dL   Nitrite NEGATIVE NEGATIVE   Leukocytes,Ua NEGATIVE NEGATIVE  POC urine pregnancy     Status: None   Collection Time: 12/03/19  4:26 PM  Result Value Ref Range   Preg Test, Ur NEGATIVE NEGATIVE  Pregnancy, urine POC     Status: None   Collection Time: 12/03/19  4:26 PM  Result Value Ref Range   Preg Test, Ur NEGATIVE NEGATIVE   DG Chest 2 View  Result Date: 12/03/2019 CLINICAL DATA:  Cough and fever EXAM: CHEST - 2 VIEW COMPARISON:  November 21, 2018. FINDINGS: The lungs are clear. The heart size and pulmonary vascularity are normal. No adenopathy. No bone lesions. Note that there is slight lower thoracic dextroscoliosis. IMPRESSION: Lungs clear.  Cardiac silhouette normal. Electronically Signed   By: Lowella Grip III M.D.   On: 12/03/2019 16:44    ED Clinical Impression  1. Viral URI with cough      ED Assessment/Plan  Checking urine pregnancy as patient is not sure if she could be pregnant, UA.  Will check chest x-ray to rule a bronchitis, pneumonia although her lungs are clear.  She is tachycardic.  No respiratory distress satting well on room air afebrile, but she took an antipyretic within 4 to 6 hours of evaluation.  Covid PCR sent.  Will reevaluate  UA, urine pregnancy negative.  Reviewed imaging independently.  Normal chest x-ray. see radiology report for full details.  Patient with cough, most likely  viral.  Does not appear to be sinusitis.  There is no evidence of pneumonia or bronchitis on chest x-ray.  Covid test pending.  Will send home with albuterol inhaler with a spacer on a regular basis 2 puffs every 4 hours for 2 days, 2 puffs every 6 hours for 2 days, then as needed for the wheezing.  Tessalon as needed for the cough.  Saline nasal irrigation Flonase ibuprofen 600 mg combined with 1000 mg of Tylenol 3-4 times a day for the chest wall soreness.  If not better in 5 or 6 days, will call in a prescription of antibiotics, such as doxycycline.  She has a penicillin allergy.  Follow-up with PMD at [facility], to the ER if she gets worse  Discussed imaging, MDM, treatment plan, and plan for follow-up with patient. Discussed sn/sx that should prompt return to the ED. patient agrees with plan.   Meds ordered this encounter  Medications  . fluticasone (FLONASE) 50 MCG/ACT nasal spray    Sig: Place 2 sprays into both nostrils daily.    Dispense:  16 g    Refill:  0  . ibuprofen (ADVIL) 600 MG tablet    Sig: Take 1 tablet (600 mg total) by mouth every 6 (six) hours as needed.    Dispense:  30 tablet    Refill:  0  . albuterol (VENTOLIN HFA) 108 (90 Base) MCG/ACT inhaler    Sig: Inhale 2 puffs into the lungs every 6 (six) hours as needed for wheezing or shortness of breath.    Dispense:  18 g    Refill:  0  . benzonatate (TESSALON) 200 MG capsule    Sig: Take 1 capsule (200 mg  total) by mouth 3 (three) times daily as needed for cough.    Dispense:  30 capsule    Refill:  0  . Spacer/Aero-Holding Chambers (AEROCHAMBER PLUS) inhaler    Sig: Use as instructed    Dispense:  1 each    Refill:  2    *This clinic note was created using Dragon dictation software. Therefore, there may be occasional mistakes despite careful proofreading.   ?    Melynda Ripple, MD 12/03/19 [id number]

## 2019-12-03 NOTE — ED Triage Notes (Signed)
Pt reports she had some sneezing and a sore throat then turned into intermittent cough and chills x 3-4 days.  Took Alka-Seltzer and Theraflu at home.  Some relief with Theraflu.  Reports some pain in ribs.

## 2019-12-03 NOTE — Discharge Instructions (Addendum)
There is no evidence of pneumonia or bronchitis on chest x-ray.  Your Covid test is pending will be back in a day or 2.  Use your albuterol inhaler with a spacer on a regular basis for the next 4 days.  2 puffs every 4 hours for 2 days, 2 puffs every 6 hours for 2 days, then as needed for the wheezing.  Tessalon as needed for the cough.  Saline nasal irrigation with a Milta Deiters med rinse and distilled water as often as you want Flonase for the nasal congestion , and ibuprofen 600 mg combined with 1000 mg of Tylenol 3-4 times a day for the chest wall soreness.  If not better in 5 or 6 days, will call in a prescription of antibiotics.  Or you can follow-up with your primary care physician on [place].

## 2019-12-04 ENCOUNTER — Telehealth: Payer: Self-pay | Admitting: Physician Assistant

## 2019-12-04 ENCOUNTER — Telehealth (HOSPITAL_COMMUNITY): Payer: Self-pay

## 2019-12-04 NOTE — Telephone Encounter (Signed)
Patient contacted by phone and made aware of  COVID results. Pt verbalized understanding and had all questions answered.  Your test for COVID-19 was positive, meaning that you were infected with the novel coronavirus and could give the germ to others.  Please continue isolation at home for at least 10 days since the start of your symptoms. If you do not have symptoms, please isolate at home for 10 days from the day you were tested. Once you complete your 10 day quarantine, you may return to normal activities as long as you've not had a fever for over 24 hours(without taking fever reducing medicine) and your symptoms are improving. Please continue good preventive care measures, including:  frequent hand-washing, avoid touching your face, cover coughs/sneezes, stay out of crowds and keep a 6 foot distance from others.  Go to the nearest hospital emergency room if fever/cough/breathlessness are severe or illness seems like a threat to life.

## 2019-12-04 NOTE — Telephone Encounter (Signed)
Called to discuss with [name] about Covid symptoms and the use of bamlanivimab, a monoclonal antibody infusion for those with mild to moderate Covid symptoms and at a high risk of hospitalization.     Pt is not qualified for this infusion due to lack of identified risk factors and co-morbid conditions.  Symptoms reviewed as well as criteria for ending isolation.  Symptoms reviewed that would warrant ED/Hospital evaluation as well should her condition worsen. Preventative practices reviewed. Patient verbalized understanding.   Leanor Kail, PA - C

## 2020-03-02 ENCOUNTER — Encounter (HOSPITAL_COMMUNITY): Payer: Self-pay

## 2020-03-02 ENCOUNTER — Other Ambulatory Visit: Payer: Self-pay

## 2020-03-02 ENCOUNTER — Ambulatory Visit (HOSPITAL_COMMUNITY)
Admission: EM | Admit: 2020-03-02 | Discharge: 2020-03-02 | Disposition: A | Discharge status: Home / Self Care | Payer: Self-pay | Attending: Family Medicine | Admitting: Family Medicine

## 2020-03-02 DIAGNOSIS — K047 Periapical abscess without sinus: Secondary | ICD-10-CM

## 2020-03-02 MED ORDER — TRAMADOL HCL 50 MG PO TABS: 50.0000 mg | ORAL_TABLET | Freq: Four times a day (QID) | ORAL | 0 refills | Status: DC | PRN

## 2020-03-02 MED ORDER — LIDOCAINE HCL (PF) 1 % IJ SOLN
INTRAMUSCULAR | Status: AC
  Filled 2020-03-02: qty 2

## 2020-03-02 MED ORDER — CLINDAMYCIN HCL 300 MG PO CAPS: 300.0000 mg | ORAL_CAPSULE | Freq: Three times a day (TID) | ORAL | 0 refills | Status: AC

## 2020-03-02 MED ORDER — CEFTRIAXONE SODIUM 1 G IJ SOLR
1.0000 g | Freq: Once | INTRAMUSCULAR | Status: AC
  Administered 2020-03-02: 1 g via INTRAMUSCULAR

## 2020-03-02 MED ORDER — CEFTRIAXONE SODIUM 500 MG IJ SOLR
INTRAMUSCULAR | Status: AC
  Filled 2020-03-02: qty 500

## 2020-03-02 MED ORDER — IBUPROFEN 800 MG PO TABS: 800.0000 mg | ORAL_TABLET | Freq: Three times a day (TID) | ORAL | 0 refills | Status: DC

## 2020-03-02 NOTE — Discharge Instructions (Signed)

## 2020-03-02 NOTE — ED Triage Notes (Signed)
C/o abscess that appeared last night on her bottom lip.

## 2020-03-03 NOTE — ED Provider Notes (Signed)
[facility]    CSN: [id number] Arrival date & time: 03/02/20  1905      History   Chief Complaint Chief Complaint  Patient presents with  . Abscess    HPI Marissa Meyer is a 39 y.o. female presenting today for evaluation of abscess.  Patient has developed abscess to her right lower jaw over the past 24-48 hours.  Reports that she has a bad tooth in this area.  Has had a lot of pain.  Does report some discomfort with moving neck, but denies stiffness.  Also has pain with swallowing, but is able to swallow and denies throat swelling.  Denies fevers.  HPI  Past Medical History:  Diagnosis Date  . Anemia   . Chlamydia   . Ectopic pregnancy   . Fibroid   . Gonorrhea   . Hypertension   . Mental disorder   . Migraines    but not diagnosed  . Pregnancy induced hypertension   . Preterm delivery   . Trichomonas     Patient Active Problem List   Diagnosis Date Noted  . Gestational hypertension 09/19/2015  . Chronic hypertension with exacerbation during pregnancy 09/19/2015  . No prenatal care in current pregnancy in third trimester 09/19/2015  . Pregnancy with adoption planned, currently in third trimester 09/19/2015  . Cocaine abuse affecting pregnancy in third trimester ([place]) 03/14/2014  . Tobacco use in pregnancy, antepartum 02/20/2014    Past Surgical History:  Procedure Laterality Date  . CESAREAN SECTION N/A 03/21/2014   Procedure: CESAREAN SECTION;  Surgeon: Osborne Oman, MD;  Location: [facility];  Service: Obstetrics;  Laterality: N/A;  . DILATION AND CURETTAGE OF UTERUS    . ECTOPIC PREGNANCY SURGERY      OB History    Gravida  5   Para  4   Term  0   Preterm  4   AB  1   Living  3     SAB      TAB      Ectopic  1   Multiple  0   Live Births  3            Home Medications    Prior to Admission medications   Medication Sig Start Date End Date Taking? Authorizing Provider  albuterol (VENTOLIN HFA) 108 (90 Base) MCG/ACT  inhaler Inhale 2 puffs into the lungs every 6 (six) hours as needed for wheezing or shortness of breath. 12/03/19   Melynda Ripple, MD  benzonatate (TESSALON) 200 MG capsule Take 1 capsule (200 mg total) by mouth 3 (three) times daily as needed for cough. 12/03/19   Melynda Ripple, MD  clindamycin (CLEOCIN) 300 MG capsule Take 1 capsule (300 mg total) by mouth 3 (three) times daily for 7 days. 03/02/20 03/09/20  Bellamy Rubey C, PA-C  fluticasone (FLONASE) 50 MCG/ACT nasal spray Place 2 sprays into both nostrils daily. 12/03/19   Melynda Ripple, MD  ibuprofen (ADVIL) 800 MG tablet Take 1 tablet (800 mg total) by mouth 3 (three) times daily. 03/02/20   Devion Chriscoe, Madelynn Done C, PA-C  Spacer/Aero-Holding Chambers (AEROCHAMBER PLUS) inhaler Use as instructed 12/03/19   Melynda Ripple, MD  traMADol (ULTRAM) 50 MG tablet Take 1 tablet (50 mg total) by mouth every 6 (six) hours as needed for severe pain. 03/02/20   Marcos Ruelas C, PA-C  amLODipine (NORVASC) 5 MG tablet Take 1 tablet (5 mg total) by mouth daily. Patient not taking: Reported on 08/17/2019 08/15/18 08/17/19  Kenton Kingfisher,  Carroll Sage, FNP  atorvastatin (LIPITOR) 20 MG tablet Take 1 tablet (20 mg total) by mouth daily. Patient not taking: Reported on 08/17/2019 08/22/18 08/17/19  Scot Jun, FNP  hydrochlorothiazide (HYDRODIURIL) 25 MG tablet Take 1 tablet (25 mg total) by mouth daily. Patient not taking: Reported on 08/17/2019 04/11/19 08/17/19  Mesner, Corene Cornea, MD    Family History Family History  Problem Relation Age of Onset  . Hypertension Mother   . Hypertension Maternal Grandmother   . Cancer Maternal Grandfather   . Hypertension Father     Social History Social History   Tobacco Use  . Smoking status: Current Every Day Smoker    Packs/day: 0.25    Types: Cigars    Last attempt to quit: 08/13/2013    Years since quitting: 6.5  . Smokeless tobacco: Never Used  . Tobacco comment: black and milds; 2-3 per day  Vaping Use  .  Vaping Use: Never used  Substance Use Topics  . Alcohol use: No  . Drug use: Yes    Frequency: 1.0 times per week    Types: Marijuana     Allergies   Amoxicillin   Review of Systems Review of Systems  Constitutional: Negative for activity change, appetite change, chills, fatigue and fever.  HENT: Positive for dental problem and facial swelling. Negative for congestion, ear pain, rhinorrhea, sinus pressure, sore throat and trouble swallowing.   Eyes: Negative for discharge and redness.  Respiratory: Negative for cough, chest tightness and shortness of breath.   Cardiovascular: Negative for chest pain.  Gastrointestinal: Negative for abdominal pain, diarrhea, nausea and vomiting.  Musculoskeletal: Negative for myalgias.  Skin: Negative for rash.  Neurological: Negative for dizziness, light-headedness and headaches.     Physical Exam Triage Vital Signs ED Triage Vitals  Enc Vitals Group     BP 03/02/20 2017 (!) 160/92     Pulse Rate 03/02/20 2017 89     Resp 03/02/20 2017 13     Temp 03/02/20 2017 98.7 F (37.1 C)     Temp Source 03/02/20 2017 Oral     SpO2 03/02/20 2017 100 %     Weight --      Height --      Head Circumference --      Peak Flow --      Pain Score 03/02/20 2016 10     Pain Loc --      Pain Edu? --      Excl. in [place]? --    No data found.  Updated Vital Signs BP (!) 160/92 (BP Location: Right Arm)   Pulse 89   Temp 98.7 F (37.1 C) (Oral)   Resp 13   SpO2 100%   Visual Acuity Right Eye Distance:   Left Eye Distance:   Bilateral Distance:    Right Eye Near:   Left Eye Near:    Bilateral Near:     Physical Exam Vitals and nursing note reviewed.  Constitutional:      Appearance: She is well-developed.     Comments: No acute distress  HENT:     Head: Normocephalic and atraumatic.     Comments: Right lower jaw with significant swelling, does not extend below submandibular line or around chin area    Ears:     Comments: Bilateral ears  without tenderness to palpation of external auricle, tragus and mastoid, EAC's without erythema or swelling, TM's with good bony landmarks and cone of light. Non erythematous.  Nose: Nose normal.     Mouth/Throat:     Comments: Swelling and tenderness to palpation and right lower gingival area, posterior pharynx patent without soft palate swelling, uvula midline, no soft palate swelling below tongue or tenderness to palpation below tongue Eyes:     Conjunctiva/sclera: Conjunctivae normal.  Neck:     Comments: No overlying neck swelling or erythema, full active range of motion of neck Cardiovascular:     Rate and Rhythm: Normal rate.  Pulmonary:     Effort: Pulmonary effort is normal. No respiratory distress.  Abdominal:     General: There is no distension.  Musculoskeletal:        General: Normal range of motion.     Cervical back: Neck supple.  Skin:    General: Skin is warm and dry.  Neurological:     Mental Status: She is alert and oriented to person, place, and time.      UC Treatments / Results  Labs (all labs ordered are listed, but only abnormal results are displayed) Labs Reviewed - No data to display  EKG   Radiology No results found.  Procedures Procedures (including critical care time)  Medications Ordered in UC Medications  cefTRIAXone (ROCEPHIN) injection 1 g (1 g Intramuscular Given 03/02/20 2050)    Initial Impression / Assessment and Plan / UC Course  I have reviewed the triage vital signs and the nursing notes.  Pertinent labs & imaging results that were available during my care of the patient were reviewed by me and considered in my medical decision making (see chart for details).     Dental abscess present, does not extend below submandibular line, no sign of deep space abscess or Ludwig's angina, treating with Rocephin IM prior to discharge and continuing on clindamycin.  Tylenol and ibuprofen for pain.  Tramadol for severe pain.  Advised  patient to follow-up in emergency room if not seeing any improvement with the prescribed medicines in the next 24 to 48 hours or worsening.  Discussed strict return precautions. Patient verbalized understanding and is agreeable with plan.  Final Clinical Impressions(s) / UC Diagnoses   Final diagnoses:  Dental abscess     Discharge Instructions     Please use dental resource to contact offices to seek permenant treatment/relief.   Today we have given you an antibiotic. This should help with pain as any infection is cleared.   For pain please take 600mg -800mg  of Ibuprofen every 8 hours, take with 1000 mg of Tylenol Extra strength every 8 hours. These are safe to take together. Please take with food.   I have also provided 2 days worth of stronger pain medication. This should only be used for severe pain. Do not drive or operate machinery while taking this medication.   Please return if you start to experience significant swelling of your face, experiencing fever.   ED Prescriptions    Medication Sig Dispense Auth. Provider   clindamycin (CLEOCIN) 300 MG capsule Take 1 capsule (300 mg total) by mouth 3 (three) times daily for 7 days. 21 capsule Lucendia Leard C, PA-C   ibuprofen (ADVIL) 800 MG tablet Take 1 tablet (800 mg total) by mouth 3 (three) times daily. 21 tablet Samuele Storey C, PA-C   traMADol (ULTRAM) 50 MG tablet Take 1 tablet (50 mg total) by mouth every 6 (six) hours as needed for severe pain. 12 tablet Vee Bahe, [place] C, PA-C     I have reviewed the PDMP during this encounter.  Deshawna Mcneece, [place] C, PA-C 03/03/20 1045

## 2020-04-28 ENCOUNTER — Other Ambulatory Visit: Payer: Self-pay

## 2020-04-28 ENCOUNTER — Encounter (HOSPITAL_COMMUNITY): Payer: Self-pay

## 2020-04-28 ENCOUNTER — Ambulatory Visit (HOSPITAL_COMMUNITY)
Admission: EM | Admit: 2020-04-28 | Discharge: 2020-04-28 | Disposition: A | Discharge status: Home / Self Care | Payer: HRSA Program | Attending: Urgent Care | Admitting: Urgent Care

## 2020-04-28 ENCOUNTER — Other Ambulatory Visit: Payer: Self-pay | Admitting: Urgent Care

## 2020-04-28 DIAGNOSIS — J069 Acute upper respiratory infection, unspecified: Secondary | ICD-10-CM

## 2020-04-28 DIAGNOSIS — I10 Essential (primary) hypertension: Secondary | ICD-10-CM | POA: Diagnosis not present

## 2020-04-28 DIAGNOSIS — Z79899 Other long term (current) drug therapy: Secondary | ICD-10-CM | POA: Diagnosis not present

## 2020-04-28 DIAGNOSIS — Z8616 Personal history of COVID-19: Secondary | ICD-10-CM | POA: Insufficient documentation

## 2020-04-28 DIAGNOSIS — Z20822 Contact with and (suspected) exposure to covid-19: Secondary | ICD-10-CM | POA: Insufficient documentation

## 2020-04-28 DIAGNOSIS — R0602 Shortness of breath: Secondary | ICD-10-CM

## 2020-04-28 DIAGNOSIS — R030 Elevated blood-pressure reading, without diagnosis of hypertension: Secondary | ICD-10-CM

## 2020-04-28 DIAGNOSIS — R05 Cough: Secondary | ICD-10-CM | POA: Insufficient documentation

## 2020-04-28 DIAGNOSIS — F17210 Nicotine dependence, cigarettes, uncomplicated: Secondary | ICD-10-CM | POA: Insufficient documentation

## 2020-04-28 DIAGNOSIS — F17200 Nicotine dependence, unspecified, uncomplicated: Secondary | ICD-10-CM

## 2020-04-28 HISTORY — DX: COVID-19: U07.1

## 2020-04-28 LAB — SARS CORONAVIRUS 2 (TAT 6-24 HRS): SARS Coronavirus 2: NEGATIVE

## 2020-04-28 MED ORDER — PROMETHAZINE-DM 6.25-15 MG/5ML PO SYRP: 5.0000 mL | ORAL_SOLUTION | Freq: Every evening | ORAL | 0 refills | Status: DC | PRN

## 2020-04-28 MED ORDER — AMLODIPINE BESYLATE 5 MG PO TABS: 5.0000 mg | ORAL_TABLET | Freq: Every day | ORAL | 0 refills | Status: DC

## 2020-04-28 MED ORDER — BENZONATATE 100 MG PO CAPS: 100.0000 mg | ORAL_CAPSULE | Freq: Three times a day (TID) | ORAL | 0 refills | Status: DC | PRN

## 2020-04-28 MED ORDER — ALBUTEROL SULFATE HFA 108 (90 BASE) MCG/ACT IN AERS: 2.0000 | INHALATION_SPRAY | Freq: Four times a day (QID) | RESPIRATORY_TRACT | 0 refills | Status: DC | PRN

## 2020-04-28 NOTE — Discharge Instructions (Addendum)
We will notify you of your COVID-19 test results as they arrive and may take between 24 to 48 hours.  I encourage you to sign up for MyChart if you have not already done so as this can be the easiest way for [place] to communicate results to you online or through a phone app.  In the meantime, if you develop worsening symptoms including fever, chest pain, shortness of breath despite our current treatment plan then please report to the emergency room as this may be a sign of worsening status from possible COVID-19 infection.  Otherwise, we will manage this as a viral syndrome. For sore throat or cough try using a honey-based tea. Use 3 teaspoons of honey with juice squeezed from half lemon. Place shaved pieces of ginger into 1/2-1 cup of water and warm over stove top. Then mix the ingredients and repeat every 4 hours as needed. Please take Tylenol 500mg -650mg  every 6 hours for aches and pains, fevers. Hydrate very well with at least 2 liters of water. Eat light meals such as soups to replenish electrolytes and soft fruits, veggies. Start an antihistamine like Zyrtec, Allegra or Claritin for postnasal drainage, sinus congestion.     For diabetes or elevated blood sugar, please make sure you are avoiding starchy, carbohydrate foods like pasta, breads, pastry, rice, potatoes, desserts. These foods can elevated your blood sugar. Also, avoid sodas, sweet teas, sugary beverages, fruit juices.  Drinking plain water will be much more helpful, try 64 ounces of water daily.  It is okay to flavor your water naturally by cutting cucumber, lemon, mint or lime, placing it in a picture with water and drinking it over a period of 2 to 3 days as long as it remains refrigerated.    For elevated blood pressure, make sure you are monitoring salt in your diet.  Do not eat restaurant foods and limit processed foods at home, prepare/cook your own foods at home.  Processed foods include things like frozen meals preseasoned meats and  dinners, deli meats, canned foods as they are high in sodium/salt.  Make sure your pain attention to sodium labels on foods you by at the grocery store.  For seasoning you can use a brand called Mrs. Dash which includes a lot of salt free seasonings.  Salads - kale, spinach, cabbage, spring mix; use seeds like pumpkin seeds or sunflower seeds, almonds, walnuts or pecans; you can also use 1-2 hard boiled eggs in your salads Fruits - avocadoes, berries (blueberries, raspberries, blackberries), apples, oranges, pomegranate, pear; avoid eating bananas, grapes regularly Vegetables - aspargus, cauliflower, broccoli, green beans, brussel spouts, bell peppers; stay away from starchy vegetables like potatoes, carrots, peas  Regarding meat it is better to eat lean meats and limit your red meat including pork to once a week.  Wild caught fish, chicken breast are good options as they tend to be leaner sources of good protein.   DO NOT EAT ANY FOODS ON THIS LIST THAT YOU ARE ALLERGIC TO.

## 2020-04-28 NOTE — ED Provider Notes (Signed)
[place]   MRN: [id number] DOB: 1980-09-17  Subjective:   Marissa Meyer is a 39 y.o. female presenting for 4 day hx of acute onset cough, shob with her cough or in the heat, slight runny nose. Smokes Black and Mild ~2/day. Had COVID 19 in March 2021. Had similar sx back then. She did get her 1st COVID vaccine in June 2021. She did not get the second.   No current facility-administered medications for this encounter.  Current Outpatient Medications:  .  albuterol (VENTOLIN HFA) 108 (90 Base) MCG/ACT inhaler, Inhale 2 puffs into the lungs every 6 (six) hours as needed for wheezing or shortness of breath., Disp: 18 g, Rfl: 0 .  benzonatate (TESSALON) 200 MG capsule, Take 1 capsule (200 mg total) by mouth 3 (three) times daily as needed for cough., Disp: 30 capsule, Rfl: 0 .  fluticasone (FLONASE) 50 MCG/ACT nasal spray, Place 2 sprays into both nostrils daily., Disp: 16 g, Rfl: 0 .  ibuprofen (ADVIL) 800 MG tablet, Take 1 tablet (800 mg total) by mouth 3 (three) times daily., Disp: 21 tablet, Rfl: 0 .  Spacer/Aero-Holding Chambers (AEROCHAMBER PLUS) inhaler, Use as instructed, Disp: 1 each, Rfl: 2 .  traMADol (ULTRAM) 50 MG tablet, Take 1 tablet (50 mg total) by mouth every 6 (six) hours as needed for severe pain., Disp: 12 tablet, Rfl: 0   Allergies  Allergen Reactions  . Amoxicillin Hives    Has patient had a PCN reaction causing immediate rash, facial/tongue/throat swelling, SOB or lightheadedness with hypotension: Yes Has patient had a PCN reaction causing severe rash involving mucus membranes or skin necrosis: No Has patient had a PCN reaction that required hospitalization No Has patient had a PCN reaction occurring within the last 10 years: Yes If all of the above answers are "NO", then may proceed with Cephalosporin use.    Past Medical History:  Diagnosis Date  . Anemia   . Chlamydia   . COVID-19   . Ectopic pregnancy   . Fibroid   . Gonorrhea   . Hypertension    . Mental disorder   . Migraines    but not diagnosed  . Pregnancy induced hypertension   . Preterm delivery   . Trichomonas      Past Surgical History:  Procedure Laterality Date  . CESAREAN SECTION N/A 03/21/2014   Procedure: CESAREAN SECTION;  Surgeon: Osborne Oman, MD;  Location: [facility];  Service: Obstetrics;  Laterality: N/A;  . DILATION AND CURETTAGE OF UTERUS    . ECTOPIC PREGNANCY SURGERY      Family History  Problem Relation Age of Onset  . Hypertension Mother   . Hypertension Maternal Grandmother   . Cancer Maternal Grandfather   . Hypertension Father     Social History   Tobacco Use  . Smoking status: Current Every Day Smoker    Packs/day: 0.25    Types: Cigars    Last attempt to quit: 08/13/2013    Years since quitting: 6.7  . Smokeless tobacco: Never Used  . Tobacco comment: black and milds; 2-3 per day  Vaping Use  . Vaping Use: Never used  Substance Use Topics  . Alcohol use: No  . Drug use: Yes    Frequency: 1.0 times per week    Types: Marijuana    ROS Denies chest pain, fever, loss of sense of taste and smell, body aches, throat pain, ear pain.   Objective:   Vitals: BP (!) 182/101  Pulse 87   Temp 98.3 F (36.8 C) (Oral)   Resp 16   Ht 4\' 9"  (1.448 m)   Wt 110 lb (49.9 kg)   SpO2 99%   BMI 23.80 kg/m   Wt Readings from Last 3 Encounters:  04/28/20 110 lb (49.9 kg)  08/17/19 124 lb (56.2 kg)  08/10/19 124 lb (56.2 kg)   Temp Readings from Last 3 Encounters:  04/28/20 98.3 F (36.8 C) (Oral)  03/02/20 98.7 F (37.1 C) (Oral)  12/03/19 99.6 F (37.6 C) (Oral)   BP Readings from Last 3 Encounters:  04/28/20 (!) 182/101  03/02/20 (!) 160/92  12/03/19 (!) 126/96   Pulse Readings from Last 3 Encounters:  04/28/20 87  03/02/20 89  12/03/19 (!) 102   Physical Exam Constitutional:      General: She is not in acute distress.    Appearance: Normal appearance. She is well-developed. She is not ill-appearing,  toxic-appearing or diaphoretic.  HENT:     Head: Normocephalic and atraumatic.     Right Ear: External ear normal.     Left Ear: External ear normal.     Nose: Nose normal.     Mouth/Throat:     Mouth: Mucous membranes are moist.  Eyes:     General: No scleral icterus.       Right eye: No discharge.        Left eye: No discharge.     Extraocular Movements: Extraocular movements intact.     Conjunctiva/sclera: Conjunctivae normal.     Pupils: Pupils are equal, round, and reactive to light.  Cardiovascular:     Rate and Rhythm: Normal rate and regular rhythm.     Pulses: Normal pulses.     Heart sounds: Normal heart sounds. No murmur heard.  No friction rub. No gallop.   Pulmonary:     Effort: Pulmonary effort is normal. No respiratory distress.     Breath sounds: Normal breath sounds. No stridor. No wheezing, rhonchi or rales.  Skin:    General: Skin is warm and dry.     Findings: No rash.  Neurological:     Mental Status: She is alert and oriented to person, place, and time.  Psychiatric:        Mood and Affect: Mood normal.        Behavior: Behavior normal.        Thought Content: Thought content normal.        Judgment: Judgment normal.     Assessment and Plan :   PDMP not reviewed this encounter.  1. Viral URI with cough   2. Shortness of breath   3. Smoker   4. Essential hypertension   5. Elevated blood pressure reading     Will manage for viral illness such as viral URI, viral syndrome, viral rhinitis, COVID-19. Counseled patient on nature of COVID-19 including modes of transmission, diagnostic testing, management and supportive care.  Offered scripts for symptomatic relief. COVID 19 testing is pending.  Start amlodipine for essential hypertension.  [facility]  Would like her prescription sent there.  Counseled patient on potential for adverse effects with medications prescribed/recommended  today, ER and return-to-clinic precautions discussed, patient verbalized understanding.     Jaynee Eagles, PA-C 04/28/20 1121

## 2020-04-28 NOTE — ED Triage Notes (Signed)
Pt c/o productive cough w/green mucousx4 days. Pt exposed to COVID + person.

## 2020-05-06 ENCOUNTER — Ambulatory Visit: Payer: Self-pay | Admitting: Family Medicine

## 2020-06-04 ENCOUNTER — Other Ambulatory Visit: Payer: Self-pay

## 2020-06-04 ENCOUNTER — Ambulatory Visit
Admission: EM | Admit: 2020-06-04 | Discharge: 2020-06-04 | Disposition: A | Discharge status: Home / Self Care | Payer: Self-pay | Attending: Emergency Medicine | Admitting: Emergency Medicine

## 2020-06-04 DIAGNOSIS — J069 Acute upper respiratory infection, unspecified: Secondary | ICD-10-CM

## 2020-06-04 DIAGNOSIS — Z1152 Encounter for screening for COVID-19: Secondary | ICD-10-CM

## 2020-06-04 MED ORDER — BENZONATATE 200 MG PO CAPS: 200.0000 mg | ORAL_CAPSULE | Freq: Three times a day (TID) | ORAL | 0 refills | Status: AC | PRN

## 2020-06-04 MED ORDER — ONDANSETRON 4 MG PO TBDP: 4.0000 mg | ORAL_TABLET | Freq: Three times a day (TID) | ORAL | 0 refills | Status: DC | PRN

## 2020-06-04 MED ORDER — IBUPROFEN 800 MG PO TABS: 800.0000 mg | ORAL_TABLET | Freq: Three times a day (TID) | ORAL | 0 refills | Status: DC

## 2020-06-04 MED ORDER — FLUTICASONE PROPIONATE 50 MCG/ACT NA SUSP: 1.0000 | Freq: Every day | NASAL | 0 refills | Status: DC

## 2020-06-04 NOTE — Discharge Instructions (Signed)
Covid PCR pending, monitor my chart Tessalon for cough Flonase for congestion ibuprofen and Tylenol as needed Zofran for nausea Follow-up if not improving or worsening

## 2020-06-04 NOTE — ED Provider Notes (Signed)
[facility]    CSN: [id number] Arrival date & time: 06/04/20  1241      History   Chief Complaint Chief Complaint  Patient presents with  . Cough    x 3 days  . Nasal Congestion    x 3 days  . Chills    x 3 days    HPI Marissa Meyer is a 39 y.o. female presenting today for evaluation of URI symptoms/Covid exposure.  Patient reports that 2 days ago she began to develop cough, nausea.  She was recently around her nephew who recently tested positive for Covid.  She has also had congestion and fatigue.  Reports associated nausea.  Denies vomiting.  Family here with similar symptoms.    HPI  Past Medical History:  Diagnosis Date  . Anemia   . Chlamydia   . COVID-19   . Ectopic pregnancy   . Fibroid   . Gonorrhea   . Hypertension   . Mental disorder   . Migraines    but not diagnosed  . Pregnancy induced hypertension   . Preterm delivery   . Trichomonas     Patient Active Problem List   Diagnosis Date Noted  . Gestational hypertension 09/19/2015  . Chronic hypertension with exacerbation during pregnancy 09/19/2015  . No prenatal care in current pregnancy in third trimester 09/19/2015  . Pregnancy with adoption planned, currently in third trimester 09/19/2015  . Cocaine abuse affecting pregnancy in third trimester ([place]) 03/14/2014  . Tobacco use in pregnancy, antepartum 02/20/2014    Past Surgical History:  Procedure Laterality Date  . CESAREAN SECTION N/A 03/21/2014   Procedure: CESAREAN SECTION;  Surgeon: Osborne Oman, MD;  Location: [facility];  Service: Obstetrics;  Laterality: N/A;  . DILATION AND CURETTAGE OF UTERUS    . ECTOPIC PREGNANCY SURGERY      OB History    Gravida  5   Para  4   Term  0   Preterm  4   AB  1   Living  3     SAB      TAB      Ectopic  1   Multiple  0   Live Births  3            Home Medications    Prior to Admission medications   Medication Sig Start Date End Date Taking? Authorizing  Provider  albuterol (VENTOLIN HFA) 108 (90 Base) MCG/ACT inhaler Inhale 2 puffs into the lungs every 6 (six) hours as needed for wheezing or shortness of breath. 04/28/20   Jaynee Eagles, PA-C  amLODipine (NORVASC) 5 MG tablet Take 1 tablet (5 mg total) by mouth daily. 04/28/20   Jaynee Eagles, PA-C  benzonatate (TESSALON) 200 MG capsule Take 1 capsule (200 mg total) by mouth 3 (three) times daily as needed for up to 7 days for cough. 06/04/20 06/11/20  Mrk Buzby C, PA-C  fluticasone (FLONASE) 50 MCG/ACT nasal spray Place 1-2 sprays into both nostrils daily. 06/04/20   Domini Vandehei C, PA-C  ibuprofen (ADVIL) 800 MG tablet Take 1 tablet (800 mg total) by mouth 3 (three) times daily. 06/04/20   Abaigeal Moomaw C, PA-C  ondansetron (ZOFRAN ODT) 4 MG disintegrating tablet Take 1 tablet (4 mg total) by mouth every 8 (eight) hours as needed for nausea or vomiting. 06/04/20   Nirvan Laban C, PA-C  promethazine-dextromethorphan (PROMETHAZINE-DM) 6.25-15 MG/5ML syrup Take 5 mLs by mouth at bedtime as needed for cough. 04/28/20  Jaynee Eagles, PA-C  Spacer/Aero-Holding Chambers (AEROCHAMBER PLUS) inhaler Use as instructed 12/03/19   Melynda Ripple, MD  atorvastatin (LIPITOR) 20 MG tablet Take 1 tablet (20 mg total) by mouth daily. Patient not taking: Reported on 08/17/2019 08/22/18 08/17/19  Scot Jun, FNP  hydrochlorothiazide (HYDRODIURIL) 25 MG tablet Take 1 tablet (25 mg total) by mouth daily. Patient not taking: Reported on 08/17/2019 04/11/19 08/17/19  Mesner, Corene Cornea, MD    Family History Family History  Problem Relation Age of Onset  . Hypertension Mother   . Hypertension Maternal Grandmother   . Cancer Maternal Grandfather   . Hypertension Father     Social History Social History   Tobacco Use  . Smoking status: Current Every Day Smoker    Packs/day: 0.25    Types: Cigars    Last attempt to quit: 08/13/2013    Years since quitting: 6.8  . Smokeless tobacco: Never Used  . Tobacco  comment: black and milds; 2-3 per day  Vaping Use  . Vaping Use: Never used  Substance Use Topics  . Alcohol use: No  . Drug use: Yes    Frequency: 1.0 times per week    Types: Marijuana     Allergies   Amoxicillin   Review of Systems Review of Systems  Constitutional: Negative for activity change, appetite change, chills, fatigue and fever.  HENT: Positive for congestion, rhinorrhea, sinus pressure and sore throat. Negative for ear pain and trouble swallowing.   Eyes: Negative for discharge and redness.  Respiratory: Positive for cough. Negative for chest tightness and shortness of breath.   Cardiovascular: Negative for chest pain.  Gastrointestinal: Positive for nausea. Negative for abdominal pain, diarrhea and vomiting.  Musculoskeletal: Negative for myalgias.  Skin: Negative for rash.  Neurological: Positive for headaches. Negative for dizziness and light-headedness.     Physical Exam Triage Vital Signs ED Triage Vitals  Enc Vitals Group     BP 06/04/20 1423 (!) 156/96     Pulse Rate 06/04/20 1423 93     Resp 06/04/20 1423 18     Temp 06/04/20 1423 99.2 F (37.3 C)     Temp Source 06/04/20 1423 Oral     SpO2 06/04/20 1423 98 %     Weight --      Height --      Head Circumference --      Peak Flow --      Pain Score 06/04/20 1430 0     Pain Loc --      Pain Edu? --      Excl. in [place]? --    No data found.  Updated Vital Signs BP (!) 156/96 (BP Location: Right Arm)   Pulse 93   Temp 99.2 F (37.3 C) (Oral)   Resp 18   LMP  (LMP Unknown)   SpO2 98%   Visual Acuity Right Eye Distance:   Left Eye Distance:   Bilateral Distance:    Right Eye Near:   Left Eye Near:    Bilateral Near:     Physical Exam Vitals and nursing note reviewed.  Constitutional:      Appearance: She is well-developed.     Comments: No acute distress  HENT:     Head: Normocephalic and atraumatic.     Ears:     Comments: Bilateral ears without tenderness to palpation of  external auricle, tragus and mastoid, EAC's without erythema or swelling, TM's with good bony landmarks and cone of light. Non erythematous.  Nose: Nose normal.     Mouth/Throat:     Comments: Oral mucosa pink and moist, no tonsillar enlargement or exudate. Posterior pharynx patent and nonerythematous, no uvula deviation or swelling. Normal phonation. Eyes:     Conjunctiva/sclera: Conjunctivae normal.  Cardiovascular:     Rate and Rhythm: Normal rate.  Pulmonary:     Effort: Pulmonary effort is normal. No respiratory distress.     Comments: Breathing comfortably at rest, CTABL, no wheezing, rales or other adventitious sounds auscultated Abdominal:     General: There is no distension.  Musculoskeletal:        General: Normal range of motion.     Cervical back: Neck supple.  Skin:    General: Skin is warm and dry.  Neurological:     Mental Status: She is alert and oriented to person, place, and time.      UC Treatments / Results  Labs (all labs ordered are listed, but only abnormal results are displayed) Labs Reviewed  NOVEL CORONAVIRUS, NAA    EKG   Radiology No results found.  Procedures Procedures (including critical care time)  Medications Ordered in UC Medications - No data to display  Initial Impression / Assessment and Plan / UC Course  I have reviewed the triage vital signs and the nursing notes.  Pertinent labs & imaging results that were available during my care of the patient were reviewed by me and considered in my medical decision making (see chart for details).     Covid PCR pending, suspect likely viral etiology and recommending symptomatic and supportive care, exam reassuring.  Continue to monitor,Discussed strict return precautions. Patient verbalized understanding and is agreeable with plan.  Final Clinical Impressions(s) / UC Diagnoses   Final diagnoses:  Encounter for screening for COVID-19  Viral URI with cough     Discharge  Instructions     Covid PCR pending, monitor my chart Tessalon for cough Flonase for congestion ibuprofen and Tylenol as needed Zofran for nausea Follow-up if not improving or worsening    ED Prescriptions    Medication Sig Dispense Auth. Provider   benzonatate (TESSALON) 200 MG capsule Take 1 capsule (200 mg total) by mouth 3 (three) times daily as needed for up to 7 days for cough. 28 capsule Valecia Beske C, PA-C   fluticasone (FLONASE) 50 MCG/ACT nasal spray Place 1-2 sprays into both nostrils daily. 16 g Armoni Depass C, PA-C   ibuprofen (ADVIL) 800 MG tablet Take 1 tablet (800 mg total) by mouth 3 (three) times daily. 21 tablet Kyzen Horn C, PA-C   ondansetron (ZOFRAN ODT) 4 MG disintegrating tablet Take 1 tablet (4 mg total) by mouth every 8 (eight) hours as needed for nausea or vomiting. 20 tablet Lorie Cleckley, [place] C, PA-C     PDMP not reviewed this encounter.   Janith Lima, [place] 06/04/20 [id number]

## 2020-06-04 NOTE — ED Triage Notes (Signed)
Pt states cough, congestion x 3 days and runny nose. Nauseated yesterday. Pt is aox4 and ambulatory.

## 2020-06-06 LAB — SARS-COV-2, NAA 2 DAY TAT

## 2020-06-06 LAB — NOVEL CORONAVIRUS, NAA: SARS-CoV-2, NAA: NOT DETECTED

## 2020-06-24 ENCOUNTER — Encounter (HOSPITAL_COMMUNITY): Payer: Self-pay

## 2020-06-24 ENCOUNTER — Other Ambulatory Visit: Payer: Self-pay

## 2020-06-24 ENCOUNTER — Ambulatory Visit (HOSPITAL_COMMUNITY)
Admission: EM | Admit: 2020-06-24 | Discharge: 2020-06-24 | Disposition: A | Discharge status: Home / Self Care | Payer: Self-pay | Attending: Family Medicine | Admitting: Family Medicine

## 2020-06-24 DIAGNOSIS — K047 Periapical abscess without sinus: Secondary | ICD-10-CM

## 2020-06-24 MED ORDER — LIDOCAINE VISCOUS HCL 2 % MT SOLN: 10.0000 mL | OROMUCOSAL | 0 refills | Status: DC | PRN

## 2020-06-24 MED ORDER — CLINDAMYCIN HCL 150 MG PO CAPS: 150.0000 mg | ORAL_CAPSULE | Freq: Two times a day (BID) | ORAL | 0 refills | Status: DC

## 2020-06-24 NOTE — ED Provider Notes (Signed)
[place]    CSN: [id number] Arrival date & time: 06/24/20  1422      History   Chief Complaint Chief Complaint  Patient presents with  . Dental Pain    HPI Marissa Meyer is a 39 y.o. female.   Here today with 2 weeks of worsening left lower jaw pain, swelling from a known dental abscess. States the pain got much worse overnight and she thinks the abscess drained. Denies fever, chills, sweats, body aches but is starting to have some aching down into the neck on that left side. Not taking anything OTC for sxs. Does not have a dentist.      Past Medical History:  Diagnosis Date  . Anemia   . Chlamydia   . COVID-19   . Ectopic pregnancy   . Fibroid   . Gonorrhea   . Hypertension   . Mental disorder   . Migraines    but not diagnosed  . Pregnancy induced hypertension   . Preterm delivery   . Trichomonas     Patient Active Problem List   Diagnosis Date Noted  . Gestational hypertension 09/19/2015  . Chronic hypertension with exacerbation during pregnancy 09/19/2015  . No prenatal care in current pregnancy in third trimester 09/19/2015  . Pregnancy with adoption planned, currently in third trimester 09/19/2015  . Cocaine abuse affecting pregnancy in third trimester ([place]) 03/14/2014  . Tobacco use in pregnancy, antepartum 02/20/2014    Past Surgical History:  Procedure Laterality Date  . CESAREAN SECTION N/A 03/21/2014   Procedure: CESAREAN SECTION;  Surgeon: Osborne Oman, MD;  Location: [facility];  Service: Obstetrics;  Laterality: N/A;  . DILATION AND CURETTAGE OF UTERUS    . ECTOPIC PREGNANCY SURGERY      OB History    Gravida  5   Para  4   Term  0   Preterm  4   AB  1   Living  3     SAB      TAB      Ectopic  1   Multiple  0   Live Births  3            Home Medications    Prior to Admission medications   Medication Sig Start Date End Date Taking? Authorizing Provider  amLODipine (NORVASC) 5 MG tablet Take 1  tablet (5 mg total) by mouth daily. 04/28/20  Yes Jaynee Eagles, PA-C  albuterol (VENTOLIN HFA) 108 (90 Base) MCG/ACT inhaler Inhale 2 puffs into the lungs every 6 (six) hours as needed for wheezing or shortness of breath. 04/28/20   Jaynee Eagles, PA-C  clindamycin (CLEOCIN) 150 MG capsule Take 1 capsule (150 mg total) by mouth 2 (two) times daily. 06/24/20   Volney American, PA-C  fluticasone [facility]) 50 MCG/ACT nasal spray Place 1-2 sprays into both nostrils daily. 06/04/20   Wieters, Hallie C, PA-C  ibuprofen (ADVIL) 800 MG tablet Take 1 tablet (800 mg total) by mouth 3 (three) times daily. 06/04/20   Wieters, Hallie C, PA-C  lidocaine (XYLOCAINE) 2 % solution Use as directed 10 mLs in the mouth or throat as needed for mouth pain. 06/24/20   Volney American, PA-C  ondansetron (ZOFRAN ODT) 4 MG disintegrating tablet Take 1 tablet (4 mg total) by mouth every 8 (eight) hours as needed for nausea or vomiting. 06/04/20   Wieters, Hallie C, PA-C  promethazine-dextromethorphan (PROMETHAZINE-DM) 6.25-15 MG/5ML syrup Take 5 mLs by mouth at bedtime as needed  for cough. 04/28/20   Jaynee Eagles, PA-C  Spacer/Aero-Holding Chambers (AEROCHAMBER PLUS) inhaler Use as instructed 12/03/19   Melynda Ripple, MD  atorvastatin (LIPITOR) 20 MG tablet Take 1 tablet (20 mg total) by mouth daily. Patient not taking: Reported on 08/17/2019 08/22/18 08/17/19  Scot Jun, FNP  hydrochlorothiazide (HYDRODIURIL) 25 MG tablet Take 1 tablet (25 mg total) by mouth daily. Patient not taking: Reported on 08/17/2019 04/11/19 08/17/19  Mesner, Corene Cornea, MD    Family History Family History  Problem Relation Age of Onset  . Hypertension Mother   . Hypertension Maternal Grandmother   . Cancer Maternal Grandfather   . Hypertension Father     Social History Social History   Tobacco Use  . Smoking status: Current Every Day Smoker    Packs/day: 0.25    Types: Cigars    Last attempt to quit: 08/13/2013    Years since  quitting: 6.8  . Smokeless tobacco: Never Used  . Tobacco comment: black and milds; 2-3 per day  Vaping Use  . Vaping Use: Never used  Substance Use Topics  . Alcohol use: No  . Drug use: Yes    Frequency: 1.0 times per week    Types: Marijuana     Allergies   Amoxicillin   Review of Systems Review of Systems PER HPI   Physical Exam Triage Vital Signs ED Triage Vitals  Enc Vitals Group     BP 06/24/20 1517 (!) 177/116     Pulse Rate 06/24/20 1517 89     Resp 06/24/20 1517 17     Temp 06/24/20 1517 98.9 F (37.2 C)     Temp Source 06/24/20 1517 Oral     SpO2 06/24/20 1517 99 %     Weight --      Height --      Head Circumference --      Peak Flow --      Pain Score 06/24/20 1518 6     Pain Loc --      Pain Edu? --      Excl. in [place]? --    No data found.  Updated Vital Signs BP (!) 177/116 (BP Location: Right Arm)   Pulse 89   Temp 98.9 F (37.2 C) (Oral)   Resp 17   LMP 06/15/2020   SpO2 99%   Visual Acuity Right Eye Distance:   Left Eye Distance:   Bilateral Distance:    Right Eye Near:   Left Eye Near:    Bilateral Near:     Physical Exam Vitals and nursing note reviewed.  Constitutional:      Appearance: Normal appearance. She is not ill-appearing.  HENT:     Head: Atraumatic.     Right Ear: Tympanic membrane normal.     Left Ear: Tympanic membrane normal.     Nose: Nose normal.     Mouth/Throat:     Mouth: Mucous membranes are moist.     Pharynx: Oropharynx is clear. Posterior oropharyngeal erythema (and edema around broken molar left lower jaw) present.     Comments: Poor dentition diffusely, multiple areas of decay present Eyes:     Extraocular Movements: Extraocular movements intact.     Conjunctiva/sclera: Conjunctivae normal.  Cardiovascular:     Rate and Rhythm: Normal rate and regular rhythm.     Heart sounds: Normal heart sounds.  Pulmonary:     Effort: Pulmonary effort is normal.     Breath sounds: Normal breath sounds.  Abdominal:     General: Bowel sounds are normal.     Palpations: Abdomen is soft.  Musculoskeletal:        General: Normal range of motion.     Cervical back: Normal range of motion and neck supple.  Skin:    General: Skin is warm and dry.  Neurological:     Mental Status: She is alert and oriented to person, place, and time.  Psychiatric:        Mood and Affect: Mood normal.        Thought Content: Thought content normal.        Judgment: Judgment normal.      UC Treatments / Results  Labs (all labs ordered are listed, but only abnormal results are displayed) Labs Reviewed - No data to display  EKG   Radiology No results found.  Procedures Procedures (including critical care time)  Medications Ordered in UC Medications - No data to display  Initial Impression / Assessment and Plan / UC Course  I have reviewed the triage vital signs and the nursing notes.  Pertinent labs & imaging results that were available during my care of the patient were reviewed by me and considered in my medical decision making (see chart for details).     Dental abscess  Tx with clindamycin, viscous lidocaine prn, OTC pain relievers, salt water gargles. Est with a dentist as soon as possible. F/u if worsening in meantime.   Final Clinical Impressions(s) / UC Diagnoses   Final diagnoses:  Dental abscess   Discharge Instructions   None    ED Prescriptions    Medication Sig Dispense Auth. Provider   clindamycin (CLEOCIN) 150 MG capsule Take 1 capsule (150 mg total) by mouth 2 (two) times daily. 14 capsule Volney American, PA-C   lidocaine (XYLOCAINE) 2 % solution Use as directed 10 mLs in the mouth or throat as needed for mouth pain. 100 mL Volney American, [place]     PDMP not reviewed this encounter.   Volney American, [place] 06/24/20 1537

## 2020-06-24 NOTE — ED Triage Notes (Signed)
Pt c/o dental pain to lower left molar tooth area for approx 2 weeks, increased last night, and c/o swelling/tenderness under left mandible. Reports subjective fever this morning.  Denies n/v, foul tast/drainage in mouth.  No OTC meds for pain. Has not taken BP meds today.

## 2020-08-03 ENCOUNTER — Ambulatory Visit: Payer: Self-pay | Admitting: Family Medicine

## 2020-08-26 ENCOUNTER — Ambulatory Visit
Admission: EM | Admit: 2020-08-26 | Discharge: 2020-08-26 | Disposition: A | Discharge status: Home / Self Care | Payer: Self-pay | Attending: Emergency Medicine | Admitting: Emergency Medicine

## 2020-08-26 DIAGNOSIS — J209 Acute bronchitis, unspecified: Secondary | ICD-10-CM

## 2020-08-26 DIAGNOSIS — Z20822 Contact with and (suspected) exposure to covid-19: Secondary | ICD-10-CM

## 2020-08-26 MED ORDER — PREDNISONE 20 MG PO TABS: 40.0000 mg | ORAL_TABLET | Freq: Every day | ORAL | 0 refills | Status: AC

## 2020-08-26 MED ORDER — AZITHROMYCIN 250 MG PO TABS: ORAL_TABLET | ORAL | 0 refills | Status: DC

## 2020-08-26 MED ORDER — BENZONATATE 200 MG PO CAPS: 200.0000 mg | ORAL_CAPSULE | Freq: Three times a day (TID) | ORAL | 0 refills | Status: AC | PRN

## 2020-08-26 NOTE — Discharge Instructions (Signed)
Covid test pending, monitor my chart for results Begin azithromycin-2 tablets today, 1 tablet for the following 4 days Tessalon for cough Prednisone 40 mg daily for the next 5 days Albuterol inhaler as needed  Follow-up if not improving or worsening

## 2020-08-26 NOTE — ED Provider Notes (Signed)
[facility]    CSN: [id number] Arrival date & time: 08/26/20  1353      History   Chief Complaint Chief Complaint  Patient presents with   Cough   Abdominal Pain    HPI Marissa Meyer is a 39 y.o. female presenting today for evaluation of a cough.  Reports that she has had a cough for approximately 5 days.  Reports associated abdominal pain nausea and constipation.  Has had some chills.  Children with similar symptoms.  Reports feels similar to when she has had bronchitis in the past.  Reports some wheezing especially at nighttime.  Children have tested negative for Covid.  HPI  Past Medical History:  Diagnosis Date   Anemia    Chlamydia    COVID-19    Ectopic pregnancy    Fibroid    Gonorrhea    Hypertension    Mental disorder    Migraines    but not diagnosed   Pregnancy induced hypertension    Preterm delivery    Trichomonas     Patient Active Problem List   Diagnosis Date Noted   Gestational hypertension 09/19/2015   Chronic hypertension with exacerbation during pregnancy 09/19/2015   No prenatal care in current pregnancy in third trimester 09/19/2015   Pregnancy with adoption planned, currently in third trimester 09/19/2015   Cocaine abuse affecting pregnancy in third trimester ([place]) 03/14/2014   Tobacco use in pregnancy, antepartum 02/20/2014    Past Surgical History:  Procedure Laterality Date   CESAREAN SECTION N/A 03/21/2014   Procedure: CESAREAN SECTION;  Surgeon: Osborne Oman, MD;  Location: [facility];  Service: Obstetrics;  Laterality: N/A;   DILATION AND CURETTAGE OF UTERUS     ECTOPIC PREGNANCY SURGERY      OB History    Gravida  5   Para  4   Term  0   Preterm  4   AB  1   Living  3     SAB      IAB      Ectopic  1   Multiple  0   Live Births  3            Home Medications    Prior to Admission medications   Medication Sig Start Date End Date Taking? Authorizing Provider   albuterol (VENTOLIN HFA) 108 (90 Base) MCG/ACT inhaler Inhale 2 puffs into the lungs every 6 (six) hours as needed for wheezing or shortness of breath. 04/28/20   Jaynee Eagles, PA-C  amLODipine (NORVASC) 5 MG tablet Take 1 tablet (5 mg total) by mouth daily. 04/28/20   Jaynee Eagles, PA-C  azithromycin (ZITHROMAX) 250 MG tablet Take first 2 tablets together, then 1 every day until finished. 08/26/20   Anisa Leanos C, PA-C  benzonatate (TESSALON) 200 MG capsule Take 1 capsule (200 mg total) by mouth 3 (three) times daily as needed for up to 7 days for cough. 08/26/20 09/02/20  Layana Konkel C, PA-C  clindamycin (CLEOCIN) 150 MG capsule Take 1 capsule (150 mg total) by mouth 2 (two) times daily. 06/24/20   Volney American, PA-C  fluticasone [facility]) 50 MCG/ACT nasal spray Place 1-2 sprays into both nostrils daily. 06/04/20   Dannie Woolen C, PA-C  ibuprofen (ADVIL) 800 MG tablet Take 1 tablet (800 mg total) by mouth 3 (three) times daily. 06/04/20   Autumm Hattery C, PA-C  lidocaine (XYLOCAINE) 2 % solution Use as directed 10 mLs in the mouth or throat as  needed for mouth pain. 06/24/20   Volney American, PA-C  ondansetron (ZOFRAN ODT) 4 MG disintegrating tablet Take 1 tablet (4 mg total) by mouth every 8 (eight) hours as needed for nausea or vomiting. 06/04/20   Sharia Averitt C, PA-C  predniSONE (DELTASONE) 20 MG tablet Take 2 tablets (40 mg total) by mouth daily with breakfast for 5 days. 08/26/20 08/31/20  Enas Winchel C, PA-C  promethazine-dextromethorphan (PROMETHAZINE-DM) 6.25-15 MG/5ML syrup Take 5 mLs by mouth at bedtime as needed for cough. 04/28/20   Jaynee Eagles, PA-C  Spacer/Aero-Holding Chambers (AEROCHAMBER PLUS) inhaler Use as instructed 12/03/19   Melynda Ripple, MD  atorvastatin (LIPITOR) 20 MG tablet Take 1 tablet (20 mg total) by mouth daily. Patient not taking: Reported on 08/17/2019 08/22/18 08/17/19  Scot Jun, FNP  hydrochlorothiazide (HYDRODIURIL) 25  MG tablet Take 1 tablet (25 mg total) by mouth daily. Patient not taking: Reported on 08/17/2019 04/11/19 08/17/19  Mesner, Corene Cornea, MD    Family History Family History  Problem Relation Age of Onset   Hypertension Mother    Hypertension Maternal Grandmother    Cancer Maternal Grandfather    Hypertension Father     Social History Social History   Tobacco Use   Smoking status: Current Every Day Smoker    Packs/day: 0.25    Types: Cigars    Last attempt to quit: 08/13/2013    Years since quitting: 7.0   Smokeless tobacco: Never Used   Tobacco comment: black and milds; 2-3 per day  Vaping Use   Vaping Use: Never used  Substance Use Topics   Alcohol use: No   Drug use: Yes    Frequency: 1.0 times per week    Types: Marijuana     Allergies   Amoxicillin   Review of Systems Review of Systems  Constitutional: Negative for activity change, appetite change, chills, fatigue and fever.  HENT: Positive for congestion, rhinorrhea, sinus pressure and sore throat. Negative for ear pain and trouble swallowing.   Eyes: Negative for discharge and redness.  Respiratory: Positive for cough. Negative for chest tightness and shortness of breath.   Cardiovascular: Negative for chest pain.  Gastrointestinal: Positive for abdominal pain and nausea. Negative for diarrhea and vomiting.  Musculoskeletal: Negative for myalgias.  Skin: Negative for rash.  Neurological: Negative for dizziness, light-headedness and headaches.     Physical Exam Triage Vital Signs ED Triage Vitals  Enc Vitals Group     BP 08/26/20 1423 (!) 131/91     Pulse Rate 08/26/20 1423 94     Resp 08/26/20 1423 18     Temp 08/26/20 1423 99 F (37.2 C)     Temp Source 08/26/20 1423 Oral     SpO2 08/26/20 1423 98 %     Weight --      Height --      Head Circumference --      Peak Flow --      Pain Score 08/26/20 1424 2     Pain Loc --      Pain Edu? --      Excl. in [place]? --    No data found.  Updated  Vital Signs BP (!) 131/91 (BP Location: Left Arm)    Pulse 94    Temp 99 F (37.2 C) (Oral)    Resp 18    SpO2 98%   Visual Acuity Right Eye Distance:   Left Eye Distance:   Bilateral Distance:    Right Eye Near:  Left Eye Near:    Bilateral Near:     Physical Exam Vitals and nursing note reviewed.  Constitutional:      Appearance: She is well-developed and well-nourished.     Comments: No acute distress  HENT:     Head: Normocephalic and atraumatic.     Ears:     Comments: Bilateral ears without tenderness to palpation of external auricle, tragus and mastoid, EAC's without erythema or swelling, TM's with good bony landmarks and cone of light. Non erythematous.     Nose: Nose normal.     Mouth/Throat:     Comments: Oral mucosa pink and moist, no tonsillar enlargement or exudate. Posterior pharynx patent and nonerythematous, no uvula deviation or swelling. Normal phonation. Eyes:     Conjunctiva/sclera: Conjunctivae normal.  Cardiovascular:     Rate and Rhythm: Normal rate and regular rhythm.  Pulmonary:     Effort: Pulmonary effort is normal. No respiratory distress.     Comments: Breathing comfortably at rest, CTABL, no wheezing, rales or other adventitious sounds auscultated Abdominal:     General: There is no distension.  Musculoskeletal:        General: Normal range of motion.     Cervical back: Neck supple.  Skin:    General: Skin is warm and dry.  Neurological:     Mental Status: She is alert and oriented to person, place, and time.  Psychiatric:        Mood and Affect: Mood and affect normal.      UC Treatments / Results  Labs (all labs ordered are listed, but only abnormal results are displayed) Labs Reviewed  NOVEL CORONAVIRUS, NAA    EKG   Radiology No results found.  Procedures Procedures (including critical care time)  Medications Ordered in UC Medications - No data to display  Initial Impression / Assessment and Plan / UC Course  I  have reviewed the triage vital signs and the nursing notes.  Pertinent labs & imaging results that were available during my care of the patient were reviewed by me and considered in my medical decision making (see chart for details).     Treating for bronchitis given reported wheezing at home, although lungs clear today..  Treating with prednisone, albuterol as well as course of azithromycin.  Tessalon for cough.  Covid test pending.  Discussed strict return precautions. Patient verbalized understanding and is agreeable with plan.  Final Clinical Impressions(s) / UC Diagnoses   Final diagnoses:  Encounter for screening laboratory testing for COVID-19 virus  Acute bronchitis, unspecified organism     Discharge Instructions     Covid test pending, monitor my chart for results Begin azithromycin-2 tablets today, 1 tablet for the following 4 days Tessalon for cough Prednisone 40 mg daily for the next 5 days Albuterol inhaler as needed  Follow-up if not improving or worsening    ED Prescriptions    Medication Sig Dispense Auth. Provider   predniSONE (DELTASONE) 20 MG tablet Take 2 tablets (40 mg total) by mouth daily with breakfast for 5 days. 10 tablet Marquelle Musgrave C, PA-C   azithromycin (ZITHROMAX) 250 MG tablet Take first 2 tablets together, then 1 every day until finished. 6 tablet Fancy Dunkley C, PA-C   benzonatate (TESSALON) 200 MG capsule Take 1 capsule (200 mg total) by mouth 3 (three) times daily as needed for up to 7 days for cough. 28 capsule Raysean Graumann, [place] C, PA-C     PDMP not reviewed this encounter.  Janith Lima, [place] 08/26/20 1523

## 2020-08-26 NOTE — ED Triage Notes (Signed)
Pt c/o cough x 5 days. C/o abdominal pain, chills, nausea and constipation x2 days ago.

## 2020-08-28 LAB — SARS-COV-2, NAA 2 DAY TAT

## 2020-08-28 LAB — NOVEL CORONAVIRUS, NAA: SARS-CoV-2, NAA: NOT DETECTED

## 2020-09-14 ENCOUNTER — Encounter: Payer: Self-pay | Admitting: Family Medicine

## 2020-09-14 ENCOUNTER — Other Ambulatory Visit: Payer: Self-pay

## 2020-09-14 ENCOUNTER — Ambulatory Visit (INDEPENDENT_AMBULATORY_CARE_PROVIDER_SITE_OTHER): Payer: Self-pay | Admitting: *Deleted

## 2020-09-14 VITALS — BP 170/82 | HR 88 | Ht <= 58 in | Wt 112.2 lb

## 2020-09-14 DIAGNOSIS — O10019 Pre-existing essential hypertension complicating pregnancy, unspecified trimester: Secondary | ICD-10-CM

## 2020-09-14 DIAGNOSIS — Z3200 Encounter for pregnancy test, result unknown: Secondary | ICD-10-CM

## 2020-09-14 LAB — POCT PREGNANCY, URINE: Preg Test, Ur: POSITIVE — AB

## 2020-09-14 MED ORDER — LABETALOL HCL 200 MG PO TABS: 200.0000 mg | ORAL_TABLET | Freq: Two times a day (BID) | ORAL | 0 refills | Status: DC

## 2020-09-14 NOTE — Patient Instructions (Signed)
Prenatal Care Providers           [facility]  [address] [phone]  [facility]   [address]  [phone]  [facility] [place]       [address] [phone]            [facility] @      [address] [phone]          [facility] @ [place]   [address] [phone]  [facility]  [address] [phone]     [facility])  [address]   [phone]     [facility]  Phone: [phone]  [facility]  Phone: [phone]  [facility] Phone: [phone]  [facility] Phone: [phone]  [facility] Phone: [phone]  [facility] Phone: [phone]  [facility]  Phone: [phone]  

## 2020-09-14 NOTE — Progress Notes (Addendum)
11:10: Here for pregnancy test. Unable to void. Given water to drink. Marissa Diers,RN  11:20 able to urinate , urine pregnancy test positive. Reports LMP 08/02/20- states sure is within a few days of that. This gives her a EDD of 08/28/22and is [redacted]w[redacted]d. . BP elevated today at 170/88.  States hasn't taken norvasc in about a week.BP repeated at 170/82.  Does report headaches and feeling like will pass out sometimes. BP's and hx HTN ,not takng bp meds about one month.  Reported to Terri Burleson,NP. Orders to start labetalol and return bp check one week.  Advised to start prenatal vitamins and to start prenatal care asap. Given list of providers ; would like to go here as she has in past and is high risk due to AMA, HTN. Advised is she has severe pain , severe headache or vaginal bleeding to go to [facility]  Advised to pick up Labetalol and start today. Will make appointment at checkout for bp check one week.  She voices understanding. Marissa Bounds, RN, MSN, NP-BC Nurse Practitioner, [facility] for [employer], [facility] 09/14/2020 12:09 PM

## 2020-09-18 ENCOUNTER — Other Ambulatory Visit: Payer: Self-pay

## 2020-09-18 ENCOUNTER — Encounter: Payer: Self-pay | Admitting: Emergency Medicine

## 2020-09-18 ENCOUNTER — Ambulatory Visit
Admission: EM | Admit: 2020-09-18 | Discharge: 2020-09-18 | Disposition: A | Discharge status: Home / Self Care | Payer: Self-pay | Attending: Emergency Medicine | Admitting: Emergency Medicine

## 2020-09-18 DIAGNOSIS — J069 Acute upper respiratory infection, unspecified: Secondary | ICD-10-CM

## 2020-09-18 DIAGNOSIS — R52 Pain, unspecified: Secondary | ICD-10-CM

## 2020-09-18 MED ORDER — FLUTICASONE PROPIONATE 50 MCG/ACT NA SUSP: 1.0000 | Freq: Every day | NASAL | 0 refills | Status: DC

## 2020-09-18 MED ORDER — DM-GUAIFENESIN ER 30-600 MG PO TB12: 1.0000 | ORAL_TABLET | Freq: Two times a day (BID) | ORAL | 0 refills | Status: DC

## 2020-09-18 MED ORDER — PREDNISONE 10 MG PO TABS: 30.0000 mg | ORAL_TABLET | Freq: Every day | ORAL | 0 refills | Status: AC

## 2020-09-18 NOTE — Discharge Instructions (Signed)
Covid test pending, monitor my chart for results Tylenol for fever, do not use ibuprofen Prednisone 30 mg daily for 3 days Mucinex DM for cough and congestion Flonase for further congestion May use was provided for other over-the-counter medicine safe to use in pregnancy Please keep close eye on breathing and symptoms, follow-up if not improving or worsening

## 2020-09-18 NOTE — ED Provider Notes (Signed)
[facility]    CSN: [id number] Arrival date & time: 09/18/20  1027      History   Chief Complaint Chief Complaint  Patient presents with  . Chills  . Cough    HPI Marissa Meyer is a 40 y.o. female approximately [redacted] weeks pregnant presenting today for evaluation of cough and congestion.  Reports over the past 2 days has had cough congestion chills and fever. Reports low grade fevers. Ribs sore. Slight wheezing yesterday. Reports boyfriend sick.   HPI  Past Medical History:  Diagnosis Date  . Anemia   . Chlamydia   . COVID-19   . Ectopic pregnancy   . Fibroid   . Gonorrhea   . Hypertension   . Mental disorder   . Migraines    but not diagnosed  . Pregnancy induced hypertension   . Preterm delivery   . Trichomonas     Patient Active Problem List   Diagnosis Date Noted  . Gestational hypertension 09/19/2015  . Chronic hypertension with exacerbation during pregnancy 09/19/2015  . No prenatal care in current pregnancy in third trimester 09/19/2015  . Pregnancy with adoption planned, currently in third trimester 09/19/2015  . Cocaine abuse affecting pregnancy in third trimester ([place]) 03/14/2014  . Tobacco use in pregnancy, antepartum 02/20/2014    Past Surgical History:  Procedure Laterality Date  . CESAREAN SECTION N/A 03/21/2014   Procedure: CESAREAN SECTION;  Surgeon: Osborne Oman, MD;  Location: [facility];  Service: Obstetrics;  Laterality: N/A;  . DILATION AND CURETTAGE OF UTERUS    . ECTOPIC PREGNANCY SURGERY      OB History    Gravida  6   Para  4   Term  0   Preterm  4   AB  1   Living  3     SAB      IAB      Ectopic  1   Multiple  0   Live Births  3            Home Medications    Prior to Admission medications   Medication Sig Start Date End Date Taking? Authorizing Provider  albuterol (VENTOLIN HFA) 108 (90 Base) MCG/ACT inhaler Inhale 2 puffs into the lungs every 6 (six) hours as needed for wheezing or  shortness of breath. 04/28/20  Yes Jaynee Eagles, PA-C  dextromethorphan-guaiFENesin [facility] DM) 30-600 MG 12hr tablet Take 1 tablet by mouth 2 (two) times daily. 09/18/20  Yes Ferrell Flam C, PA-C  fluticasone (FLONASE) 50 MCG/ACT nasal spray Place 1-2 sprays into both nostrils daily. 09/18/20  Yes Athanasius Kesling C, PA-C  labetalol (NORMODYNE) 200 MG tablet Take 1 tablet (200 mg total) by mouth 2 (two) times daily. 09/14/20  Yes Burleson, Terri L, NP  predniSONE (DELTASONE) 10 MG tablet Take 3 tablets (30 mg total) by mouth daily with breakfast for 3 days. 09/18/20 09/21/20 Yes Jeston Junkins C, PA-C  amLODipine (NORVASC) 5 MG tablet Take 1 tablet (5 mg total) by mouth daily. Patient not taking: No sig reported 04/28/20   Jaynee Eagles, PA-C  azithromycin (ZITHROMAX) 250 MG tablet Take first 2 tablets together, then 1 every day until finished. 08/26/20   Varetta Chavers C, PA-C  clindamycin (CLEOCIN) 150 MG capsule Take 1 capsule (150 mg total) by mouth 2 (two) times daily. 06/24/20   Volney American, PA-C  ibuprofen (ADVIL) 800 MG tablet Take 1 tablet (800 mg total) by mouth 3 (three) times daily. 06/04/20  Cherika Jessie C, PA-C  lidocaine (XYLOCAINE) 2 % solution Use as directed 10 mLs in the mouth or throat as needed for mouth pain. 06/24/20   Volney American, PA-C  ondansetron (ZOFRAN ODT) 4 MG disintegrating tablet Take 1 tablet (4 mg total) by mouth every 8 (eight) hours as needed for nausea or vomiting. 06/04/20   Hesham Womac C, PA-C  promethazine-dextromethorphan (PROMETHAZINE-DM) 6.25-15 MG/5ML syrup Take 5 mLs by mouth at bedtime as needed for cough. 04/28/20   Jaynee Eagles, PA-C  Spacer/Aero-Holding Chambers (AEROCHAMBER PLUS) inhaler Use as instructed 12/03/19   Melynda Ripple, MD  atorvastatin (LIPITOR) 20 MG tablet Take 1 tablet (20 mg total) by mouth daily. Patient not taking: Reported on 08/17/2019 08/22/18 08/17/19  Scot Jun, FNP  hydrochlorothiazide (HYDRODIURIL)  25 MG tablet Take 1 tablet (25 mg total) by mouth daily. Patient not taking: Reported on 08/17/2019 04/11/19 08/17/19  Mesner, Corene Cornea, MD    Family History Family History  Problem Relation Age of Onset  . Hypertension Mother   . Hypertension Maternal Grandmother   . Cancer Maternal Grandfather   . Hypertension Father     Social History Social History   Tobacco Use  . Smoking status: Current Every Day Smoker    Packs/day: 0.25    Types: Cigars    Last attempt to quit: 08/13/2013    Years since quitting: 7.1  . Smokeless tobacco: Never Used  . Tobacco comment: black and milds; 2-3 per day  Vaping Use  . Vaping Use: Never used  Substance Use Topics  . Alcohol use: No  . Drug use: Yes    Frequency: 1.0 times per week    Types: Marijuana     Allergies   Amoxicillin   Review of Systems Review of Systems  Constitutional: Positive for fever. Negative for activity change, appetite change, chills and fatigue.  HENT: Positive for congestion, rhinorrhea, sinus pressure and sore throat. Negative for ear pain and trouble swallowing.   Eyes: Negative for discharge and redness.  Respiratory: Positive for cough. Negative for chest tightness and shortness of breath.   Cardiovascular: Negative for chest pain.  Gastrointestinal: Negative for abdominal pain, diarrhea, nausea and vomiting.  Musculoskeletal: Negative for myalgias.  Skin: Negative for rash.  Neurological: Negative for dizziness, light-headedness and headaches.     Physical Exam Triage Vital Signs ED Triage Vitals  Enc Vitals Group     BP 09/18/20 1126 (!) 164/85     Pulse Rate 09/18/20 1126 87     Resp 09/18/20 1126 19     Temp 09/18/20 1126 98 F (36.7 C)     Temp Source 09/18/20 1126 Oral     SpO2 09/18/20 1126 99 %     Weight 09/18/20 1127 112 lb (50.8 kg)     Height 09/18/20 1127 4\' 9"  (1.448 m)     Head Circumference --      Peak Flow --      Pain Score 09/18/20 1126 5     Pain Loc --      Pain Edu? --       Excl. in [place]? --    No data found.  Updated Vital Signs BP (!) 164/85 (BP Location: Right Arm)   Pulse 87   Temp 98 F (36.7 C) (Oral)   Resp 19   Ht 4\' 9"  (1.448 m)   Wt 112 lb (50.8 kg)   LMP 06/15/2020   SpO2 99%   BMI 24.24 kg/m   Visual  Acuity Right Eye Distance:   Left Eye Distance:   Bilateral Distance:    Right Eye Near:   Left Eye Near:    Bilateral Near:     Physical Exam Vitals and nursing note reviewed.  Constitutional:      Appearance: She is well-developed and well-nourished.     Comments: No acute distress  HENT:     Head: Normocephalic and atraumatic.     Ears:     Comments: Bilateral ears without tenderness to palpation of external auricle, tragus and mastoid, EAC's without erythema or swelling, TM's with good bony landmarks and cone of light. Non erythematous.     Nose: Nose normal.     Mouth/Throat:     Comments: Oral mucosa pink and moist, no tonsillar enlargement or exudate. Posterior pharynx patent and nonerythematous, no uvula deviation or swelling. Normal phonation. Eyes:     Conjunctiva/sclera: Conjunctivae normal.  Cardiovascular:     Rate and Rhythm: Normal rate.  Pulmonary:     Effort: Pulmonary effort is normal. No respiratory distress.     Comments: Breathing comfortably at rest, inconsistent coarseness noted bilateral lungs Abdominal:     General: There is no distension.  Musculoskeletal:        General: Normal range of motion.     Cervical back: Neck supple.  Skin:    General: Skin is warm and dry.  Neurological:     Mental Status: She is alert and oriented to person, place, and time.  Psychiatric:        Mood and Affect: Mood and affect normal.      UC Treatments / Results  Labs (all labs ordered are listed, but only abnormal results are displayed) Labs Reviewed  NOVEL CORONAVIRUS, NAA    EKG   Radiology No results found.  Procedures Procedures (including critical care time)  Medications Ordered in  UC Medications - No data to display  Initial Impression / Assessment and Plan / UC Course  I have reviewed the triage vital signs and the nursing notes.  Pertinent labs & imaging results that were available during my care of the patient were reviewed by me and considered in my medical decision making (see chart for details).     Viral URI with cough-Covid test pending, provided 3 days of prednisone to help with reported wheezing/coarseness in lungs, recommend symptomatic and supportive care, provided list of medicines safe in pregnancy to use.  Low suspicion of pneumonia at this time.  Continue to monitor.  Discussed strict return precautions. Patient verbalized understanding and is agreeable with plan.  Final Clinical Impressions(s) / UC Diagnoses   Final diagnoses:  Body aches  Viral URI with cough     Discharge Instructions     Covid test pending, monitor my chart for results Tylenol for fever, do not use ibuprofen Prednisone 30 mg daily for 3 days Mucinex DM for cough and congestion Flonase for further congestion May use was provided for other over-the-counter medicine safe to use in pregnancy Please keep close eye on breathing and symptoms, follow-up if not improving or worsening    ED Prescriptions    Medication Sig Dispense Auth. Provider   dextromethorphan-guaiFENesin (MUCINEX DM) 30-600 MG 12hr tablet Take 1 tablet by mouth 2 (two) times daily. 20 tablet Suleyma Wafer C, PA-C   fluticasone (FLONASE) 50 MCG/ACT nasal spray Place 1-2 sprays into both nostrils daily. 16 g Rashi Giuliani C, PA-C   predniSONE (DELTASONE) 10 MG tablet Take 3 tablets (30 mg  total) by mouth daily with breakfast for 3 days. 9 tablet Alyric Parkin, [place] C, PA-C     PDMP not reviewed this encounter.   Janith Lima, PA-C 09/18/20 1310

## 2020-09-18 NOTE — ED Triage Notes (Signed)
Patient c/o chills, fever, and productive cough w/ "yellow" sputum x 2 days.   Patient is [redacted] weeks pregnant, delivery date is in August.    Patient hasn't used any medications or alternative treatment methods.   History of Pneumonia.

## 2020-09-21 ENCOUNTER — Other Ambulatory Visit: Payer: Self-pay

## 2020-09-21 ENCOUNTER — Ambulatory Visit (INDEPENDENT_AMBULATORY_CARE_PROVIDER_SITE_OTHER): Payer: Self-pay

## 2020-09-21 VITALS — BP 113/69 | HR 94

## 2020-09-21 DIAGNOSIS — Z0130 Encounter for examination of blood pressure without abnormal findings: Secondary | ICD-10-CM

## 2020-09-21 NOTE — Progress Notes (Signed)
Pt is here today for BP check following elevated BP check at pregnancy confirmation visit on 09/14/20. Pt started labetalol 200 mg BID. BP today is 113/69. Pt reports ongoing nausea. Pt states she was seen at urgent care for a COVID test on 09/18/20. When asked about symptoms patient reports continued cough and congestion. States she does not have a fever anymore. Explained to patient that her test is still processing so we cannot rule out the possibility of COVID. Pt asks if she should keep new OB appt on 09/28/20 as this will be outside of her quarantine window as long as she has been fever free for 24 hours. Reviewed with Roselie Awkward, MD who states pt may continue BP med as ordered.  Apolonio Schneiders RN 09/21/20

## 2020-09-22 LAB — NOVEL CORONAVIRUS, NAA: SARS-CoV-2, NAA: DETECTED — AB

## 2020-09-23 ENCOUNTER — Telehealth: Payer: Self-pay

## 2020-09-23 NOTE — Telephone Encounter (Signed)
Attempted to reach pt. In regard to COVID 19 additional treatments. Left message with infusion number [id number].

## 2020-09-23 NOTE — Telephone Encounter (Signed)
Pt. Called for COVID results.  Advised that she tested positive; the virus was detected.  Advised pt. of COVID results; the virus was detected, and the pt. can spread the germ to other people.  Pt. Reported she has been going to work and her children have been going to school.  Advised to notify anyone that has had direct contact with her in past 6 days, due to their exposure to the virus.   Reported onset of symptoms of fever, sore throat, shortness of breath, and cough on 09/17/20.  Advised of CDC guidelines for self isolation/ ending isolation.  Advised of safe practice guidelines; frequent handwashing, wearing mask when in proximity of other members in same household, and disinfecting commonly touched surfaces, within the home environment.  Symptom Tier reviewed; encouraged to monitor for worsening symptoms, and to contact their PCP.  Advised that pt. can take OTC medication to treat the symptoms.  Be sure to follow any guidelines from PCP re: the use of OTC medications.  Advised when to seek emergency care.  Instructed to rest and hydrate well and to monitor temperature daily.  Advised to only leave the house during recommended isolation period, if it is necessary to seek medical care.  Advised the Health Dept. will be notified.  Advised of need for other members within same household to quarantine x 14 days, and to get tested 5-6 days from exposure date.  Pt. Verb. understanding of instructions.

## 2020-09-28 ENCOUNTER — Encounter: Payer: Self-pay | Admitting: Obstetrics and Gynecology

## 2020-09-29 ENCOUNTER — Encounter: Payer: Self-pay | Admitting: Obstetrics and Gynecology

## 2020-10-06 NOTE — Progress Notes (Signed)
Patient was assessed and managed by nursing staff during this encounter. I have reviewed the chart and agree with the documentation and plan. I have also made any necessary editorial changes.  Emeterio Reeve, MD 10/06/2020 9:20 AM  Patient ID: Marissa Meyer, female   DOB: Apr 02, 1981, 40 y.o.   MRN: [id number]

## 2020-10-07 ENCOUNTER — Other Ambulatory Visit: Payer: Self-pay | Admitting: Nurse Practitioner

## 2020-10-07 DIAGNOSIS — O10019 Pre-existing essential hypertension complicating pregnancy, unspecified trimester: Secondary | ICD-10-CM

## 2020-10-09 ENCOUNTER — Ambulatory Visit (INDEPENDENT_AMBULATORY_CARE_PROVIDER_SITE_OTHER): Payer: Self-pay | Admitting: Obstetrics & Gynecology

## 2020-10-09 ENCOUNTER — Other Ambulatory Visit (HOSPITAL_COMMUNITY)
Admission: RE | Admit: 2020-10-09 | Discharge: 2020-10-09 | Disposition: A | Discharge status: Home / Self Care | Payer: Self-pay | Source: Ambulatory Visit | Attending: Obstetrics & Gynecology | Admitting: Obstetrics & Gynecology

## 2020-10-09 ENCOUNTER — Encounter: Payer: Self-pay | Admitting: Obstetrics & Gynecology

## 2020-10-09 ENCOUNTER — Other Ambulatory Visit: Payer: Self-pay

## 2020-10-09 VITALS — BP 121/82 | HR 106 | Wt 109.3 lb

## 2020-10-09 DIAGNOSIS — F1911 Other psychoactive substance abuse, in remission: Secondary | ICD-10-CM | POA: Insufficient documentation

## 2020-10-09 DIAGNOSIS — Z8751 Personal history of pre-term labor: Secondary | ICD-10-CM

## 2020-10-09 DIAGNOSIS — Z98891 History of uterine scar from previous surgery: Secondary | ICD-10-CM

## 2020-10-09 DIAGNOSIS — O0990 Supervision of high risk pregnancy, unspecified, unspecified trimester: Secondary | ICD-10-CM | POA: Insufficient documentation

## 2020-10-09 DIAGNOSIS — Z3A09 9 weeks gestation of pregnancy: Secondary | ICD-10-CM

## 2020-10-09 DIAGNOSIS — O10911 Unspecified pre-existing hypertension complicating pregnancy, first trimester: Secondary | ICD-10-CM

## 2020-10-09 DIAGNOSIS — O99330 Smoking (tobacco) complicating pregnancy, unspecified trimester: Secondary | ICD-10-CM

## 2020-10-09 MED ORDER — ONDANSETRON 4 MG PO TBDP: 4.0000 mg | ORAL_TABLET | Freq: Three times a day (TID) | ORAL | 0 refills | Status: DC | PRN

## 2020-10-09 MED ORDER — FLUTICASONE PROPIONATE 50 MCG/ACT NA SUSP
1.0000 | Freq: Every day | NASAL | 3 refills | Status: DC
Start: 2020-10-09 — End: 2021-02-16

## 2020-10-09 MED ORDER — FAMOTIDINE 20 MG PO TABS: 20.0000 mg | ORAL_TABLET | Freq: Two times a day (BID) | ORAL | 4 refills | Status: DC

## 2020-10-09 NOTE — Progress Notes (Signed)
History:   Marissa Meyer is a 40 y.o. [phone] at 20w5dby LMP being seen today for her first obstetrical visit.  Her obstetrical history is significant for being AMA, h/o cocaine use with prior pregnancy, chronic hypertension. Patient does not intend to breast feed. Pregnancy history fully reviewed.   Patient reports heartburn and nausea.   She is having some vaginal discharge.  Denies vaginal bleeding.    HISTORY: OB History  Gravida Para Term Preterm AB Living  6 4 0 _0 SAB IAB Ectopic Multiple Live Births  0 0 1 0 3    # Outcome Date GA Lbr Len/2nd Weight Sex Delivery Anes PTL Lv  6 Current           5 Preterm 09/19/15 374w6d2 lb 14.4 oz (1.315 kg) M Vag-Spont None  LIV     Name: WANITYA, CAUTHON   Apgar1: 5  Apgar5: 8  4 Preterm 03/21/14 3449w1d lb 14.4 oz (1.769 kg) M CS-LTranv Spinal  LIV     Name: WALArmando Gang Preterm      Vag-Spont     2 Ectopic           1 Preterm     F Vag-Spont   LIV    Last pap smear was done 2015 and was normal  Past Medical History:  Diagnosis Date  . Anemia   . Chlamydia   . COVID-19   . Ectopic pregnancy   . Gonorrhea   . Hypertension   . Mental disorder   . Migraines    but not diagnosed  . Pregnancy induced hypertension   . Preterm delivery   . Trichomonas    Past Surgical History:  Procedure Laterality Date  . CESAREAN SECTION N/A 03/21/2014   Procedure: CESAREAN SECTION;  Surgeon: UgoOsborne OmanD;  Location: [facility];  Service: Obstetrics;  Laterality: N/A;  . DILATION AND CURETTAGE OF UTERUS    . ECTOPIC PREGNANCY SURGERY     Family History  Problem Relation Age of Onset  . Hypertension Mother   . Hypertension Maternal Grandmother   . Cancer Maternal Grandfather   . Hypertension Father    Social History   Tobacco Use  . Smoking status: Current Every Day Smoker    Packs/day: 0.25    Types: Cigars    Last attempt to quit: 08/13/2013    Years since quitting: 7.1  . Smokeless tobacco: Never Used  .  Tobacco comment: black and milds; 2-3 per day  Vaping Use  . Vaping Use: Never used  Substance Use Topics  . Alcohol use: No  . Drug use: Yes    Frequency: 1.0 times per week    Types: Marijuana   Allergies  Allergen Reactions  . Amoxicillin Hives    Has patient had a PCN reaction causing immediate rash, facial/tongue/throat swelling, SOB or lightheadedness with hypotension: Yes Has patient had a PCN reaction causing severe rash involving mucus membranes or skin necrosis: No Has patient had a PCN reaction that required hospitalization No Has patient had a PCN reaction occurring within the last 10 years: Yes If all of the above answers are "NO", then may proceed with Cephalosporin use.   Current Outpatient Medications on File Prior to Visit  Medication Sig Dispense Refill  . albuterol (VENTOLIN HFA) 108 (90 Base) MCG/ACT inhaler Inhale 2 puffs into the lungs every 6 (six) hours as needed for wheezing or shortness of  breath. 18 g 0  . labetalol (NORMODYNE) 200 MG tablet Take 1 tablet (200 mg total) by mouth 2 (two) times daily. 60 tablet 0  . Prenatal Vit-Fe Fumarate-FA (PRENATAL PO) Take by mouth.    . Spacer/Aero-Holding Chambers (AEROCHAMBER PLUS) inhaler Use as instructed (Patient not taking: Reported on 09/21/2020) 1 each 2  . [DISCONTINUED] atorvastatin (LIPITOR) 20 MG tablet Take 1 tablet (20 mg total) by mouth daily. (Patient not taking: Reported on 08/17/2019) 90 tablet 3  . [DISCONTINUED] hydrochlorothiazide (HYDRODIURIL) 25 MG tablet Take 1 tablet (25 mg total) by mouth daily. (Patient not taking: Reported on 08/17/2019) 30 tablet 0   No current facility-administered medications on file prior to visit.    Review of Systems Pertinent items noted in HPI and remainder of comprehensive ROS otherwise negative.  Physical Exam:   Vitals:   10/09/20 1040  BP: 121/82  Pulse: (!) 106  Weight: 109 lb 4.8 oz (49.6 kg)     Bedside Ultrasound for FHR check: Viable intrauterine  pregnancy with positive cardiac activity noted, fetal heart rate 155bpm Patient informed that the ultrasound is considered a limited obstetric ultrasound and is not intended to be a complete ultrasound exam.  Patient also informed that the ultrasound is not being completed with the intent of assessing for fetal or placental anomalies or any pelvic abnormalities.  Explained that the purpose of today's ultrasound is to assess for fetal heart rate.  Patient acknowledges the purpose of the exam and the limitations of the study. General: well-developed, well-nourished female in no acute distress  Breasts:  normal appearance, no masses or tenderness bilaterally  Skin: normal coloration and turgor, no rashes  Neurologic: oriented, normal, negative, normal mood  Extremities: normal strength, tone, and muscle mass, ROM of all joints is normal  HEENT PERRLA, extraocular movement intact and sclera clear, anicteric  Neck supple and no masses  Cardiovascular: regular rate and rhythm  Respiratory:  no respiratory distress, normal breath sounds  Abdomen: soft, non-tender; bowel sounds normal; no masses,  no organomegaly  Pelvic: normal external genitalia, no lesions, normal vaginal mucosa, normal vaginal discharge, normal cervix, pap smear done. Uterine size:  10 weeks    Assessment:    Pregnancy: [id number] Patient Active Problem List   Diagnosis Date Noted  . Supervision of high risk pregnancy, antepartum 10/09/2020  . History of substance abuse ([place]) 10/09/2020  . Chronic hypertension with exacerbation during pregnancy 09/19/2015  . History of preterm labor 02/20/2014  . Tobacco use in pregnancy, antepartum 02/20/2014     Plan:    1. [redacted] weeks gestation of pregnancy - on PNV - recommended starting baby ASA after 12 weeks - Prenatal Vit-Fe Fumarate-FA (PRENATAL PO); Take by mouth. - Return 4 weeks.  Will plan genetic testing at that time.  2. Supervision of high risk pregnancy, antepartum - Cytology  - PAP( [place]) - CBC/D/Plt+RPR+Rh+ABO+Rub Ab... - Culture, OB Urine - CHL AMB BABYSCRIPTS SCHEDULE OPTIMIZATION - [place] MFM OB DETAIL +14 WK; Future - Cervicovaginal ancillary only( [place]) - Hemoglobin A1c - Comp Met (CMET) - Protein / creatinine ratio, urine - famotidine (PEPCID) 20 MG tablet; Take 1 tablet (20 mg total) by mouth 2 (two) times daily.  Dispense: 60 tablet; Refill: 4 - ondansetron (ZOFRAN ODT) 4 MG disintegrating tablet; Take 1 tablet (4 mg total) by mouth every 8 (eight) hours as needed for nausea or vomiting.  Dispense: 20 tablet; Refill: 0  3. Chronic hypertension with exacerbation during pregnancy in first trimester -  baseline labs, protein/creatinine obtained - on labetolol  4. History of cesarean delivery -h/o successful VBAC, planning TOL with this pregnancy  5. Tobacco use in pregnancy, antepartum - pt states she has stopped smoking  6.  H/o PTL - consider Makena at 16 weeks  Initial labs drawn. Continue prenatal vitamins. Problem list reviewed and updated. Genetic Screening discussed, NIPS: will obtained at next visit. Ultrasound discussed; fetal anatomic survey: requested. Anticipatory guidance about prenatal visits given including labs, ultrasounds, and testing. Discussed usage of Babyscripts and virtual visits as additional source of managing and completing prenatal visits in midst of coronavirus and pandemic.   Encouraged to complete MyChart Registration for her ability to review results, send requests, and have questions addressed.  The nature of [place] for [facility]Faculty Practice with multiple MDs and Advanced Practice Providers was explained to patient; also emphasized that residents, students are part of our team. Routine obstetric precautions reviewed. Encouraged to seek out care at office or emergency room [facility] preferred) for urgent and/or emergent concerns.  Return in about 4 weeks (around 11/06/2020) for  Office Ob visit (MD only).     Felipa Emory, MD, [place], [facility] for [facility], [place]

## 2020-10-10 LAB — CBC/D/PLT+RPR+RH+ABO+RUB AB...
Antibody Screen: NEGATIVE
Basophils Absolute: 0 10*3/uL (ref 0.0–0.2)
Basos: 0 %
EOS (ABSOLUTE): 0.1 10*3/uL (ref 0.0–0.4)
Eos: 1 %
HCV Ab: <0.1 s/co ratio (ref 0.0–0.9)
HIV Screen 4th Generation wRfx: NONREACTIVE
Hematocrit: 34.2 % (ref 34.0–46.6)
Hemoglobin: 11.9 g/dL (ref 11.1–15.9)
Hepatitis B Surface Ag: NEGATIVE
Immature Grans (Abs): 0 10*3/uL (ref 0.0–0.1)
Immature Granulocytes: 0 %
Lymphocytes Absolute: 1.8 10*3/uL (ref 0.7–3.1)
Lymphs: 18 %
MCH: 32 pg (ref 26.6–33.0)
MCHC: 34.8 g/dL (ref 31.5–35.7)
MCV: 92 fL (ref 79–97)
Monocytes Absolute: 0.6 10*3/uL (ref 0.1–0.9)
Monocytes: 7 %
Neutrophils Absolute: 7.3 10*3/uL — ABNORMAL HIGH (ref 1.4–7.0)
Neutrophils: 74 %
Platelets: 210 10*3/uL (ref 150–450)
RBC: 3.72 x10E6/uL — ABNORMAL LOW (ref 3.77–5.28)
RDW: 12.7 % (ref 11.7–15.4)
RPR Ser Ql: NONREACTIVE
Rh Factor: POSITIVE
Rubella Antibodies, IGG: 5.01 index (ref >0.99)
WBC: 9.7 10*3/uL (ref 3.4–10.8)

## 2020-10-10 LAB — COMPREHENSIVE METABOLIC PANEL
ALT: 12 IU/L (ref 0–32)
AST: 18 IU/L (ref 0–40)
Albumin/Globulin Ratio: 1.8 (ref 1.2–2.2)
Albumin: 4.2 g/dL (ref 3.8–4.8)
Alkaline Phosphatase: 68 IU/L (ref 44–121)
BUN/Creatinine Ratio: 12 (ref 9–23)
BUN: 10 mg/dL (ref 6–20)
Bilirubin Total: <0.2 mg/dL (ref 0.0–1.2)
CO2: 20 mmol/L (ref 20–29)
Calcium: 9.5 mg/dL (ref 8.7–10.2)
Chloride: 100 mmol/L (ref 96–106)
Creatinine, Ser: 0.86 mg/dL (ref 0.57–1.00)
GFR calc Af Amer: 98 mL/min/{1.73_m2} (ref >59)
GFR calc non Af Amer: 85 mL/min/{1.73_m2} (ref >59)
Globulin, Total: 2.3 g/dL (ref 1.5–4.5)
Glucose: 93 mg/dL (ref 65–99)
Potassium: 3.3 mmol/L — ABNORMAL LOW (ref 3.5–5.2)
Sodium: 136 mmol/L (ref 134–144)
Total Protein: 6.5 g/dL (ref 6.0–8.5)

## 2020-10-10 LAB — PROTEIN / CREATININE RATIO, URINE
Creatinine, Urine: 443.7 mg/dL
Protein, Ur: 34.9 mg/dL
Protein/Creat Ratio: 79 mg/g creat (ref 0–200)

## 2020-10-10 LAB — HEMOGLOBIN A1C
Est. average glucose Bld gHb Est-mCnc: 120 mg/dL
Hgb A1c MFr Bld: 5.8 % — ABNORMAL HIGH (ref 4.8–5.6)

## 2020-10-10 LAB — HCV INTERPRETATION

## 2020-10-11 LAB — CULTURE, OB URINE

## 2020-10-11 LAB — URINE CULTURE, OB REFLEX

## 2020-10-12 ENCOUNTER — Telehealth: Payer: Self-pay

## 2020-10-12 LAB — CERVICOVAGINAL ANCILLARY ONLY
Bacterial Vaginitis (gardnerella): POSITIVE — AB
Candida Glabrata: NEGATIVE
Candida Vaginitis: POSITIVE — AB
Chlamydia: NEGATIVE
Comment: NEGATIVE
Comment: NEGATIVE
Comment: NEGATIVE
Comment: NEGATIVE
Comment: NEGATIVE
Comment: NORMAL
Neisseria Gonorrhea: NEGATIVE
Trichomonas: POSITIVE — AB

## 2020-10-12 NOTE — Telephone Encounter (Signed)
Attempted to call pt with results. No answer and no VM box. Will send pt mychart message with results.

## 2020-10-12 NOTE — Telephone Encounter (Signed)
-----   Message from Megan Salon, MD sent at 10/12/2020  5:14 PM EST ----- Please let pt know her new ob labs were good.  HbA1C was 5.8 so this is good.  Vaginal testing did show trichomonas, yeast and BV.  Should treat with flagyl 500mg  bid x 7 days and Terazol 7, one applicator nightly x 7 nights.  Should repeat trichomonas testing in one month.  Partner should be treated and she should not have intercourse again until both are treated.

## 2020-10-13 LAB — CYTOLOGY - PAP
Comment: NEGATIVE
Diagnosis: NEGATIVE
High risk HPV: NEGATIVE

## 2020-10-13 MED ORDER — METRONIDAZOLE 500 MG PO TABS: 500.0000 mg | ORAL_TABLET | Freq: Two times a day (BID) | ORAL | 0 refills | Status: DC

## 2020-10-13 MED ORDER — TERCONAZOLE 0.4 % VA CREA: 1.0000 | TOPICAL_CREAM | Freq: Every day | VAGINAL | 0 refills | Status: AC

## 2020-10-13 NOTE — Telephone Encounter (Signed)
Attempted to call pt and all contacts with no answer and no VM to leave a message. Will send Mychart message to pt and will advise to return call with any questions or concerns.  Rx sent per Dr Sabra Heck to pt pharmacy on file.  Encounter closed

## 2020-10-13 NOTE — Telephone Encounter (Signed)
Left message for pt to return call to office and speak with RN Colletta [place].

## 2020-10-29 ENCOUNTER — Telehealth: Payer: Self-pay

## 2020-10-29 NOTE — Telephone Encounter (Signed)
LVM making patient aware of Anatomy ultrasound appointment for 12/14/20 with [place] at 9:30am.  Please call office if you have any questions.

## 2020-11-06 ENCOUNTER — Encounter: Payer: Self-pay | Admitting: Obstetrics and Gynecology

## 2020-12-14 ENCOUNTER — Encounter: Payer: Self-pay | Admitting: *Deleted

## 2020-12-14 ENCOUNTER — Other Ambulatory Visit: Payer: Self-pay

## 2020-12-14 ENCOUNTER — Ambulatory Visit: Payer: Self-pay | Admitting: *Deleted

## 2020-12-14 ENCOUNTER — Other Ambulatory Visit: Payer: Self-pay | Admitting: *Deleted

## 2020-12-14 ENCOUNTER — Ambulatory Visit: Payer: Self-pay | Attending: Obstetrics & Gynecology

## 2020-12-14 DIAGNOSIS — O0990 Supervision of high risk pregnancy, unspecified, unspecified trimester: Secondary | ICD-10-CM

## 2020-12-14 DIAGNOSIS — O10912 Unspecified pre-existing hypertension complicating pregnancy, second trimester: Secondary | ICD-10-CM

## 2020-12-21 ENCOUNTER — Other Ambulatory Visit: Payer: Self-pay

## 2020-12-21 ENCOUNTER — Other Ambulatory Visit (HOSPITAL_COMMUNITY)
Admission: RE | Admit: 2020-12-21 | Discharge: 2020-12-21 | Disposition: A | Discharge status: Home / Self Care | Payer: Medicaid Other | Source: Ambulatory Visit | Attending: Obstetrics and Gynecology | Admitting: Obstetrics and Gynecology

## 2020-12-21 ENCOUNTER — Ambulatory Visit (INDEPENDENT_AMBULATORY_CARE_PROVIDER_SITE_OTHER): Payer: Medicaid Other | Admitting: Obstetrics and Gynecology

## 2020-12-21 VITALS — BP 150/104 | HR 102 | Wt 114.4 lb

## 2020-12-21 DIAGNOSIS — Z8619 Personal history of other infectious and parasitic diseases: Secondary | ICD-10-CM | POA: Insufficient documentation

## 2020-12-21 DIAGNOSIS — O0990 Supervision of high risk pregnancy, unspecified, unspecified trimester: Secondary | ICD-10-CM

## 2020-12-21 DIAGNOSIS — F1911 Other psychoactive substance abuse, in remission: Secondary | ICD-10-CM | POA: Diagnosis not present

## 2020-12-21 DIAGNOSIS — Z8751 Personal history of pre-term labor: Secondary | ICD-10-CM

## 2020-12-21 DIAGNOSIS — R7303 Prediabetes: Secondary | ICD-10-CM

## 2020-12-21 DIAGNOSIS — O10912 Unspecified pre-existing hypertension complicating pregnancy, second trimester: Secondary | ICD-10-CM

## 2020-12-21 DIAGNOSIS — O09523 Supervision of elderly multigravida, third trimester: Secondary | ICD-10-CM | POA: Insufficient documentation

## 2020-12-21 DIAGNOSIS — Z98891 History of uterine scar from previous surgery: Secondary | ICD-10-CM

## 2020-12-21 DIAGNOSIS — O09522 Supervision of elderly multigravida, second trimester: Secondary | ICD-10-CM

## 2020-12-21 MED ORDER — ASPIRIN EC 81 MG PO TBEC: 81.0000 mg | DELAYED_RELEASE_TABLET | Freq: Every day | ORAL | 2 refills | Status: DC

## 2020-12-21 NOTE — Progress Notes (Signed)
Doing self swab today for TOC.

## 2020-12-21 NOTE — Progress Notes (Signed)
PRENATAL VISIT NOTE  Subjective:  Marissa Meyer is a 40 y.o. [phone] at [redacted]w[redacted]d being seen today for ongoing prenatal care.  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  Patient reports no complaints.  Contractions: Irritability. Vag. Bleeding: None.  Movement: Present. Denies leaking of fluid.   The following portions of the patient's history were reviewed and updated as appropriate: allergies, current medications, past family history, past medical history, past social history, past surgical history and problem list.   Objective:   Vitals:   12/21/20 0952  BP: (!) 150/104  Pulse: (!) 102  Weight: 114 lb 6.4 oz (51.9 kg)    Fetal Status: Fetal Heart Rate (bpm): 157   Movement: Present     General:  Alert, oriented and cooperative. Patient is in no acute distress.  Skin: Skin is warm and dry. No rash noted.   Cardiovascular: Normal heart rate noted  Respiratory: Normal respiratory effort, no problems with respiration noted  Abdomen: Soft, gravid, appropriate for gestational age.  Pain/Pressure: Present     Pelvic: Cervical exam deferred        Extremities: Normal range of motion.  Edema: None  Mental Status: Normal mood and affect. Normal behavior. Normal judgment and thought content.   Assessment and Plan:  Pregnancy: [id number] at [redacted]w[redacted]d 1. History of trichomoniasis toc today - Cervicovaginal ancillary only( [place])  2. Chronic hypertension with exacerbation during pregnancy in second trimester Pt forgot her bp med this morning. She states she only takes one pill in the morning. Labetalol  200 bid was sent in for her previously. She denies any pre-eclampsia s/s. I told her pre-eclampsia precuations and recommend she take the labetalol when she get home and call us for any elevated BPs (see pt instructions) and also to start taking one pill at night before bed time; also recommended to pt start an aspirin daily  3. Supervision of high risk pregnancy, antepartum a1c pre-diabetes at nob. Pt states she can't stay for 1hr. Will do next week at her BP check  Sign BTL papers nv  Normal growth on 4/4. Has rpt on 5/2 - TSH; Future - Glucose tolerance, 1 hour; Future - CMP and Liver; Future  4. History of substance abuse ([place])  5. History of preterm labor G1: 2015: 34wsk severe pre-eclampsia, pLTCS, NRFHT G2: 2017 PTL and birth, no PNC. 1315gm, 2lbs 14oz, ?35wks. +cocaine UDS  Patient not on 17p and I don't feel this would be a benefit to her based on her history as the first was iatrogenic and 2nd was likely due to cocaine use and lack of prenatal care  Consider screening UDS nv  6. Multigravida of advanced maternal age in second trimester Too late for afp Offer a   7. History of VBAC D/w her re: this later visits and sign consent  8. Prediabetes See above - Glucose tolerance, 1 hour; Future  Preterm labor symptoms and general obstetric precautions including but not limited to vaginal bleeding, contractions, leaking of fluid and fetal movement were reviewed in detail with the patient. Please refer to After Visit Summary for other counseling recommendations.   Return in about 1 week (around 12/28/2020)  for in person, bp check, rn visit, 2hr GTT.  Future Appointments  Date Time Provider Person  01/11/2021  9:15 AM [facility] NURSE [facility]  01/11/2021  9:30 AM [facility] US3 [facility]    Aletha Halim, MD

## 2020-12-21 NOTE — Patient Instructions (Addendum)
Take your one blood pressure pill in the morning when you wake up and one at time before bed time  Start taking the aspirin that was sent in for you today  Call us for any blood pressures above 150 for the top number or above 100 for the bottom number  Also call for any symptoms below:   Hypertension During Pregnancy Hypertension is also called high blood pressure. High blood pressure means that the force of the blood moving in your body is high enough to cause problems for you and your baby. Different types of high blood pressure can happen during pregnancy. The types are:  High blood pressure before you got pregnant. This is called chronic hypertension.  This can continue during your pregnancy. Your doctor will want to keep checking your blood pressure. You may need medicine to control your blood pressure while you are pregnant. You will need follow-up visits after you have your baby.  High blood pressure that goes up during pregnancy when it was normal before. This is called gestational hypertension. It will often get better after you have your baby, but your doctor will need to watch your blood pressure to make sure that it is getting better.  You may develop high blood pressure after giving birth. This is called postpartum hypertension. This often occurs within 48 hours after childbirth but may occur up to 6 weeks after giving birth. Very high blood pressure during pregnancy is an emergency that needs treatment right away. How does this affect me? If you have high blood pressure during pregnancy, you have a higher chance of developing high blood pressure:  As you get older.  If you get pregnant again. In some cases, high blood pressure during pregnancy can cause:  Stroke.  Heart attack.  Damage to the kidneys, lungs, or liver.  Preeclampsia.  HELLP syndrome.  Seizures.  Problems with the placenta. How does this affect my baby? Your baby may:  Be born early.  Not weigh  as much as he or she should.  Not handle labor well, leading to a C-section. This condition may also result in a baby's death before birth (stillbirth). What are the risks?  Having high blood pressure during a past pregnancy.  Being overweight.  Being age 30 or older.  Being pregnant for the first time.  Being pregnant with more than one baby.  Becoming pregnant using fertility methods, such as IVF.  Having other problems, such as diabetes or kidney disease. What can I do to lower my risk?  Keep a healthy weight.  Eat a healthy diet.  Follow what your doctor tells you about treating any medical problems that you had before you got pregnant. It is very important to go to all of your doctor visits. Your doctor will check your blood pressure and make sure that your pregnancy is progressing as it should. Treatment should start early if a problem is found.   How is this treated? Treatment for high blood pressure during pregnancy can vary. It depends on the type of high blood pressure you have and how serious it is.  If you were taking medicine for your blood pressure before you got pregnant, talk with your doctor. You may need to change the medicine during pregnancy if it is not safe for your baby.  If your blood pressure goes up during pregnancy, your doctor may order medicine to treat this.  If you are at risk for preeclampsia, your doctor may tell you to take  a low-dose aspirin while you are pregnant.  If you have very high blood pressure, you may need to stay in the hospital so you and your baby can be watched closely. You may also need to take medicine to lower your blood pressure.  In some cases, if your condition gets worse, you may need to have your baby early. Follow these instructions at home: Eating and drinking  Drink enough fluid to keep your pee (urine) pale yellow.  Avoid caffeine.   Lifestyle  Do not smoke or use any products that contain nicotine or tobacco.  If you need help quitting, ask your doctor.  Do not use alcohol or drugs.  Avoid stress.  Rest and get plenty of sleep.  Regular exercise can help. Ask your doctor what kinds of exercise are best for you. General instructions  Take over-the-counter and prescription medicines only as told by your doctor.  Keep all prenatal and follow-up visits. Contact a doctor if:  You have symptoms that your doctor told you to watch for, such as: ? Headaches. ? A feeling like you may vomit (nausea). ? Vomiting. ? Belly (abdominal) pain. ? Feeling dizzy or light-headed. Get help right away if:  You have symptoms of serious problems, such as: ? Very bad belly pain that does not get better with treatment. ? A very bad headache that does not get better. ? Blurry vision. ? Double vision. ? Vomiting that does not get better. ? Sudden, fast weight gain. ? Sudden swelling in your hands, ankles, or face. ? Bleeding from your vagina. ? Blood in your pee. ? Shortness of breath. ? Chest pain. ? Weakness on one side of your body. ? Trouble talking.  Your baby is not moving as much as usual. These symptoms may be an emergency. Get help right away. Call your local emergency services (911 in the [place]  Do not wait to see if the symptoms will go away.  Do not drive yourself to the hospital. Summary  High blood pressure is also called hypertension.  High blood pressure means that the force of the blood moving in your body is high enough to cause problems for you and your baby.  Get help right away if you have symptoms of serious problems due to high blood pressure.  Keep all prenatal and follow-up visits. This information is not intended to replace advice given to you by your health care provider. Make sure you discuss any questions you have with your health care provider. Document Revised: 05/21/2020 Document Reviewed: 05/21/2020 Elsevier Patient Education  2021 [employer].

## 2020-12-22 LAB — CERVICOVAGINAL ANCILLARY ONLY
Chlamydia: NEGATIVE
Comment: NEGATIVE
Comment: NEGATIVE
Comment: NORMAL
Neisseria Gonorrhea: NEGATIVE
Trichomonas: POSITIVE — AB

## 2020-12-24 ENCOUNTER — Encounter: Payer: Self-pay | Admitting: Obstetrics and Gynecology

## 2020-12-24 DIAGNOSIS — A599 Trichomoniasis, unspecified: Secondary | ICD-10-CM

## 2020-12-24 HISTORY — DX: Trichomoniasis, unspecified: A59.9

## 2020-12-24 MED ORDER — METRONIDAZOLE 500 MG PO TABS: ORAL_TABLET | ORAL | 0 refills | Status: DC

## 2020-12-24 NOTE — Addendum Note (Signed)
Addended by: Aletha Halim on: 12/24/2020 10:37 AM   Modules accepted: Orders

## 2020-12-28 ENCOUNTER — Ambulatory Visit: Payer: Self-pay

## 2020-12-28 ENCOUNTER — Other Ambulatory Visit: Payer: Self-pay

## 2020-12-29 ENCOUNTER — Telehealth: Payer: Self-pay | Admitting: Lactation Services

## 2020-12-29 NOTE — Telephone Encounter (Signed)
Received message that patient has not read My Chart message in regards to + Trich. Attempted to call patient and did not reach her. LM for her to check her My Chart message and call the office for important results. Will send letter.

## 2021-01-04 ENCOUNTER — Encounter: Payer: Self-pay | Admitting: Nurse Practitioner

## 2021-01-11 ENCOUNTER — Other Ambulatory Visit: Payer: Self-pay

## 2021-01-11 ENCOUNTER — Other Ambulatory Visit: Payer: Self-pay | Admitting: *Deleted

## 2021-01-11 ENCOUNTER — Ambulatory Visit: Payer: Medicaid Other | Attending: Obstetrics

## 2021-01-11 ENCOUNTER — Encounter: Payer: Self-pay | Admitting: *Deleted

## 2021-01-11 ENCOUNTER — Ambulatory Visit: Payer: Medicaid Other | Admitting: *Deleted

## 2021-01-11 DIAGNOSIS — O10912 Unspecified pre-existing hypertension complicating pregnancy, second trimester: Secondary | ICD-10-CM | POA: Diagnosis present

## 2021-01-11 DIAGNOSIS — O09522 Supervision of elderly multigravida, second trimester: Secondary | ICD-10-CM

## 2021-01-11 DIAGNOSIS — O09212 Supervision of pregnancy with history of pre-term labor, second trimester: Secondary | ICD-10-CM

## 2021-01-11 DIAGNOSIS — O10919 Unspecified pre-existing hypertension complicating pregnancy, unspecified trimester: Secondary | ICD-10-CM

## 2021-01-11 DIAGNOSIS — O10012 Pre-existing essential hypertension complicating pregnancy, second trimester: Secondary | ICD-10-CM | POA: Diagnosis not present

## 2021-01-11 DIAGNOSIS — O34219 Maternal care for unspecified type scar from previous cesarean delivery: Secondary | ICD-10-CM

## 2021-01-11 DIAGNOSIS — O0990 Supervision of high risk pregnancy, unspecified, unspecified trimester: Secondary | ICD-10-CM

## 2021-01-11 DIAGNOSIS — Z3A24 24 weeks gestation of pregnancy: Secondary | ICD-10-CM

## 2021-01-18 ENCOUNTER — Other Ambulatory Visit: Payer: Self-pay

## 2021-01-18 ENCOUNTER — Encounter: Payer: Self-pay | Admitting: Nurse Practitioner

## 2021-01-18 ENCOUNTER — Inpatient Hospital Stay (HOSPITAL_COMMUNITY)
Admission: AD | Admit: 2021-01-18 | Discharge: 2021-01-19 | Disposition: A | Discharge status: Home / Self Care | Payer: Medicaid Other | Attending: Family Medicine | Admitting: Family Medicine

## 2021-01-18 ENCOUNTER — Encounter (HOSPITAL_COMMUNITY): Payer: Self-pay | Admitting: Family Medicine

## 2021-01-18 DIAGNOSIS — B3731 Acute candidiasis of vulva and vagina: Secondary | ICD-10-CM

## 2021-01-18 DIAGNOSIS — O10919 Unspecified pre-existing hypertension complicating pregnancy, unspecified trimester: Secondary | ICD-10-CM

## 2021-01-18 DIAGNOSIS — O219 Vomiting of pregnancy, unspecified: Secondary | ICD-10-CM

## 2021-01-18 DIAGNOSIS — O09522 Supervision of elderly multigravida, second trimester: Secondary | ICD-10-CM | POA: Diagnosis not present

## 2021-01-18 DIAGNOSIS — Z880 Allergy status to penicillin: Secondary | ICD-10-CM | POA: Diagnosis not present

## 2021-01-18 DIAGNOSIS — O99332 Smoking (tobacco) complicating pregnancy, second trimester: Secondary | ICD-10-CM | POA: Insufficient documentation

## 2021-01-18 DIAGNOSIS — O212 Late vomiting of pregnancy: Secondary | ICD-10-CM | POA: Diagnosis present

## 2021-01-18 DIAGNOSIS — B373 Candidiasis of vulva and vagina: Secondary | ICD-10-CM | POA: Diagnosis not present

## 2021-01-18 DIAGNOSIS — F17210 Nicotine dependence, cigarettes, uncomplicated: Secondary | ICD-10-CM | POA: Insufficient documentation

## 2021-01-18 DIAGNOSIS — Z3689 Encounter for other specified antenatal screening: Secondary | ICD-10-CM

## 2021-01-18 DIAGNOSIS — O10912 Unspecified pre-existing hypertension complicating pregnancy, second trimester: Secondary | ICD-10-CM | POA: Insufficient documentation

## 2021-01-18 DIAGNOSIS — Z3A25 25 weeks gestation of pregnancy: Secondary | ICD-10-CM | POA: Diagnosis not present

## 2021-01-18 DIAGNOSIS — O98812 Other maternal infectious and parasitic diseases complicating pregnancy, second trimester: Secondary | ICD-10-CM | POA: Insufficient documentation

## 2021-01-18 DIAGNOSIS — O10012 Pre-existing essential hypertension complicating pregnancy, second trimester: Secondary | ICD-10-CM | POA: Diagnosis not present

## 2021-01-18 LAB — COMPREHENSIVE METABOLIC PANEL
ALT: 11 U/L (ref 0–44)
AST: 14 U/L — ABNORMAL LOW (ref 15–41)
Albumin: 2.8 g/dL — ABNORMAL LOW (ref 3.5–5.0)
Alkaline Phosphatase: 82 U/L (ref 38–126)
Anion gap: 9 (ref 5–15)
BUN: 5 mg/dL — ABNORMAL LOW (ref 6–20)
CO2: 23 mmol/L (ref 22–32)
Calcium: 8.7 mg/dL — ABNORMAL LOW (ref 8.9–10.3)
Chloride: 101 mmol/L (ref 98–111)
Creatinine, Ser: 0.68 mg/dL (ref 0.44–1.00)
GFR, Estimated: >60 mL/min (ref >60)
Glucose, Bld: 87 mg/dL (ref 70–99)
Potassium: 3.2 mmol/L — ABNORMAL LOW (ref 3.5–5.1)
Sodium: 133 mmol/L — ABNORMAL LOW (ref 135–145)
Total Bilirubin: 0.4 mg/dL (ref 0.3–1.2)
Total Protein: 6.1 g/dL — ABNORMAL LOW (ref 6.5–8.1)

## 2021-01-18 LAB — WET PREP, GENITAL
Clue Cells Wet Prep HPF POC: NONE SEEN
Sperm: NONE SEEN
Trich, Wet Prep: NONE SEEN

## 2021-01-18 LAB — URINALYSIS, ROUTINE W REFLEX MICROSCOPIC
Bilirubin Urine: NEGATIVE
Glucose, UA: NEGATIVE mg/dL
Hgb urine dipstick: NEGATIVE
Ketones, ur: NEGATIVE mg/dL
Nitrite: NEGATIVE
Protein, ur: 30 mg/dL — AB
Specific Gravity, Urine: 1.028 (ref 1.005–1.030)
WBC, UA: >50 WBC/hpf — ABNORMAL HIGH (ref 0–5)
pH: 6 (ref 5.0–8.0)

## 2021-01-18 LAB — PROTEIN / CREATININE RATIO, URINE
Creatinine, Urine: 432.19 mg/dL
Protein Creatinine Ratio: 0.04 mg/mg{Cre} (ref 0.00–0.15)
Total Protein, Urine: 19 mg/dL

## 2021-01-18 LAB — CBC
HCT: 35.5 % — ABNORMAL LOW (ref 36.0–46.0)
Hemoglobin: 11.9 g/dL — ABNORMAL LOW (ref 12.0–15.0)
MCH: 31.5 pg (ref 26.0–34.0)
MCHC: 33.5 g/dL (ref 30.0–36.0)
MCV: 93.9 fL (ref 80.0–100.0)
Platelets: 233 10*3/uL (ref 150–400)
RBC: 3.78 MIL/uL — ABNORMAL LOW (ref 3.87–5.11)
RDW: 12.5 % (ref 11.5–15.5)
WBC: 9.8 10*3/uL (ref 4.0–10.5)
nRBC: 0 % (ref 0.0–0.2)

## 2021-01-18 MED ORDER — ONDANSETRON HCL 4 MG/2ML IJ SOLN
4.0000 mg | Freq: Once | INTRAMUSCULAR | Status: AC
  Administered 2021-01-18: 4 mg via INTRAVENOUS
  Filled 2021-01-18: qty 2

## 2021-01-18 MED ORDER — DICYCLOMINE HCL 10 MG/5ML PO SOLN
10.0000 mg | Freq: Once | ORAL | Status: AC
  Administered 2021-01-18: 10 mg via ORAL
  Filled 2021-01-18: qty 5

## 2021-01-18 MED ORDER — FAMOTIDINE IN NACL 20-0.9 MG/50ML-% IV SOLN
20.0000 mg | Freq: Once | INTRAVENOUS | Status: AC
  Administered 2021-01-18: 20 mg via INTRAVENOUS
  Filled 2021-01-18: qty 50

## 2021-01-18 MED ORDER — HYDRALAZINE HCL 20 MG/ML IJ SOLN
10.0000 mg | INTRAMUSCULAR | Status: DC | PRN
  Administered 2021-01-18: 10 mg via INTRAVENOUS
  Filled 2021-01-18: qty 1

## 2021-01-18 MED ORDER — LABETALOL HCL 5 MG/ML IV SOLN
80.0000 mg | INTRAVENOUS | Status: DC | PRN
  Administered 2021-01-18: 80 mg via INTRAVENOUS
  Filled 2021-01-18: qty 16

## 2021-01-18 MED ORDER — LABETALOL HCL 5 MG/ML IV SOLN
40.0000 mg | INTRAVENOUS | Status: DC | PRN
  Administered 2021-01-18: 40 mg via INTRAVENOUS
  Filled 2021-01-18: qty 8

## 2021-01-18 MED ORDER — LIDOCAINE VISCOUS HCL 2 % MT SOLN
15.0000 mL | Freq: Once | OROMUCOSAL | Status: AC
  Administered 2021-01-18: 15 mL via ORAL
  Filled 2021-01-18: qty 15

## 2021-01-18 MED ORDER — LABETALOL HCL 5 MG/ML IV SOLN
20.0000 mg | INTRAVENOUS | Status: DC | PRN
  Administered 2021-01-18: 20 mg via INTRAVENOUS
  Filled 2021-01-18: qty 4

## 2021-01-18 MED ORDER — LACTATED RINGERS IV BOLUS
1000.0000 mL | Freq: Once | INTRAVENOUS | Status: AC
  Administered 2021-01-18: 1000 mL via INTRAVENOUS

## 2021-01-18 MED ORDER — ALUM & MAG HYDROXIDE-SIMETH 200-200-20 MG/5ML PO SUSP
30.0000 mL | Freq: Once | ORAL | Status: AC
  Administered 2021-01-18: 30 mL via ORAL
  Filled 2021-01-18: qty 30

## 2021-01-18 NOTE — MAU Note (Signed)
Pt reports he had severe nausea and vominiting on Saturday. Reports now when she is eating she gets a burning sensation in her epigastric area and makes her cough.  Took pepcid today but has not helped much.  not taken her b/p medication today due to upset stomach.

## 2021-01-18 NOTE — MAU Provider Note (Signed)
Chief Complaint:  Emesis and Heartburn   HPI: Marissa Meyer is a 40 y.o. [phone] at [redacted]w[redacted]d who presents to maternity admissions reporting mild nausea, burning in her chest that radiates to her back now and worsening after she eats. Had 3 episodes of emesis Saturday night into Sunday morning and has noted the burning sensation ever since. Is able to keep food down, but cannot get rid of the burning. Since she has not felt good, she has not taken her hypertension meds today and had severe range BP readings at home. Denies headache or visual disturbances, feeling plenty of fetal movement. No contractions, cramping, vaginal bleeding or loss of fluid. Is having some vaginal discharge.   Pregnancy Course: Receives care at [facility]. Planning to VBAC, has cHTN and hx of trichomonas.  Past Medical History:  Diagnosis Date  . Anemia   . Chlamydia   . COVID-19   . Ectopic pregnancy   . Gonorrhea   . Hypertension   . Mental disorder   . Migraines    but not diagnosed  . Pregnancy induced hypertension   . Preterm delivery   . Trichomonas    OB History  Gravida Para Term Preterm AB Living  6 4 0 4 1 3   SAB IAB Ectopic Multiple Live Births      1 0 3    # Outcome Date GA Lbr Len/2nd Weight Sex Delivery Anes PTL Lv  6 Current           5 Preterm 09/19/15 [redacted]w[redacted]d  2 lb 14.4 oz (1.315 kg) M Vag-Spont None  LIV  4 Preterm 03/21/14 [redacted]w[redacted]d  3 lb 14.4 oz (1.769 kg) M CS-LTranv Spinal  LIV  3 Preterm      Vag-Spont     2 Ectopic           1 Preterm     F Vag-Spont   LIV   Past Surgical History:  Procedure Laterality Date  . CESAREAN SECTION N/A 03/21/2014   Procedure: CESAREAN SECTION;  Surgeon: Osborne Oman, MD;  Location: [facility];  Service: Obstetrics;  Laterality: N/A;  . DILATION AND CURETTAGE OF UTERUS    . ECTOPIC PREGNANCY SURGERY     Family History  Problem Relation Age of Onset  . Hypertension Mother   . Hypertension Maternal Grandmother   . Cancer Maternal Grandfather   . Hypertension  Father    Social History   Tobacco Use  . Smoking status: Current Every Day Smoker    Packs/day: 0.25    Types: Cigars    Last attempt to quit: 08/13/2013    Years since quitting: 7.4  . Smokeless tobacco: Never Used  . Tobacco comment: black and milds; 2-3 per day  Vaping Use  . Vaping Use: Never used  Substance Use Topics  . Alcohol use: No  . Drug use: Yes    Frequency: 1.0 times per week    Types: Marijuana   Allergies  Allergen Reactions  . Amoxicillin Hives    Has patient had a PCN reaction causing immediate rash, facial/tongue/throat swelling, SOB or lightheadedness with hypotension: Yes Has patient had a PCN reaction causing severe rash involving mucus membranes or skin necrosis: No Has patient had a PCN reaction that required hospitalization No Has patient had a PCN reaction occurring within the last 10 years: Yes If all of the above answers are "NO", then may proceed with Cephalosporin use.   Medications Prior to Admission  Medication Sig Dispense Refill Last  Dose  . Prenatal Vit-Fe Fumarate-FA (PRENATAL PO) Take by mouth.   Past Week at Unknown time  . albuterol (VENTOLIN HFA) 108 (90 Base) MCG/ACT inhaler Inhale 2 puffs into the lungs every 6 (six) hours as needed for wheezing or shortness of breath. 18 g 0   . amLODipine (NORVASC) 5 MG tablet TAKE 1 TABLET (5 MG TOTAL) BY MOUTH DAILY. (Patient not taking: Reported on 01/11/2021) 90 tablet 0   . aspirin EC 81 MG tablet Take 1 tablet (81 mg total) by mouth daily. (Patient not taking: Reported on 01/11/2021) 60 tablet 2   . famotidine (PEPCID) 20 MG tablet Take 1 tablet (20 mg total) by mouth 2 (two) times daily. (Patient not taking: Reported on 12/21/2020) 60 tablet 4   . fluticasone (FLONASE) 50 MCG/ACT nasal spray Place 1-2 sprays into both nostrils daily. (Patient not taking: Reported on 12/14/2020) 16 g 3   . labetalol (NORMODYNE) 200 MG tablet Take 1 tablet (200 mg total) by mouth 2 (two) times daily. (Patient not taking:  Reported on 01/11/2021) 60 tablet 0   . metroNIDAZOLE (FLAGYL) 500 MG tablet Take all at once by mouth. (Patient not taking: Reported on 01/11/2021) 4 tablet 0   . ondansetron (ZOFRAN ODT) 4 MG disintegrating tablet Take 1 tablet (4 mg total) by mouth every 8 (eight) hours as needed for nausea or vomiting. (Patient not taking: Reported on 12/14/2020) 20 tablet 0   . Spacer/Aero-Holding Chambers (AEROCHAMBER PLUS) inhaler Use as instructed (Patient not taking: Reported on 09/21/2020) 1 each 2    I have reviewed patient's Past Medical Hx, Surgical Hx, Family Hx, Social Hx, medications and allergies.   ROS:  Review of Systems  Constitutional: Negative for fatigue and fever.  HENT: Negative for congestion and sore throat.   Respiratory: Positive for cough (feels like reflux). Negative for choking.   Cardiovascular: Positive for chest pain (epigastric and esophageal burning).  Gastrointestinal: Positive for nausea and vomiting (5/7-01/17/21 none since).  Genitourinary: Positive for vaginal discharge.  Neurological: Negative for dizziness, syncope, light-headedness and headaches.   Physical Exam   Patient Vitals for the past 24 hrs:  BP Temp Temp src Pulse Resp SpO2 Height Weight  01/19/21 0036 (!) 119/58 98.3 F (36.8 C) Oral 90 17 -- -- --  01/18/21 2330 136/76 -- -- 96 17 -- -- --  01/18/21 2246 123/76 -- -- 94 -- -- -- --  01/18/21 2231 126/86 -- -- (!) 101 -- -- -- --  01/18/21 2216 134/78 -- -- 94 -- -- -- --  01/18/21 2200 128/76 -- -- 94 -- -- -- --  01/18/21 2159 126/79 -- -- 98 -- -- -- --  01/18/21 2145 (!) 143/84 -- -- (!) 102 -- 100 % -- --  01/18/21 2130 130/79 -- -- 92 -- 99 % -- --  01/18/21 2115 133/78 -- -- 96 -- 100 % -- --  01/18/21 2110 -- -- -- -- -- 100 % -- --  01/18/21 2101 (!) 163/92 -- -- 85 -- -- -- --  01/18/21 2046 (!) 173/96 -- -- 92 -- -- -- --  01/18/21 2031 (!) 180/98 -- -- 86 -- -- -- --  01/18/21 2014 (!) 172/112 -- -- (!) 102 -- -- -- --  01/18/21 2005 -- --  -- -- -- -- 4\' 8"  (1.422 m) 116 lb (52.6 kg)  01/18/21 1957 (!) 164/96 98.5 F (36.9 C) -- (!) 103 18 -- -- --   Constitutional: Well-developed, well-nourished female in no  acute distress.  Cardiovascular: normal rate & rhythm, no murmur Respiratory: normal effort, lung sounds clear throughout GI: Abd soft, non-tender, gravid appropriate for gestational age. Pos BS x 4 MS: Extremities nontender, no edema, normal ROM Neurologic: Alert and oriented x 4.  GU: no CVA tenderness Pelvic exam deferred, blind swabs obtained  Fetal Tracing: reactive Baseline: 140  Variability: minimal to moderate Accelerations: 10x10 (appropriate for gestational age) Decelerations: occasional variables Toco: relaxed to UI   Labs: Results for orders placed or performed during the hospital encounter of 01/18/21 (from the past 24 hour(s))  CBC     Status: Abnormal   Collection Time: 01/18/21  8:08 PM  Result Value Ref Range   WBC 9.8 4.0 - 10.5 K/uL   RBC 3.78 (L) 3.87 - 5.11 MIL/uL   Hemoglobin 11.9 (L) 12.0 - 15.0 g/dL   HCT 35.5 (L) 36.0 - 46.0 %   MCV 93.9 80.0 - 100.0 fL   MCH 31.5 26.0 - 34.0 pg   MCHC 33.5 30.0 - 36.0 g/dL   RDW 12.5 11.5 - 15.5 %   Platelets 233 150 - 400 K/uL   nRBC 0.0 0.0 - 0.2 %  Comprehensive metabolic panel     Status: Abnormal   Collection Time: 01/18/21  8:08 PM  Result Value Ref Range   Sodium 133 (L) 135 - 145 mmol/L   Potassium 3.2 (L) 3.5 - 5.1 mmol/L   Chloride 101 98 - 111 mmol/L   CO2 23 22 - 32 mmol/L   Glucose, Bld 87 70 - 99 mg/dL   BUN 5 (L) 6 - 20 mg/dL   Creatinine, Ser 0.68 0.44 - 1.00 mg/dL   Calcium 8.7 (L) 8.9 - 10.3 mg/dL   Total Protein 6.1 (L) 6.5 - 8.1 g/dL   Albumin 2.8 (L) 3.5 - 5.0 g/dL   AST 14 (L) 15 - 41 U/L   ALT 11 0 - 44 U/L   Alkaline Phosphatase 82 38 - 126 U/L   Total Bilirubin 0.4 0.3 - 1.2 mg/dL   GFR, Estimated >60 >60 mL/min   Anion gap 9 5 - 15  Urinalysis, Routine w reflex microscopic Urine, Clean Catch     Status:  Abnormal   Collection Time: 01/18/21  8:11 PM  Result Value Ref Range   Color, Urine AMBER (A) YELLOW   APPearance HAZY (A) CLEAR   Specific Gravity, Urine 1.028 1.005 - 1.030   pH 6.0 5.0 - 8.0   Glucose, UA NEGATIVE NEGATIVE mg/dL   Hgb urine dipstick NEGATIVE NEGATIVE   Bilirubin Urine NEGATIVE NEGATIVE   Ketones, ur NEGATIVE NEGATIVE mg/dL   Protein, ur 30 (A) NEGATIVE mg/dL   Nitrite NEGATIVE NEGATIVE   Leukocytes,Ua LARGE (A) NEGATIVE   RBC / HPF 0-5 0 - 5 RBC/hpf   WBC, UA >50 (H) 0 - 5 WBC/hpf   Bacteria, UA RARE (A) NONE SEEN   Squamous Epithelial / LPF 0-5 0 - 5   WBC Clumps PRESENT    Mucus PRESENT   Protein / creatinine ratio, urine     Status: None   Collection Time: 01/18/21  8:11 PM  Result Value Ref Range   Creatinine, Urine 432.19 mg/dL   Total Protein, Urine 19 mg/dL   Protein Creatinine Ratio 0.04 0.00 - 0.15 mg/mg[Cre]  Wet prep, genital     Status: Abnormal   Collection Time: 01/18/21 10:30 PM   Specimen: Vaginal  Result Value Ref Range   Yeast Wet Prep HPF POC PRESENT (A)  NONE SEEN   Trich, Wet Prep NONE SEEN NONE SEEN   Clue Cells Wet Prep HPF POC NONE SEEN NONE SEEN   WBC, Wet Prep HPF POC MANY (A) NONE SEEN   Sperm NONE SEEN    Imaging:  No results found.  MAU Course: Orders Placed This Encounter  Procedures  . Wet prep, genital  . Urinalysis, Routine w reflex microscopic Urine, Clean Catch  . CBC  . Comprehensive metabolic panel  . Protein / creatinine ratio, urine  . Notify Physician  . Measure blood pressure  . Discharge patient   Meds ordered this encounter  Medications  . AND Linked Order Group   . labetalol (NORMODYNE) injection 20 mg   . labetalol (NORMODYNE) injection 40 mg   . labetalol (NORMODYNE) injection 80 mg   . hydrALAZINE (APRESOLINE) injection 10 mg  . lactated ringers bolus 1,000 mL  . ondansetron (ZOFRAN) injection 4 mg  . famotidine (PEPCID) IVPB 20 mg premix  . AND Linked Order Group   . alum & mag  hydroxide-simeth (MAALOX/MYLANTA) 200-200-20 MG/5ML suspension 30 mL   . lidocaine (XYLOCAINE) 2 % viscous mouth solution 15 mL  . dicyclomine (BENTYL) 10 MG/5ML solution 10 mg  . terconazole (TERAZOL 7) 0.4 % vaginal cream    Sig: Place 1 applicator vaginally at bedtime. Use for seven days    Dispense:  45 g    Refill:  0    Order Specific Question:   Supervising Provider    Answer:   Donnamae Jude [[id number]]  . promethazine (PHENERGAN) 12.5 MG tablet    Sig: Take 1 tablet (12.5 mg total) by mouth every 6 (six) hours as needed for nausea or vomiting.    Dispense:  30 tablet    Refill:  0    Order Specific Question:   Supervising Provider    Answer:   Merrily Pew   MDM: Labetalol protocol orders in - pt needed all 4 doses plus one of hydralazine before BP stabilized.  Preeclampsia labs normal  Nausea resolved with LR bolus, IV zofran and pepcid. Still had a little discomfort so gave GI cocktail, which completely resolved and pt able to tolerate po crackers and juice with vomiting or reflux symptoms.  Reviewed importance of taking BP meds (or reporting to MAU quickly if she is too sick to take) even if not feeling well, explained the great risks to her associated with BP as high as hers goes without medication. Pt expressed understanding. Reviewed eating habits for prevention of reflux.  Confirmed with patient that she has vaginal candida but not trichomonas.  Assessment: 1. Nausea and vomiting during pregnancy   2. Chronic hypertension during pregnancy   3. NST (non-stress test) reactive   4. Vaginal candida    Plan: Discharge home in stable condition with 2nd trimester and hypertensive precautions.     Follow-up [place] [facility]. Go to.   Specialty: Obstetrics and Gynecology Why: as scheduled for ongoing prenatal care Contact information: [place]  [phone]              Allergies as of 01/19/2021      Reactions   Amoxicillin Hives   Has patient had a PCN reaction causing immediate rash, facial/tongue/throat swelling, SOB or lightheadedness with hypotension: Yes Has patient had a PCN reaction causing severe rash involving mucus membranes or skin necrosis: No Has patient had  a PCN reaction that required hospitalization No Has patient had a PCN reaction occurring within the last 10 years: Yes If all of the above answers are "NO", then may proceed with Cephalosporin use.      Medication List    STOP taking these medications   metroNIDAZOLE 500 MG tablet Commonly known as: FLAGYL     TAKE these medications   AeroChamber Plus inhaler Use as instructed   albuterol 108 (90 Base) MCG/ACT inhaler Commonly known as: VENTOLIN HFA Inhale 2 puffs into the lungs every 6 (six) hours as needed for wheezing or shortness of breath.   amLODipine 5 MG tablet Commonly known as: NORVASC TAKE 1 TABLET (5 MG TOTAL) BY MOUTH DAILY.   aspirin EC 81 MG tablet Take 1 tablet (81 mg total) by mouth daily.   famotidine 20 MG tablet Commonly known as: Pepcid Take 1 tablet (20 mg total) by mouth 2 (two) times daily.   fluticasone 50 MCG/ACT nasal spray Commonly known as: FLONASE Place 1-2 sprays into both nostrils daily.   labetalol 200 MG tablet Commonly known as: NORMODYNE Take 1 tablet (200 mg total) by mouth 2 (two) times daily.   ondansetron 4 MG disintegrating tablet Commonly known as: Zofran ODT Take 1 tablet (4 mg total) by mouth every 8 (eight) hours as needed for nausea or vomiting.   PRENATAL PO Take by mouth.   promethazine 12.5 MG tablet Commonly known as: PHENERGAN Take 1 tablet (12.5 mg total) by mouth every 6 (six) hours as needed for nausea or vomiting.   terconazole 0.4 % vaginal cream Commonly known as: TERAZOL 7 Place 1 applicator vaginally at bedtime. Use for seven days      Gaylan Gerold,  CNM, MSN, [employer] Certified Nurse Midwife, [place]

## 2021-01-19 DIAGNOSIS — Z3A25 25 weeks gestation of pregnancy: Secondary | ICD-10-CM

## 2021-01-19 DIAGNOSIS — O10012 Pre-existing essential hypertension complicating pregnancy, second trimester: Secondary | ICD-10-CM

## 2021-01-19 DIAGNOSIS — O219 Vomiting of pregnancy, unspecified: Secondary | ICD-10-CM | POA: Diagnosis not present

## 2021-01-19 LAB — GC/CHLAMYDIA PROBE AMP (~~LOC~~) NOT AT ARMC
Chlamydia: NEGATIVE
Comment: NEGATIVE
Comment: NORMAL
Neisseria Gonorrhea: NEGATIVE

## 2021-01-19 MED ORDER — TERCONAZOLE 0.4 % VA CREA: 1.0000 | TOPICAL_CREAM | Freq: Every day | VAGINAL | 0 refills | Status: DC

## 2021-01-19 MED ORDER — PROMETHAZINE HCL 12.5 MG PO TABS: 12.5000 mg | ORAL_TABLET | Freq: Four times a day (QID) | ORAL | 0 refills | Status: DC | PRN

## 2021-01-19 NOTE — Discharge Instructions (Signed)
Hypertension During Pregnancy Hypertension is also called high blood pressure. High blood pressure means that the force of the blood moving in your body is high enough to cause problems for you and your baby. Different types of high blood pressure can happen during pregnancy. The types are:  High blood pressure before you got pregnant. This is called chronic hypertension.  This can continue during your pregnancy. Your doctor will want to keep checking your blood pressure. You may need medicine to control your blood pressure while you are pregnant. You will need follow-up visits after you have your baby.  High blood pressure that goes up during pregnancy when it was normal before. This is called gestational hypertension. It will often get better after you have your baby, but your doctor will need to watch your blood pressure to make sure that it is getting better.  You may develop high blood pressure after giving birth. This is called postpartum hypertension. This often occurs within 48 hours after childbirth but may occur up to 6 weeks after giving birth. Very high blood pressure during pregnancy is an emergency that needs treatment right away. How does this affect me? If you have high blood pressure during pregnancy, you have a higher chance of developing high blood pressure:  As you get older.  If you get pregnant again. In some cases, high blood pressure during pregnancy can cause:  Stroke.  Heart attack.  Damage to the kidneys, lungs, or liver.  Preeclampsia.  HELLP syndrome.  Seizures.  Problems with the placenta. How does this affect my baby? Your baby may:  Be born early.  Not weigh as much as he or she should.  Not handle labor well, leading to a C-section. This condition may also result in a baby's death before birth (stillbirth). What are the risks?  Having high blood pressure during a past pregnancy.  Being overweight.  Being age 35 or older.  Being pregnant  for the first time.  Being pregnant with more than one baby.  Becoming pregnant using fertility methods, such as IVF.  Having other problems, such as diabetes or kidney disease. What can I do to lower my risk?  Keep a healthy weight.  Eat a healthy diet.  Follow what your doctor tells you about treating any medical problems that you had before you got pregnant. It is very important to go to all of your doctor visits. Your doctor will check your blood pressure and make sure that your pregnancy is progressing as it should. Treatment should start early if a problem is found.   How is this treated? Treatment for high blood pressure during pregnancy can vary. It depends on the type of high blood pressure you have and how serious it is.  If you were taking medicine for your blood pressure before you got pregnant, talk with your doctor. You may need to change the medicine during pregnancy if it is not safe for your baby.  If your blood pressure goes up during pregnancy, your doctor may order medicine to treat this.  If you are at risk for preeclampsia, your doctor may tell you to take a low-dose aspirin while you are pregnant.  If you have very high blood pressure, you may need to stay in the hospital so you and your baby can be watched closely. You may also need to take medicine to lower your blood pressure.  In some cases, if your condition gets worse, you may need to have your baby early.   Follow these instructions at home: Eating and drinking  Drink enough fluid to keep your pee (urine) pale yellow.  Avoid caffeine.   Lifestyle  Do not smoke or use any products that contain nicotine or tobacco. If you need help quitting, ask your doctor.  Do not use alcohol or drugs.  Avoid stress.  Rest and get plenty of sleep.  Regular exercise can help. Ask your doctor what kinds of exercise are best for you. General instructions  Take over-the-counter and prescription medicines only as  told by your doctor.  Keep all prenatal and follow-up visits. Contact a doctor if:  You have symptoms that your doctor told you to watch for, such as: ? Headaches. ? A feeling like you may vomit (nausea). ? Vomiting. ? Belly (abdominal) pain. ? Feeling dizzy or light-headed. Get help right away if:  You have symptoms of serious problems, such as: ? Very bad belly pain that does not get better with treatment. ? A very bad headache that does not get better. ? Blurry vision. ? Double vision. ? Vomiting that does not get better. ? Sudden, fast weight gain. ? Sudden swelling in your hands, ankles, or face. ? Bleeding from your vagina. ? Blood in your pee. ? Shortness of breath. ? Chest pain. ? Weakness on one side of your body. ? Trouble talking.  Your baby is not moving as much as usual. These symptoms may be an emergency. Get help right away. Call your local emergency services (911 in the [place]  Do not wait to see if the symptoms will go away.  Do not drive yourself to the hospital. Summary  High blood pressure is also called hypertension.  High blood pressure means that the force of the blood moving in your body is high enough to cause problems for you and your baby.  Get help right away if you have symptoms of serious problems due to high blood pressure.  Keep all prenatal and follow-up visits. This information is not intended to replace advice given to you by your health care provider. Make sure you discuss any questions you have with your health care provider. Document Revised: 05/21/2020 Document Reviewed: 05/21/2020 Elsevier Patient Education  2021 [employer].   Morning Sickness  Morning sickness is when you feel like you may vomit (feel nauseous) during pregnancy. Sometimes, you may vomit. Morning sickness most often happens in the morning, but it can also happen at any time of the day. Some women may have morning sickness that makes them vomit all the time.  This is a more serious problem that needs treatment. What are the causes? The cause of this condition is not known. What increases the risk?  You had vomiting or a feeling like you may vomit before your pregnancy.  You had morning sickness in another pregnancy.  You are pregnant with more than one baby, such as twins. What are the signs or symptoms?  Feeling like you may vomit.  Vomiting. How is this treated? Treatment is usually not needed for this condition. You may only need to change what you eat. In some cases, your doctor may give you some things to take for your condition. These include:  Vitamin B6 supplements.  Medicines to treat the feeling that you may vomit.  Ginger. Follow these instructions at home: Medicines  Take over-the-counter and prescription medicines only as told by your doctor. Do not take any medicines until you talk with your doctor about them first.  Take multivitamins before  you get pregnant. These can stop or lessen the symptoms of morning sickness. Eating and drinking  Eat dry toast or crackers before getting out of bed.  Eat 5 or 6 small meals a day.  Eat dry and bland foods like rice and baked potatoes.  Do not eat greasy, fatty, or spicy foods.  Have someone cook for you if the smell of food causes you to vomit or to feel like you may vomit.  If you feel like you may vomit after taking prenatal vitamins, take them at night or with a snack.  Eat protein foods when you need a snack. Nuts, yogurt, and cheese are good choices.  Drink fluids throughout the day.  Try ginger ale made with real ginger, ginger tea made from fresh grated ginger, or ginger candies. General instructions  Do not smoke or use any products that contain nicotine or tobacco. If you need help quitting, ask your doctor.  Use an air purifier to keep the air in your house free of smells.  Get lots of fresh air.  Try to avoid smells that make you feel sick.  Try  wearing an acupressure wristband. This is a wristband that is used to treat seasickness.  Try a treatment called acupuncture. In this treatment, a doctor puts needles into certain areas of your body to make you feel better. Contact a doctor if:  You need medicine to feel better.  You feel dizzy or light-headed.  You are losing weight. Get help right away if:  The feeling that you may vomit will not go away, or you cannot stop vomiting.  You faint.  You have very bad pain in your belly. Summary  Morning sickness is when you feel like you may vomit (feel nauseous) during pregnancy.  You may feel sick in the morning, but you can feel this way at any time of the day.  Making some changes to what you eat may help your symptoms go away. This information is not intended to replace advice given to you by your health care provider. Make sure you discuss any questions you have with your health care provider. Document Revised: 04/13/2020 Document Reviewed: 03/23/2020 Elsevier Patient Education  2021 [employer].

## 2021-01-27 ENCOUNTER — Other Ambulatory Visit: Payer: Self-pay

## 2021-01-27 ENCOUNTER — Inpatient Hospital Stay (HOSPITAL_COMMUNITY)
Admission: AD | Admit: 2021-01-27 | Discharge: 2021-01-27 | Disposition: A | Discharge status: Home / Self Care | Payer: Medicaid Other | Attending: Obstetrics & Gynecology | Admitting: Obstetrics & Gynecology

## 2021-01-27 ENCOUNTER — Encounter (HOSPITAL_COMMUNITY): Payer: Self-pay | Admitting: Obstetrics & Gynecology

## 2021-01-27 DIAGNOSIS — O99322 Drug use complicating pregnancy, second trimester: Secondary | ICD-10-CM | POA: Diagnosis not present

## 2021-01-27 DIAGNOSIS — F17210 Nicotine dependence, cigarettes, uncomplicated: Secondary | ICD-10-CM | POA: Diagnosis not present

## 2021-01-27 DIAGNOSIS — O98312 Other infections with a predominantly sexual mode of transmission complicating pregnancy, second trimester: Secondary | ICD-10-CM

## 2021-01-27 DIAGNOSIS — O10912 Unspecified pre-existing hypertension complicating pregnancy, second trimester: Secondary | ICD-10-CM | POA: Diagnosis not present

## 2021-01-27 DIAGNOSIS — O99332 Smoking (tobacco) complicating pregnancy, second trimester: Secondary | ICD-10-CM | POA: Diagnosis not present

## 2021-01-27 DIAGNOSIS — R519 Headache, unspecified: Secondary | ICD-10-CM | POA: Insufficient documentation

## 2021-01-27 DIAGNOSIS — O99012 Anemia complicating pregnancy, second trimester: Secondary | ICD-10-CM | POA: Diagnosis not present

## 2021-01-27 DIAGNOSIS — O26892 Other specified pregnancy related conditions, second trimester: Secondary | ICD-10-CM | POA: Diagnosis present

## 2021-01-27 DIAGNOSIS — F1290 Cannabis use, unspecified, uncomplicated: Secondary | ICD-10-CM | POA: Diagnosis not present

## 2021-01-27 DIAGNOSIS — H538 Other visual disturbances: Secondary | ICD-10-CM | POA: Insufficient documentation

## 2021-01-27 DIAGNOSIS — Z8616 Personal history of COVID-19: Secondary | ICD-10-CM | POA: Insufficient documentation

## 2021-01-27 DIAGNOSIS — A599 Trichomoniasis, unspecified: Secondary | ICD-10-CM

## 2021-01-27 DIAGNOSIS — O10012 Pre-existing essential hypertension complicating pregnancy, second trimester: Secondary | ICD-10-CM | POA: Diagnosis not present

## 2021-01-27 DIAGNOSIS — O0932 Supervision of pregnancy with insufficient antenatal care, second trimester: Secondary | ICD-10-CM

## 2021-01-27 DIAGNOSIS — Z3A27 27 weeks gestation of pregnancy: Secondary | ICD-10-CM | POA: Insufficient documentation

## 2021-01-27 DIAGNOSIS — D649 Anemia, unspecified: Secondary | ICD-10-CM | POA: Insufficient documentation

## 2021-01-27 DIAGNOSIS — Z79899 Other long term (current) drug therapy: Secondary | ICD-10-CM | POA: Insufficient documentation

## 2021-01-27 DIAGNOSIS — O0990 Supervision of high risk pregnancy, unspecified, unspecified trimester: Secondary | ICD-10-CM

## 2021-01-27 DIAGNOSIS — O09522 Supervision of elderly multigravida, second trimester: Secondary | ICD-10-CM | POA: Insufficient documentation

## 2021-01-27 LAB — URINALYSIS, ROUTINE W REFLEX MICROSCOPIC
Bilirubin Urine: NEGATIVE
Glucose, UA: NEGATIVE mg/dL
Hgb urine dipstick: NEGATIVE
Ketones, ur: NEGATIVE mg/dL
Nitrite: NEGATIVE
Protein, ur: NEGATIVE mg/dL
Specific Gravity, Urine: 1.025 (ref 1.005–1.030)
pH: 6 (ref 5.0–8.0)

## 2021-01-27 LAB — URINALYSIS, MICROSCOPIC (REFLEX): RBC / HPF: NONE SEEN RBC/hpf (ref 0–5)

## 2021-01-27 LAB — CBC
HCT: 32.2 % — ABNORMAL LOW (ref 36.0–46.0)
Hemoglobin: 10.6 g/dL — ABNORMAL LOW (ref 12.0–15.0)
MCH: 31.4 pg (ref 26.0–34.0)
MCHC: 32.9 g/dL (ref 30.0–36.0)
MCV: 95.3 fL (ref 80.0–100.0)
Platelets: 202 10*3/uL (ref 150–400)
RBC: 3.38 MIL/uL — ABNORMAL LOW (ref 3.87–5.11)
RDW: 12.4 % (ref 11.5–15.5)
WBC: 7.4 10*3/uL (ref 4.0–10.5)
nRBC: 0 % (ref 0.0–0.2)

## 2021-01-27 LAB — COMPREHENSIVE METABOLIC PANEL
ALT: 13 U/L (ref 0–44)
AST: 14 U/L — ABNORMAL LOW (ref 15–41)
Albumin: 2.6 g/dL — ABNORMAL LOW (ref 3.5–5.0)
Alkaline Phosphatase: 92 U/L (ref 38–126)
Anion gap: 7 (ref 5–15)
BUN: 8 mg/dL (ref 6–20)
CO2: 22 mmol/L (ref 22–32)
Calcium: 8.3 mg/dL — ABNORMAL LOW (ref 8.9–10.3)
Chloride: 106 mmol/L (ref 98–111)
Creatinine, Ser: 0.76 mg/dL (ref 0.44–1.00)
GFR, Estimated: >60 mL/min (ref >60)
Glucose, Bld: 74 mg/dL (ref 70–99)
Potassium: 3.3 mmol/L — ABNORMAL LOW (ref 3.5–5.1)
Sodium: 135 mmol/L (ref 135–145)
Total Bilirubin: 0.3 mg/dL (ref 0.3–1.2)
Total Protein: 5.6 g/dL — ABNORMAL LOW (ref 6.5–8.1)

## 2021-01-27 LAB — PROTEIN / CREATININE RATIO, URINE
Creatinine, Urine: 264.07 mg/dL
Protein Creatinine Ratio: 0.08 mg/mg{Cre} (ref 0.00–0.15)
Total Protein, Urine: 21 mg/dL

## 2021-01-27 MED ORDER — LABETALOL HCL 5 MG/ML IV SOLN
80.0000 mg | INTRAVENOUS | Status: DC | PRN
  Administered 2021-01-27: 80 mg via INTRAVENOUS
  Filled 2021-01-27: qty 16

## 2021-01-27 MED ORDER — ACETAMINOPHEN 325 MG PO TABS: 650.0000 mg | ORAL_TABLET | ORAL | 0 refills | Status: DC | PRN

## 2021-01-27 MED ORDER — METRONIDAZOLE 500 MG PO TABS: 500.0000 mg | ORAL_TABLET | Freq: Two times a day (BID) | ORAL | 0 refills | Status: DC

## 2021-01-27 MED ORDER — LABETALOL HCL 5 MG/ML IV SOLN
20.0000 mg | INTRAVENOUS | Status: DC | PRN
  Administered 2021-01-27: 20 mg via INTRAVENOUS
  Filled 2021-01-27: qty 4

## 2021-01-27 MED ORDER — POLYSACCHARIDE IRON COMPLEX 150 MG PO CAPS: 150.0000 mg | ORAL_CAPSULE | ORAL | 0 refills | Status: DC

## 2021-01-27 MED ORDER — ACETAMINOPHEN 500 MG PO TABS
1000.0000 mg | ORAL_TABLET | Freq: Once | ORAL | Status: AC
  Administered 2021-01-27: 1000 mg via ORAL
  Filled 2021-01-27: qty 2

## 2021-01-27 MED ORDER — NIFEDIPINE ER OSMOTIC RELEASE 60 MG PO TB24: 60.0000 mg | ORAL_TABLET | Freq: Every day | ORAL | 3 refills | Status: DC

## 2021-01-27 MED ORDER — LABETALOL HCL 5 MG/ML IV SOLN
40.0000 mg | INTRAVENOUS | Status: DC | PRN
  Administered 2021-01-27: 40 mg via INTRAVENOUS
  Filled 2021-01-27: qty 8

## 2021-01-27 MED ORDER — HYDRALAZINE HCL 20 MG/ML IJ SOLN
10.0000 mg | INTRAMUSCULAR | Status: DC | PRN
  Administered 2021-01-27: 10 mg via INTRAVENOUS
  Filled 2021-01-27: qty 1

## 2021-01-27 NOTE — MAU Provider Note (Signed)
History     CSN: [id number]  Arrival date and time: 01/27/21 1003   Event Date/Time   First Provider Initiated Contact with Patient 01/27/21 1043      Chief Complaint  Patient presents with  . Headache  . Hypertension  . Blurred Vision   HPI  Marissa Meyer is a 40 y.o. [phone] at [redacted]w[redacted]d who presents to MAU with chief complaint of possible  blood pressure exacerbation in the setting Chronic Hypertension, currently managed with Labetalol 200 mg BID. Patient states she feels like her blood pressure is high. Her home blood pressure cuff started malfunctioning last night and she has not been able to confirm her blood pressure.   Headache This is a recurrent problem, current episode began last night. Patient endorses anterior headache with pain score + HA last night. Pain score is 6/10. She states her pain improved without intervention and she was able to fall asleep. Patient states she does not have an active prescription for Tylenol and could not afford to purchase it due to financial constraints so she has not taken medication for her headache.   Blurry vision This is a recurrent problem, onset in first trimester. Patient states it has been "going on for awhile now". She denies spots in her field of vision. She also denies RUQ/epigastric pain, new onset swelling or weight gain.  She denies vaginal bleeding, leaking of fluid, decreased fetal movement, fever, falls, or recent illness.    OB History    Gravida  6   Para  4   Term  0   Preterm  4   AB  1   Living  4     SAB      IAB      Ectopic  1   Multiple  0   Live Births  4           Past Medical History:  Diagnosis Date  . Anemia   . Chlamydia   . COVID-19   . Ectopic pregnancy   . Gonorrhea   . Hypertension   . Mental disorder   . Migraines    but not diagnosed  . Pregnancy induced hypertension   . Preterm delivery   . Trichomonas     Past Surgical History:  Procedure Laterality Date  .  CESAREAN SECTION N/A 03/21/2014   Procedure: CESAREAN SECTION;  Surgeon: Osborne Oman, MD;  Location: [facility];  Service: Obstetrics;  Laterality: N/A;  . DILATION AND CURETTAGE OF UTERUS    . ECTOPIC PREGNANCY SURGERY      Family History  Problem Relation Age of Onset  . Hypertension Mother   . Hypertension Maternal Grandmother   . Cancer Maternal Grandfather   . Hypertension Father     Social History   Tobacco Use  . Smoking status: Current Every Day Smoker    Packs/day: 0.25    Types: Cigars    Last attempt to quit: 08/13/2013    Years since quitting: 7.4  . Smokeless tobacco: Never Used  . Tobacco comment: black and milds; 2-3 per day  Vaping Use  . Vaping Use: Never used  Substance Use Topics  . Alcohol use: No  . Drug use: Yes    Frequency: 1.0 times per week    Types: Marijuana    Allergies:  Allergies  Allergen Reactions  . Amoxicillin Hives    Has patient had a PCN reaction causing immediate rash, facial/tongue/throat swelling, SOB or lightheadedness with hypotension: Yes Has  patient had a PCN reaction causing severe rash involving mucus membranes or skin necrosis: No Has patient had a PCN reaction that required hospitalization No Has patient had a PCN reaction occurring within the last 10 years: Yes If all of the above answers are "NO", then may proceed with Cephalosporin use.    Medications Prior to Admission  Medication Sig Dispense Refill Last Dose  . albuterol (VENTOLIN HFA) 108 (90 Base) MCG/ACT inhaler Inhale 2 puffs into the lungs every 6 (six) hours as needed for wheezing or shortness of breath. 18 g 0 01/26/2021 at 2200  . labetalol (NORMODYNE) 200 MG tablet Take 1 tablet (200 mg total) by mouth 2 (two) times daily. 60 tablet 0 01/27/2021 at 0715  . Prenatal Vit-Fe Fumarate-FA (PRENATAL PO) Take by mouth.   01/27/2021 at [place]  . amLODipine (NORVASC) 5 MG tablet TAKE 1 TABLET (5 MG TOTAL) BY MOUTH DAILY. (Patient not taking: Reported on 01/11/2021) 90  tablet 0   . aspirin EC 81 MG tablet Take 1 tablet (81 mg total) by mouth daily. (Patient not taking: Reported on 01/11/2021) 60 tablet 2   . famotidine (PEPCID) 20 MG tablet Take 1 tablet (20 mg total) by mouth 2 (two) times daily. (Patient not taking: Reported on 12/21/2020) 60 tablet 4   . fluticasone (FLONASE) 50 MCG/ACT nasal spray Place 1-2 sprays into both nostrils daily. (Patient not taking: Reported on 12/14/2020) 16 g 3   . ondansetron (ZOFRAN ODT) 4 MG disintegrating tablet Take 1 tablet (4 mg total) by mouth every 8 (eight) hours as needed for nausea or vomiting. (Patient not taking: Reported on 12/14/2020) 20 tablet 0   . promethazine (PHENERGAN) 12.5 MG tablet Take 1 tablet (12.5 mg total) by mouth every 6 (six) hours as needed for nausea or vomiting. 30 tablet 0   . Spacer/Aero-Holding Chambers (AEROCHAMBER PLUS) inhaler Use as instructed (Patient not taking: Reported on 09/21/2020) 1 each 2   . terconazole (TERAZOL 7) 0.4 % vaginal cream Place 1 applicator vaginally at bedtime. Use for seven days 45 g 0     Review of Systems  Constitutional: Positive for fatigue.  Eyes: Negative for visual disturbance.  Neurological: Positive for headaches.  All other systems reviewed and are negative.  Physical Exam   Blood pressure (!) 178/104, pulse 90, temperature 98.3 F (36.8 C), temperature source Oral, resp. rate 20, last menstrual period 08/02/2020, SpO2 100 %.  Physical Exam Vitals and nursing note reviewed.  Constitutional:      Appearance: She is well-developed. She is ill-appearing.  Cardiovascular:     Heart sounds: Normal heart sounds.  Pulmonary:     Effort: Pulmonary effort is normal.     Breath sounds: Normal breath sounds.  Abdominal:     Comments: Gravid  Skin:    General: Skin is warm and dry.     Capillary Refill: Capillary refill takes less than 2 seconds.  Neurological:     Mental Status: She is alert and oriented to person, place, and time.  Psychiatric:         Mood and Affect: Mood normal.        Speech: Speech normal.        Behavior: Behavior normal.     MAU Course  Procedures   --Fetal tracing appropriate for gestational age of [redacted]w[redacted]d : baseline 140, mod var, + 10 x 10  CHTN --At bedside upon rooming due to severe range BP --Labetalol Protocol initiated. RN at bedside attempting IV access --  Patient sleeping one hour after PO Tylenol --Severe range pressure resolved with final Hydralazine from Labetalol Protocol. Consistent with previous MAU visit 01/18/2021 --Patient woke up when CNM entered room to discuss discharge and new medication regimen. Patient acknowledges she was asleep, reports pain score 4/10. Discussed with patient that ability to sleep soundly for prolonged period of time is reassuring for resolution of pain.  Trichomonas --Trichomonas in urine. Hx Trichomonas. Will send treatment to pharmacy. Emphasized importance of timely partner treatment, abstinence until both partners are 7 days past treatment, condom use for remainder of pregnancy to reduce risk of reinfection  Anemia --Hgb 10.6, asymptomatic --Will initiate   Insufficient Prenatal Care --Patient not seen in office since 12/21/2020. Attended MFM appointment 01/11/2021. Verbalizes she had transportation issues which are now resolved. Patient to restart appointment schedule when she presents for BP check this week  Patient Vitals for the past 24 hrs:  BP Temp Temp src Pulse Resp SpO2  01/27/21 1300 126/75 98 F (36.7 C) Oral 87 18 99 %  01/27/21 1245 133/80 -- -- 89 -- 99 %  01/27/21 1230 (!) 145/83 -- -- 91 -- 99 %  01/27/21 1215 (!) 141/87 -- -- 91 -- 100 %  01/27/21 1205 -- -- -- -- -- 100 %  01/27/21 1201 140/89 -- -- 87 -- --  01/27/21 1200 -- -- -- -- -- 99 %  01/27/21 1146 (!) 162/98 -- -- 87 -- --  01/27/21 1130 (!) 173/104 -- -- 85 -- 100 %  01/27/21 1115 (!) 182/100 -- -- 82 20 100 %  01/27/21 1050 -- -- -- -- -- 100 %  01/27/21 1046 (!) 178/104 --  -- 90 -- --  01/27/21 1039 (!) 184/93 -- -- 83 20 100 %  01/27/21 1023 (!) 159/83 98.3 F (36.8 C) Oral 85 16 100 %   Results for orders placed or performed during the hospital encounter of 01/27/21 (from the past 24 hour(s))  Protein / creatinine ratio, urine     Status: None   Collection Time: 01/27/21 10:33 AM  Result Value Ref Range   Creatinine, Urine 264.07 mg/dL   Total Protein, Urine 21 mg/dL   Protein Creatinine Ratio 0.08 0.00 - 0.15 mg/mg[Cre]  Urinalysis, Routine w reflex microscopic     Status: Abnormal   Collection Time: 01/27/21 10:33 AM  Result Value Ref Range   Color, Urine YELLOW YELLOW   APPearance CLEAR CLEAR   Specific Gravity, Urine 1.025 1.005 - 1.030   pH 6.0 5.0 - 8.0   Glucose, UA NEGATIVE NEGATIVE mg/dL   Hgb urine dipstick NEGATIVE NEGATIVE   Bilirubin Urine NEGATIVE NEGATIVE   Ketones, ur NEGATIVE NEGATIVE mg/dL   Protein, ur NEGATIVE NEGATIVE mg/dL   Nitrite NEGATIVE NEGATIVE   Leukocytes,Ua SMALL (A) NEGATIVE  Urinalysis, Microscopic (reflex)     Status: Abnormal   Collection Time: 01/27/21 10:33 AM  Result Value Ref Range   RBC / HPF NONE SEEN 0 - 5 RBC/hpf   WBC, UA 6-10 0 - 5 WBC/hpf   Bacteria, UA FEW (A) NONE SEEN   Squamous Epithelial / LPF 0-5 0 - 5   Trichomonas, UA PRESENT (A) NONE SEEN  CBC     Status: Abnormal   Collection Time: 01/27/21 11:03 AM  Result Value Ref Range   WBC 7.4 4.0 - 10.5 K/uL   RBC 3.38 (L) 3.87 - 5.11 MIL/uL   Hemoglobin 10.6 (L) 12.0 - 15.0 g/dL   HCT 32.2 (L) 36.0 - 46.0 %  MCV 95.3 80.0 - 100.0 fL   MCH 31.4 26.0 - 34.0 pg   MCHC 32.9 30.0 - 36.0 g/dL   RDW 12.4 11.5 - 15.5 %   Platelets 202 150 - 400 K/uL   nRBC 0.0 0.0 - 0.2 %  Comprehensive metabolic panel     Status: Abnormal   Collection Time: 01/27/21 11:03 AM  Result Value Ref Range   Sodium 135 135 - 145 mmol/L   Potassium 3.3 (L) 3.5 - 5.1 mmol/L   Chloride 106 98 - 111 mmol/L   CO2 22 22 - 32 mmol/L   Glucose, Bld 74 70 - 99 mg/dL   BUN  8 6 - 20 mg/dL   Creatinine, Ser 0.76 0.44 - 1.00 mg/dL   Calcium 8.3 (L) 8.9 - 10.3 mg/dL   Total Protein 5.6 (L) 6.5 - 8.1 g/dL   Albumin 2.6 (L) 3.5 - 5.0 g/dL   AST 14 (L) 15 - 41 U/L   ALT 13 0 - 44 U/L   Alkaline Phosphatase 92 38 - 126 U/L   Total Bilirubin 0.3 0.3 - 1.2 mg/dL   GFR, Estimated >60 >60 mL/min   Anion gap 7 5 - 15   Meds ordered this encounter  Medications  . AND Linked Order Group   . labetalol (NORMODYNE) injection 20 mg   . labetalol (NORMODYNE) injection 40 mg   . labetalol (NORMODYNE) injection 80 mg   . hydrALAZINE (APRESOLINE) injection 10 mg  . acetaminophen (TYLENOL) tablet 1,000 mg  . NIFEdipine (PROCARDIA XL) 60 MG 24 hr tablet    Sig: Take 1 tablet (60 mg total) by mouth daily.    Dispense:  30 tablet    Refill:  3    Order Specific Question:   Supervising Provider    Answer:   Janyth Pupa [[id number]]  . acetaminophen (TYLENOL) 325 MG tablet    Sig: Take 2 tablets (650 mg total) by mouth every 4 (four) hours as needed for headache.    Dispense:  180 tablet    Refill:  0    Order Specific Question:   Supervising Provider    Answer:   Janyth Pupa [[id number]]  . iron polysaccharides (NIFEREX) 150 MG capsule    Sig: Take 1 capsule (150 mg total) by mouth every other day.    Dispense:  15 capsule    Refill:  0    Order Specific Question:   Supervising Provider    Answer:   Janyth Pupa [[id number]]  . metroNIDAZOLE (FLAGYL) 500 MG tablet    Sig: Take 1 tablet (500 mg total) by mouth 2 (two) times daily.    Dispense:  14 tablet    Refill:  0    Order Specific Question:   Supervising Provider    Answer:   Janyth Pupa [[id number]]    Assessment and Plan  --40 y.o. [id number] at [redacted]w[redacted]d  --CHTN, Preeclampsia labs WNL --Per Dr. Nelda Marseille, final Labetalol tonight then discontinue, start Procardia 60XL tomorrow morning --Fetal surveillance appropriate for Gestational Age --New prescription for Tylenol to avoid out of pocket cost --Asymptomatic anemia  (Hgb 10.6) , initiate PO Fe every other day --Trichomonas positive, treatment sent to pharmacy --Discharge home in stable condition with Preeclampsia precautions  F/U: --Appt made for BP check Friday 01/29/2021 at [facility], [place] 01/27/2021, 5:17 PM

## 2021-01-27 NOTE — MAU Note (Signed)
Pt has CHTN, is taking meds. Tried to check BP, would pump up- but not give reading ? Needs new batteries.  Headache started last night- did not take anything- doesn't have any Tylenol, vision blurred this morning- seeing spots. Denies RUQ pain.  Reports increased swelling in lower extremities.

## 2021-01-29 ENCOUNTER — Ambulatory Visit: Payer: Medicaid Other

## 2021-02-15 ENCOUNTER — Other Ambulatory Visit: Payer: Self-pay

## 2021-02-15 ENCOUNTER — Inpatient Hospital Stay (HOSPITAL_COMMUNITY)
Admission: AD | Admit: 2021-02-15 | Discharge: 2021-02-16 | Disposition: A | Discharge status: Home / Self Care | Payer: Medicaid Other | Attending: Family Medicine | Admitting: Family Medicine

## 2021-02-15 ENCOUNTER — Other Ambulatory Visit: Payer: Self-pay | Admitting: Obstetrics

## 2021-02-15 ENCOUNTER — Ambulatory Visit: Payer: Medicaid Other | Attending: Obstetrics | Admitting: Obstetrics

## 2021-02-15 ENCOUNTER — Encounter: Payer: Self-pay | Admitting: *Deleted

## 2021-02-15 ENCOUNTER — Ambulatory Visit: Payer: Medicaid Other | Attending: Obstetrics and Gynecology

## 2021-02-15 ENCOUNTER — Ambulatory Visit: Payer: Medicaid Other | Admitting: *Deleted

## 2021-02-15 ENCOUNTER — Encounter (HOSPITAL_COMMUNITY): Payer: Self-pay | Admitting: Family Medicine

## 2021-02-15 ENCOUNTER — Other Ambulatory Visit: Payer: Self-pay | Admitting: Obstetrics and Gynecology

## 2021-02-15 DIAGNOSIS — O10913 Unspecified pre-existing hypertension complicating pregnancy, third trimester: Secondary | ICD-10-CM

## 2021-02-15 DIAGNOSIS — O10919 Unspecified pre-existing hypertension complicating pregnancy, unspecified trimester: Secondary | ICD-10-CM | POA: Insufficient documentation

## 2021-02-15 DIAGNOSIS — O99613 Diseases of the digestive system complicating pregnancy, third trimester: Secondary | ICD-10-CM

## 2021-02-15 DIAGNOSIS — O0990 Supervision of high risk pregnancy, unspecified, unspecified trimester: Secondary | ICD-10-CM

## 2021-02-15 DIAGNOSIS — K219 Gastro-esophageal reflux disease without esophagitis: Secondary | ICD-10-CM

## 2021-02-15 DIAGNOSIS — O09523 Supervision of elderly multigravida, third trimester: Secondary | ICD-10-CM

## 2021-02-15 DIAGNOSIS — O34219 Maternal care for unspecified type scar from previous cesarean delivery: Secondary | ICD-10-CM | POA: Diagnosis not present

## 2021-02-15 DIAGNOSIS — Z3A29 29 weeks gestation of pregnancy: Secondary | ICD-10-CM

## 2021-02-15 DIAGNOSIS — F17210 Nicotine dependence, cigarettes, uncomplicated: Secondary | ICD-10-CM | POA: Diagnosis not present

## 2021-02-15 DIAGNOSIS — O99333 Smoking (tobacco) complicating pregnancy, third trimester: Secondary | ICD-10-CM | POA: Diagnosis not present

## 2021-02-15 DIAGNOSIS — O10013 Pre-existing essential hypertension complicating pregnancy, third trimester: Secondary | ICD-10-CM

## 2021-02-15 DIAGNOSIS — O09213 Supervision of pregnancy with history of pre-term labor, third trimester: Secondary | ICD-10-CM | POA: Diagnosis not present

## 2021-02-15 DIAGNOSIS — O365930 Maternal care for other known or suspected poor fetal growth, third trimester, not applicable or unspecified: Secondary | ICD-10-CM

## 2021-02-15 DIAGNOSIS — Z8616 Personal history of COVID-19: Secondary | ICD-10-CM | POA: Insufficient documentation

## 2021-02-15 DIAGNOSIS — F1910 Other psychoactive substance abuse, uncomplicated: Secondary | ICD-10-CM

## 2021-02-15 DIAGNOSIS — Z8751 Personal history of pre-term labor: Secondary | ICD-10-CM

## 2021-02-15 DIAGNOSIS — Z3689 Encounter for other specified antenatal screening: Secondary | ICD-10-CM

## 2021-02-15 DIAGNOSIS — O99323 Drug use complicating pregnancy, third trimester: Secondary | ICD-10-CM

## 2021-02-15 LAB — CBC
HCT: 36 % (ref 36.0–46.0)
Hemoglobin: 11.8 g/dL — ABNORMAL LOW (ref 12.0–15.0)
MCH: 30.6 pg (ref 26.0–34.0)
MCHC: 32.8 g/dL (ref 30.0–36.0)
MCV: 93.3 fL (ref 80.0–100.0)
Platelets: 204 10*3/uL (ref 150–400)
RBC: 3.86 MIL/uL — ABNORMAL LOW (ref 3.87–5.11)
RDW: 12.9 % (ref 11.5–15.5)
WBC: 7.6 10*3/uL (ref 4.0–10.5)
nRBC: 0 % (ref 0.0–0.2)

## 2021-02-15 LAB — COMPREHENSIVE METABOLIC PANEL
ALT: 25 U/L (ref 0–44)
AST: 33 U/L (ref 15–41)
Albumin: 2.9 g/dL — ABNORMAL LOW (ref 3.5–5.0)
Alkaline Phosphatase: 144 U/L — ABNORMAL HIGH (ref 38–126)
Anion gap: 6 (ref 5–15)
BUN: 10 mg/dL (ref 6–20)
CO2: 22 mmol/L (ref 22–32)
Calcium: 8.7 mg/dL — ABNORMAL LOW (ref 8.9–10.3)
Chloride: 106 mmol/L (ref 98–111)
Creatinine, Ser: 0.81 mg/dL (ref 0.44–1.00)
GFR, Estimated: >60 mL/min (ref >60)
Glucose, Bld: 79 mg/dL (ref 70–99)
Potassium: 3.2 mmol/L — ABNORMAL LOW (ref 3.5–5.1)
Sodium: 134 mmol/L — ABNORMAL LOW (ref 135–145)
Total Bilirubin: 0.4 mg/dL (ref 0.3–1.2)
Total Protein: 6.3 g/dL — ABNORMAL LOW (ref 6.5–8.1)

## 2021-02-15 LAB — PROTEIN / CREATININE RATIO, URINE
Creatinine, Urine: 33.46 mg/dL
Total Protein, Urine: <6 mg/dL

## 2021-02-15 MED ORDER — LABETALOL HCL 5 MG/ML IV SOLN
20.0000 mg | INTRAVENOUS | Status: DC | PRN
  Administered 2021-02-15: 20 mg via INTRAVENOUS
  Filled 2021-02-15: qty 4

## 2021-02-15 MED ORDER — LABETALOL HCL 5 MG/ML IV SOLN
80.0000 mg | INTRAVENOUS | Status: DC | PRN
  Administered 2021-02-15: 80 mg via INTRAVENOUS
  Filled 2021-02-15: qty 16

## 2021-02-15 MED ORDER — HYDRALAZINE HCL 20 MG/ML IJ SOLN: 10.0000 mg | INTRAMUSCULAR | Status: DC | PRN

## 2021-02-15 MED ORDER — FAMOTIDINE IN NACL 20-0.9 MG/50ML-% IV SOLN
20.0000 mg | Freq: Once | INTRAVENOUS | Status: AC
  Administered 2021-02-15: 20 mg via INTRAVENOUS
  Filled 2021-02-15: qty 50

## 2021-02-15 MED ORDER — LABETALOL HCL 5 MG/ML IV SOLN
40.0000 mg | INTRAVENOUS | Status: DC | PRN
  Administered 2021-02-15: 40 mg via INTRAVENOUS
  Filled 2021-02-15: qty 8

## 2021-02-15 MED ORDER — ACETAMINOPHEN 10 MG/ML IV SOLN
1000.0000 mg | Freq: Once | INTRAVENOUS | Status: AC
  Administered 2021-02-15: 1000 mg via INTRAVENOUS
  Filled 2021-02-15: qty 100

## 2021-02-15 MED ORDER — LACTATED RINGERS IV SOLN: INTRAVENOUS | Status: DC

## 2021-02-15 MED ORDER — ONDANSETRON HCL 4 MG/2ML IJ SOLN
4.0000 mg | Freq: Once | INTRAMUSCULAR | Status: AC
  Administered 2021-02-15: 4 mg via INTRAVENOUS
  Filled 2021-02-15: qty 2

## 2021-02-15 MED ORDER — OMEPRAZOLE 20 MG PO CPDR: 20.0000 mg | DELAYED_RELEASE_CAPSULE | Freq: Two times a day (BID) | ORAL | 3 refills | Status: DC

## 2021-02-15 NOTE — MAU Note (Addendum)
PT SAYS HAD U/S AT 1045- HAD H/A AND BP WAS UP. TOLD TO COME HERE-  HAS NOT TAKEN ANY MEDS FOR H/A- TAKES BP MEDS EVERY AM- BUT TODAY DID NOT TAKE UNTIL NOON

## 2021-02-15 NOTE — Discharge Instructions (Signed)
It is very important that you take your BP meds - try the new Prilosec first thing in the morning and at bedtime. Also take your Procardia with a small meal or a protein shake. If the Prilosec dose does not work, call into the office so we can try you on something else.

## 2021-02-15 NOTE — Progress Notes (Signed)
MFM Note  This patient was seen for a follow up growth scan due to advanced maternal age and chronic hypertension that is treated with Procardia.    The patient was evaluated in the MAU about two weeks ago due to elevated blood pressures.  She ruled out for preeclampsia at that time and was switched from labetalol to Procardia for treatment of her elevated blood pressures.    The patient's blood pressures in our office this morning were 187/103 and 176/104.  She reports that she has had a headache and blurry vision for a a while now. She did not take her antihypertensive medication today.  She has a history of preeclampsia and IUGR in all of her prior pregnancies.  She reports that she has given birth to children weighing about 1 pound 8 ounces to about 3 pound 15 ounces and 3 pounds 16 ounces at around 36 weeks.    On today's exam, the EFW measures at the <1st percentile for her gestational age indicating fetal growth restriction.  There was normal amniotic fluid noted.    Fetal movements were noted throughout today's ultrasound exam.  Doppler studies of the umbilical arteries showed an elevated S/D ratio of 4.31.  There were no signs of absent or reversed end-diastolic flow.    Due to her extremely elevated blood pressures and complaints of headache and blurry vision, the patient was advised to go to the MAU following today's ultrasound exam for evaluation of preeclampsia and for blood pressure control.  In the MAU, she should have [place] labs drawn and have a P/C ratio performed to evaluate for significant proteinuria.  Should her blood pressures remain extremely elevated or if she is diagnosed with preeclampsia, admission to the hospital and the administration of a complete course of antenatal corticosteroids may be necessary.  Should she be ruled out for preeclampsia and her blood pressures are controlled after antihypertensive medications are given, due to severe fetal growth restriction, we  will continue to follow her with weekly biophysical profiles and umbilical artery Doppler studies.  The increased risk of an adverse pregnancy outcome such as placental abruption, indicated preterm birth, and stillbirth due to uncontrolled blood pressures was discussed today.  The patient stated that she needed to go get her paycheck from her job first before going to the MAU.  She was advised to go to the MAU once she gets her paycheck.  A total of 15 minutes was spent counseling and coordinating the care for this patient.  Greater than 50% of the time was spent in direct face-to-face contact.

## 2021-02-15 NOTE — MAU Provider Note (Signed)
Chief Complaint:  Headache   Event Date/Time   First Provider Initiated Contact with Patient 02/15/21 2125     HPI: Marissa Meyer is a 40 y.o. [id number] at [redacted]w[redacted]d who presents to maternity admissions reporting severe reflux with nausea that has prevented her from taking/keeping down her BP meds. Says they make her nauseated whenever she takes the "new one" (Procardia). Today she took her procardia at lunchtime but threw it up. Is not taking anything for the reflux, has tried pepcid in the past but it did not work (the reflux is not new with pregnancy, it is a chronic issue she can usually treat with Tums). Since she has not been able to take her BP meds, she's developed a severe headache, stating "it feels like my head is going to explode" but denies visual disturbances. Also denies vaginal bleeding, leaking of fluid, decreased fetal movement, fever, falls, or recent illness.   Pregnancy Course: Receives care at [facility] but has not been seen there since 12/21/20 due to childcare and transportation issues. Prenatal records fully reviewed.  Past Medical History:  Diagnosis Date  . Anemia   . Chlamydia   . COVID-19   . Ectopic pregnancy   . Gonorrhea   . Hypertension   . Mental disorder   . Migraines    but not diagnosed  . Pregnancy induced hypertension   . Preterm delivery   . Trichomonas    OB History  Gravida Para Term Preterm AB Living  6 4 0 4 1 4   SAB IAB Ectopic Multiple Live Births      1 0 4    # Outcome Date GA Lbr Len/2nd Weight Sex Delivery Anes PTL Lv  6 Current           5 Preterm 09/19/15 [redacted]w[redacted]d  2 lb 14.4 oz (1.315 kg) M Vag-Spont None  LIV  4 Preterm 03/21/14 [redacted]w[redacted]d  3 lb 14.4 oz (1.769 kg) M CS-LTranv Spinal  LIV  3 Preterm 10/31/09    M Vag-Spont   LIV  2 Ectopic           1 Preterm     F Vag-Spont   LIV   Past Surgical History:  Procedure Laterality Date  . CESAREAN SECTION N/A 03/21/2014   Procedure: CESAREAN SECTION;  Surgeon: Osborne Oman, MD;  Location: [place]  [facility];  Service: Obstetrics;  Laterality: N/A;  . DILATION AND CURETTAGE OF UTERUS    . ECTOPIC PREGNANCY SURGERY     Family History  Problem Relation Age of Onset  . Hypertension Mother   . Hypertension Maternal Grandmother   . Cancer Maternal Grandfather   . Hypertension Father    Social History   Tobacco Use  . Smoking status: Current Every Day Smoker    Packs/day: 0.25    Types: Cigars    Last attempt to quit: 08/13/2013    Years since quitting: 7.5  . Smokeless tobacco: Never Used  . Tobacco comment: black and milds; 2-3 per day  Vaping Use  . Vaping Use: Never used  Substance Use Topics  . Alcohol use: No  . Drug use: Not Currently    Frequency: 1.0 times per week    Types: Marijuana   Allergies  Allergen Reactions  . Amoxicillin Hives    Has patient had a PCN reaction causing immediate rash, facial/tongue/throat swelling, SOB or lightheadedness with hypotension: Yes Has patient had a PCN reaction causing severe rash involving mucus membranes or skin necrosis: No Has  patient had a PCN reaction that required hospitalization No Has patient had a PCN reaction occurring within the last 10 years: Yes If all of the above answers are "NO", then may proceed with Cephalosporin use.   No medications prior to admission.    I have reviewed patient's Past Medical Hx, Surgical Hx, Family Hx, Social Hx, medications and allergies.   ROS:  Review of Systems  Constitutional: Negative for fatigue and fever.  HENT: Positive for ear pain (with headache).   Eyes: Positive for pain (with headache). Negative for visual disturbance.  Respiratory: Negative for shortness of breath.   Cardiovascular: Negative for chest pain.  Gastrointestinal: Positive for nausea and vomiting.  Genitourinary: Negative for flank pain and pelvic pain.  Neurological: Positive for headaches. Negative for syncope and light-headedness.   Physical Exam   Patient Vitals for the past 24 hrs:  BP Temp Temp src  Pulse Resp SpO2 Height Weight  02/15/21 2345 (!) 146/81 98.3 F (36.8 C) Oral 89 16 100 % -- --  02/15/21 2330 132/80 -- -- 86 -- -- -- --  02/15/21 2316 137/89 -- -- 84 -- -- -- --  02/15/21 2301 (!) 147/96 -- -- 91 -- -- -- --  02/15/21 2246 (!) 145/86 -- -- 86 -- -- -- --  02/15/21 2230 (!) 147/81 -- -- 86 -- -- -- --  02/15/21 2215 (!) 154/94 -- -- 95 -- -- -- --  02/15/21 2200 (!) 168/99 -- -- 96 -- -- -- --  02/15/21 2150 -- -- -- -- -- 99 % -- --  02/15/21 2145 -- -- -- -- -- 99 % -- --  02/15/21 2140 (!) 162/103 -- -- 92 -- 99 % -- --  02/15/21 2110 (!) 186/108 -- -- (!) 105 -- 100 % -- --  02/15/21 2044 (!) 188/98 98.4 F (36.9 C) Oral (!) 103 20 -- 4\' 9"  (1.448 m) 123 lb 8 oz (56 kg)   Constitutional: Well-developed, well-nourished female in mild distress.  Cardiovascular: normal rate & rhythm, no murmur Respiratory: normal effort, lung sounds clear throughout GI: Abd soft, non-tender, gravid appropriate for gestational age. Pos BS x 4 MS: Extremities nontender, no edema, normal ROM Neurologic: Alert and oriented x 4.  GU: no CVA tenderness Pelvic exam deferred  Fetal Tracing: reactive Baseline: 140 Variability: moderate Accelerations: 15x15 Decelerations: none Toco: relaxed   Labs: Results for orders placed or performed during the hospital encounter of 02/15/21 (from the past 24 hour(s))  Protein / creatinine ratio, urine     Status: None   Collection Time: 02/15/21  9:11 PM  Result Value Ref Range   Creatinine, Urine 33.46 mg/dL   Total Protein, Urine <6 mg/dL   Protein Creatinine Ratio        0.00 - 0.15 mg/mg[Cre]  Comprehensive metabolic panel     Status: Abnormal   Collection Time: 02/15/21  9:35 PM  Result Value Ref Range   Sodium 134 (L) 135 - 145 mmol/L   Potassium 3.2 (L) 3.5 - 5.1 mmol/L   Chloride 106 98 - 111 mmol/L   CO2 22 22 - 32 mmol/L   Glucose, Bld 79 70 - 99 mg/dL   BUN 10 6 - 20 mg/dL   Creatinine, Ser 0.81 0.44 - 1.00 mg/dL   Calcium  8.7 (L) 8.9 - 10.3 mg/dL   Total Protein 6.3 (L) 6.5 - 8.1 g/dL   Albumin 2.9 (L) 3.5 - 5.0 g/dL   AST 33 15 - 41 U/L  ALT 25 0 - 44 U/L   Alkaline Phosphatase 144 (H) 38 - 126 U/L   Total Bilirubin 0.4 0.3 - 1.2 mg/dL   GFR, Estimated >60 >60 mL/min   Anion gap 6 5 - 15  CBC     Status: Abnormal   Collection Time: 02/15/21  9:35 PM  Result Value Ref Range   WBC 7.6 4.0 - 10.5 K/uL   RBC 3.86 (L) 3.87 - 5.11 MIL/uL   Hemoglobin 11.8 (L) 12.0 - 15.0 g/dL   HCT 36.0 36.0 - 46.0 %   MCV 93.3 80.0 - 100.0 fL   MCH 30.6 26.0 - 34.0 pg   MCHC 32.8 30.0 - 36.0 g/dL   RDW 12.9 11.5 - 15.5 %   Platelets 204 150 - 400 K/uL   nRBC 0.0 0.0 - 0.2 %    Imaging:  [place] MFM OB FOLLOW UP  Result Date: 02/15/2021 ----------------------------------------------------------------------  OBSTETRICS REPORT                       (Signed Final 02/15/2021 01:30 pm) ---------------------------------------------------------------------- Patient Info  ID #:       [id number]                          D.O.B.:  Jun 29, 1981 (39 yrs)  Name:       Marissa Meyer                  Visit Date: 02/15/2021 10:39 am ---------------------------------------------------------------------- Performed By  Attending:        Johnell Comings MD         Ref. Address:     [address]                                                             [place], [place]  Performed By:     Germain Osgood            [facility]                                                             [place] [facility]  Referred By:      [facility] ---------------------------------------------------------------------- Orders  #  Description                           Code        Ordered By  1  [place] MFM OB FOLLOW UP                    [phone]    Tama High  2  [place] MFM UA CORD DOPPLER                [id number] ----------------------------------------------------------------------  #  Order #                     Accession #                Episode #  1  [id number]                   [id number]                 [id number]  2  [id number]                   [id number]                 [id number] ---------------------------------------------------------------------- Indications  Hypertension - Chronic/Pre-existing -          O10.019  procardia  Advanced maternal age multigravida 30+,        O81.523  third trimester (39)  History of cesarean delivery x2, currently     O34.219  pregnant  History of pre-term deliveries (34, 35 weeks)  O09.219  Substance abuse affecting pregnancy,           O99.320 F19.10  antepartum (THC)  Tobacco use complicating pregnancy, third      O99.333  trimester  [redacted] weeks gestation of pregnancy                Z3A.29 ---------------------------------------------------------------------- Fetal Evaluation  Num Of Fetuses:         1  Fetal Heart Rate(bpm):  132  Cardiac Activity:       Observed  Presentation:           Cephalic  Placenta:               Posterior  P. Cord Insertion:      Visualized, central  Amniotic Fluid  AFI FV:      Within normal limits  AFI Sum(cm)     %Tile       Largest Pocket(cm)  21.9            88          7.72  RUQ(cm)       RLQ(cm)       LUQ(cm)        LLQ(cm)  7.72          4.21          5.94           4.03 ---------------------------------------------------------------------- Biometry  BPD:      64.5  mm     G. Age:  26w 1d        < 1  %    CI:  70.75   %    70 - 86                                                          FL/HC:      20.2   %    19.2 - 21.4  HC:      244.4  mm     G. Age:  26w 4d        < 1  %    HC/AC:      1.08        0.99 - 1.21  AC:      227.3  mm     G. Age:  27w 1d        < 1  %    FL/BPD:     76.6   %    71 - 87  FL:       49.4  mm     G. Age:  26w 5d        < 1   %    FL/AC:      21.7   %    20 - 24  HUM:      44.2  mm     G. Age:  26w 2d        < 5  %  LV:          1  mm  Est. FW:     989  gm      2 lb 3 oz    < 1  % ---------------------------------------------------------------------- OB History  Gravidity:    6         Term:   0        Prem:   4        SAB:   1  Living:       3 ---------------------------------------------------------------------- Gestational Age  LMP:           28w 1d        Date:  08/02/20                 EDD:   05/09/21  U/S Today:     26w 5d                                        EDD:   05/19/21  Best:          29w 6d     Det. By:  U/S  (12/14/20)          EDD:   04/27/21 ---------------------------------------------------------------------- Anatomy  Cranium:               Appears normal         Aortic Arch:            Previously seen  Cavum:                 Appears normal         Ductal Arch:            Previously seen  Ventricles:            Appears normal         Diaphragm:  Previously seen  Choroid Plexus:        Previously seen        Stomach:                Appears normal, left                                                                        sided  Cerebellum:            Previously seen        Abdomen:                Appears normal  Posterior Fossa:       Previously seen        Abdominal Wall:         Previously seen  Nuchal Fold:           Previously seen        Cord Vessels:           Previously seen  Face:                  Orbits appear          Kidneys:                Appear normal                         normal  Lips:                  Previously seen        Bladder:                Appears normal  Thoracic:              Previously seen        Spine:                  Previously seen  Heart:                 Appears normal         Upper Extremities:      Previously seen                         (4CH, axis, and                         situs)  RVOT:                  Previously seen        Lower Extremities:      Previously seen   LVOT:                  Previously seen  Other:  Gender previously seen. VC, 3VV and 3VTV previously visualized. ---------------------------------------------------------------------- Doppler - Fetal Vessels  Umbilical Artery   S/D     %tile      RI    %tile      PI    %tile            ADFV    RDFV   4.31  97    0.77       96    1.21       90               No      No ---------------------------------------------------------------------- Cervix Uterus Adnexa  Cervix  Not visualized (advanced GA >24wks)  Uterus  No abnormality visualized.  Right Ovary  Not visualized.  Left Ovary  Not visualized.  Cul De Sac  No free fluid seen.  Adnexa  No adnexal mass visualized. ---------------------------------------------------------------------- Comments  This patient was seen for a follow up growth scan due to  advanced maternal age and chronic hypertension that is  treated with Procardia.  The patient was evaluated in the MAU about two weeks ago  due to elevated blood pressures.  She ruled out for  preeclampsia at that time and was switched from labetalol to  Procardia for treatment of her elevated blood pressures.  The patient's blood pressures in our office this morning were  187/103 and 176/104.  She reports that she has had a  headache and blurry vision for a a while now. She did not  take her antihypertensive medication today.  She has a  history of preeclampsia and IUGR in all of her prior  pregnancies.  She reports that she has given birth to children  weighing about 1 pound 8 ounces to about 3 pound 15  ounces and 3 pounds 16 ounces at around 36 weeks.  On today's exam, the EFW measures at the <1st percentile  for her gestational age indicating fetal growth restriction.  There was normal amniotic fluid noted.  Fetal movements were noted throughout today's ultrasound  exam.  Doppler studies of the umbilical arteries showed an elevated  S/D ratio of 4.31.  There were no signs of absent or reversed  end-diastolic  flow.  Due to her extremely elevated blood pressures and  complaints of headache and blurry vision, the patient was  advised to go to the MAU following today's ultrasound exam  for evaluation of preeclampsia and for blood pressure control.  In the MAU, she should have [place] labs drawn and have a P/C  ratio performed to evaluate for significant proteinuria.  Should  her blood pressures remain extremely elevated or if she is  diagnosed with preeclampsia, admission to the hospital and  the administration of a complete course of antenatal  corticosteroids may be necessary.  Should she be ruled out for preeclampsia and her blood  pressures are controlled after antihypertensive medications  are given, due to severe fetal growth restriction, we will  continue to follow her with weekly biophysical profiles and  umbilical artery Doppler studies.  The increased risk of an adverse pregnancy outcome such as  placental abruption, indicated preterm birth, and stillbirth due  to uncontrolled blood pressures was discussed today.  The patient stated that she needed to go get her paycheck  from her job first before going to the MAU.  She was advised  to go to the MAU once she gets her paycheck. ----------------------------------------------------------------------                   Johnell Comings, MD Electronically Signed Final Report   02/15/2021 01:30 pm ----------------------------------------------------------------------  [place] MFM UA CORD DOPPLER  Result Date: 02/15/2021 ----------------------------------------------------------------------  OBSTETRICS REPORT                       (Signed Final 02/15/2021 01:30 pm) ---------------------------------------------------------------------- Patient  Info  ID #:       [id number]                          D.O.B.:  [birth year] (39 yrs)  Name:       Marissa Meyer                  Visit Date: 02/15/2021 10:39 am ---------------------------------------------------------------------- Performed By   Attending:        Johnell Comings MD         Ref. Address:     [address]                                                             [place], [place]  Performed By:     Germain Osgood            [facility]                                     [facility] at                                                             [place] [facility]                                                             [facility]  Referred By:      [facility] ---------------------------------------------------------------------- Orders  #  Description                           Code        Ordered By  1  [place] MFM OB FOLLOW UP                   [id number]    RAVI SHANKAR  2  [place] MFM UA CORD DOPPLER                [id number]    RAVI SHANKAR ----------------------------------------------------------------------  #  Order #  Accession #                Episode #  1  [id number]                   [id number]                 [id number]  2  [id number]                   [id number]                 [id number] ---------------------------------------------------------------------- Indications  Hypertension - Chronic/Pre-existing -          O10.019  procardia  Advanced maternal age multigravida 56+,        O56.523  third trimester (39)  History of cesarean delivery x2, currently     O34.219  pregnant  History of pre-term deliveries (34, 35 weeks)  O09.219  Substance abuse affecting pregnancy,           O99.320 F19.10  antepartum (THC)  Tobacco use complicating pregnancy, third      O99.333  trimester  [redacted] weeks gestation of pregnancy                Z3A.29 ---------------------------------------------------------------------- Fetal Evaluation  Num Of Fetuses:         1  Fetal Heart Rate(bpm):  132  Cardiac Activity:       Observed  Presentation:           Cephalic  Placenta:               Posterior  P. Cord Insertion:       Visualized, central  Amniotic Fluid  AFI FV:      Within normal limits  AFI Sum(cm)     %Tile       Largest Pocket(cm)  21.9            88          7.72  RUQ(cm)       RLQ(cm)       LUQ(cm)        LLQ(cm)  7.72          4.21          5.94           4.03 ---------------------------------------------------------------------- Biometry  BPD:      64.5  mm     G. Age:  26w 1d        < 1  %    CI:        70.75   %    70 - 86                                                          FL/HC:      20.2   %    19.2 - 21.4  HC:      244.4  mm     G. Age:  26w 4d        < 1  %    HC/AC:      1.08        0.99 - 1.21  AC:      227.3  mm     G. Age:  27w 1d        <  1  %    FL/BPD:     76.6   %    71 - 87  FL:       49.4  mm     G. Age:  26w 5d        < 1  %    FL/AC:      21.7   %    20 - 24  HUM:      44.2  mm     G. Age:  26w 2d        < 5  %  LV:          1  mm  Est. FW:     989  gm      2 lb 3 oz    < 1  % ---------------------------------------------------------------------- OB History  Gravidity:    6         Term:   0        Prem:   4        SAB:   1  Living:       3 ---------------------------------------------------------------------- Gestational Age  LMP:           28w 1d        Date:  08/02/20                 EDD:   05/09/21  U/S Today:     26w 5d                                        EDD:   05/19/21  Best:          29w 6d     Det. By:  U/S  (12/14/20)          EDD:   04/27/21 ---------------------------------------------------------------------- Anatomy  Cranium:               Appears normal         Aortic Arch:            Previously seen  Cavum:                 Appears normal         Ductal Arch:            Previously seen  Ventricles:            Appears normal         Diaphragm:              Previously seen  Choroid Plexus:        Previously seen        Stomach:                Appears normal, left                                                                        sided  Cerebellum:            Previously seen         Abdomen:  Appears normal  Posterior Fossa:       Previously seen        Abdominal Wall:         Previously seen  Nuchal Fold:           Previously seen        Cord Vessels:           Previously seen  Face:                  Orbits appear          Kidneys:                Appear normal                         normal  Lips:                  Previously seen        Bladder:                Appears normal  Thoracic:              Previously seen        Spine:                  Previously seen  Heart:                 Appears normal         Upper Extremities:      Previously seen                         (4CH, axis, and                         situs)  RVOT:                  Previously seen        Lower Extremities:      Previously seen  LVOT:                  Previously seen  Other:  Gender previously seen. VC, 3VV and 3VTV previously visualized. ---------------------------------------------------------------------- Doppler - Fetal Vessels  Umbilical Artery   S/D     %tile      RI    %tile      PI    %tile            ADFV    RDFV   4.31       97    0.77       96    1.21       90               No      No ---------------------------------------------------------------------- Cervix Uterus Adnexa  Cervix  Not visualized (advanced GA >24wks)  Uterus  No abnormality visualized.  Right Ovary  Not visualized.  Left Ovary  Not visualized.  Cul De Sac  No free fluid seen.  Adnexa  No adnexal mass visualized. ---------------------------------------------------------------------- Comments  This patient was seen for a follow up growth scan due to  advanced maternal age and chronic hypertension that is  treated with Procardia.  The patient was evaluated in the MAU about two weeks ago  due to elevated blood pressures.  She ruled out for  preeclampsia at that time and was switched  from labetalol to  Procardia for treatment of her elevated blood pressures.  The patient's blood pressures in our office this morning were  187/103  and 176/104.  She reports that she has had a  headache and blurry vision for a a while now. She did not  take her antihypertensive medication today.  She has a  history of preeclampsia and IUGR in all of her prior  pregnancies.  She reports that she has given birth to children  weighing about 1 pound 8 ounces to about 3 pound 15  ounces and 3 pounds 16 ounces at around 36 weeks.  On today's exam, the EFW measures at the <1st percentile  for her gestational age indicating fetal growth restriction.  There was normal amniotic fluid noted.  Fetal movements were noted throughout today's ultrasound  exam.  Doppler studies of the umbilical arteries showed an elevated  S/D ratio of 4.31.  There were no signs of absent or reversed  end-diastolic flow.  Due to her extremely elevated blood pressures and  complaints of headache and blurry vision, the patient was  advised to go to the MAU following today's ultrasound exam  for evaluation of preeclampsia and for blood pressure control.  In the MAU, she should have [place] labs drawn and have a P/C  ratio performed to evaluate for significant proteinuria.  Should  her blood pressures remain extremely elevated or if she is  diagnosed with preeclampsia, admission to the hospital and  the administration of a complete course of antenatal  corticosteroids may be necessary.  Should she be ruled out for preeclampsia and her blood  pressures are controlled after antihypertensive medications  are given, due to severe fetal growth restriction, we will  continue to follow her with weekly biophysical profiles and  umbilical artery Doppler studies.  The increased risk of an adverse pregnancy outcome such as  placental abruption, indicated preterm birth, and stillbirth due  to uncontrolled blood pressures was discussed today.  The patient stated that she needed to go get her paycheck  from her job first before going to the MAU.  She was advised  to go to the MAU once she gets her paycheck.  ----------------------------------------------------------------------                   Johnell Comings, MD Electronically Signed Final Report   02/15/2021 01:30 pm ----------------------------------------------------------------------   MAU Course: Orders Placed This Encounter  Procedures  . Protein / creatinine ratio, urine  . Comprehensive metabolic panel  . CBC  . Notify Physician  . Measure blood pressure  . Discharge patient   Meds ordered this encounter  Medications  . AND Linked Order Group   . labetalol (NORMODYNE) injection 20 mg   . labetalol (NORMODYNE) injection 40 mg   . labetalol (NORMODYNE) injection 80 mg   . hydrALAZINE (APRESOLINE) injection 10 mg  . acetaminophen (OFIRMEV) IV 1,000 mg    Order Specific Question:   Is the patient UNABLE to take oral / enteral medications?    Answer:   Yes    Order Specific Question:   Does the patient have a contraindication to NSAID use? (If YES, specify on line 3)    Answer:   No    Order Specific Question:   Answers to IV acetaminophen criteria above:    Answer:   "Yes" to all criteria.  . famotidine (PEPCID) IVPB 20 mg premix  . ondansetron (ZOFRAN) injection 4 mg  . lactated ringers infusion  .  omeprazole (PRILOSEC) 20 MG capsule    Sig: Take 1 capsule (20 mg total) by mouth in the morning and at bedtime.    Dispense:  60 capsule    Refill:  3    Order Specific Question:   Supervising Provider    Answer:   Donnamae Jude [[id number]]   MDM: Labetalol protocol ordered after pt's 2nd severe range pressure. Headache relieved with lowered BP and IV tylenol. Reflux and nausea relieved with zofran and IV pepcid. PEC labs normal.  Discussed great importance of taking BP meds and started on new reflux medication BID. Advised to take procardia with food to prevent stomach upset. Pt verbalized understanding.  Assessment: 1. Chronic hypertension with exacerbation during pregnancy in third trimester   2. NST (non-stress test) reactive    3. Gastroesophageal reflux in pregnancy in third trimester   4. [redacted] weeks gestation of pregnancy   5. History of preterm labor    Plan: Discharge home in stable condition with preterm labor and return precautions    [place] [facility] [place] [facility]. Call.   Specialty: Obstetrics and Gynecology Why: to reschedule appointment - the office will reach out to you to get you rescheduled. Contact information: [address] [place]  [id number] [phone]              Allergies as of 02/16/2021      Reactions   Amoxicillin Hives   Has patient had a PCN reaction causing immediate rash, facial/tongue/throat swelling, SOB or lightheadedness with hypotension: Yes Has patient had a PCN reaction causing severe rash involving mucus membranes or skin necrosis: No Has patient had a PCN reaction that required hospitalization No Has patient had a PCN reaction occurring within the last 10 years: Yes If all of the above answers are "NO", then may proceed with Cephalosporin use.      Medication List    STOP taking these medications   AeroChamber Plus inhaler   famotidine 20 MG tablet Commonly known as: Pepcid   fluticasone 50 MCG/ACT nasal spray Commonly known as: FLONASE   metroNIDAZOLE 500 MG tablet Commonly known as: Flagyl   terconazole 0.4 % vaginal cream Commonly known as: TERAZOL 7     TAKE these medications   acetaminophen 325 MG tablet Commonly known as: Tylenol Take 2 tablets (650 mg total) by mouth every 4 (four) hours as needed for headache.   albuterol 108 (90 Base) MCG/ACT inhaler Commonly known as: VENTOLIN HFA Inhale 2 puffs into the lungs every 6 (six) hours as needed for wheezing or shortness of breath.   aspirin EC 81 MG tablet Take 1 tablet (81 mg total) by mouth daily.   iron polysaccharides 150 MG capsule Commonly known as: NIFEREX Take 1 capsule (150 mg total) by mouth every  other day.   NIFEdipine 60 MG 24 hr tablet Commonly known as: Procardia XL Take 1 tablet (60 mg total) by mouth daily.   omeprazole 20 MG capsule Commonly known as: PRILOSEC Take 1 capsule (20 mg total) by mouth in the morning and at bedtime.   ondansetron 4 MG disintegrating tablet Commonly known as: Zofran ODT Take 1 tablet (4 mg total) by mouth every 8 (eight) hours as needed for nausea or vomiting.   PRENATAL PO Take by mouth.   promethazine 12.5 MG tablet Commonly known as: PHENERGAN Take 1 tablet (12.5 mg total) by mouth every 6 (six) hours as needed for nausea  or vomiting.      Gaylan Gerold, CNM, MSN, [place] Certified Nurse Midwife, [facility]

## 2021-02-15 NOTE — Progress Notes (Signed)
Dr. Annamaria Boots aware of elevated BP'S.

## 2021-02-22 ENCOUNTER — Ambulatory Visit: Payer: Medicaid Other | Attending: Obstetrics

## 2021-02-22 ENCOUNTER — Other Ambulatory Visit: Payer: Self-pay

## 2021-02-25 ENCOUNTER — Inpatient Hospital Stay (HOSPITAL_BASED_OUTPATIENT_CLINIC_OR_DEPARTMENT_OTHER): Payer: Medicaid Other

## 2021-02-25 ENCOUNTER — Inpatient Hospital Stay (HOSPITAL_COMMUNITY)
Admission: AD | Admit: 2021-02-25 | Discharge: 2021-03-06 | DRG: 787 | Disposition: A | Discharge status: Home / Self Care | Payer: Medicaid Other | Attending: Obstetrics & Gynecology | Admitting: Obstetrics & Gynecology

## 2021-02-25 ENCOUNTER — Other Ambulatory Visit: Payer: Self-pay

## 2021-02-25 ENCOUNTER — Encounter (HOSPITAL_COMMUNITY): Payer: Self-pay | Admitting: Obstetrics and Gynecology

## 2021-02-25 DIAGNOSIS — O119 Pre-existing hypertension with pre-eclampsia, unspecified trimester: Secondary | ICD-10-CM | POA: Diagnosis present

## 2021-02-25 DIAGNOSIS — O114 Pre-existing hypertension with pre-eclampsia, complicating childbirth: Secondary | ICD-10-CM | POA: Diagnosis present

## 2021-02-25 DIAGNOSIS — O133 Gestational [pregnancy-induced] hypertension without significant proteinuria, third trimester: Secondary | ICD-10-CM | POA: Diagnosis not present

## 2021-02-25 DIAGNOSIS — O09213 Supervision of pregnancy with history of pre-term labor, third trimester: Secondary | ICD-10-CM

## 2021-02-25 DIAGNOSIS — O34211 Maternal care for low transverse scar from previous cesarean delivery: Secondary | ICD-10-CM | POA: Diagnosis present

## 2021-02-25 DIAGNOSIS — Z975 Presence of (intrauterine) contraceptive device: Secondary | ICD-10-CM

## 2021-02-25 DIAGNOSIS — Z3A32 32 weeks gestation of pregnancy: Secondary | ICD-10-CM

## 2021-02-25 DIAGNOSIS — O0930 Supervision of pregnancy with insufficient antenatal care, unspecified trimester: Secondary | ICD-10-CM

## 2021-02-25 DIAGNOSIS — O09523 Supervision of elderly multigravida, third trimester: Secondary | ICD-10-CM

## 2021-02-25 DIAGNOSIS — N179 Acute kidney failure, unspecified: Secondary | ICD-10-CM | POA: Diagnosis present

## 2021-02-25 DIAGNOSIS — O99324 Drug use complicating childbirth: Secondary | ICD-10-CM | POA: Diagnosis present

## 2021-02-25 DIAGNOSIS — F17210 Nicotine dependence, cigarettes, uncomplicated: Secondary | ICD-10-CM | POA: Diagnosis present

## 2021-02-25 DIAGNOSIS — O34219 Maternal care for unspecified type scar from previous cesarean delivery: Secondary | ICD-10-CM

## 2021-02-25 DIAGNOSIS — Z3A31 31 weeks gestation of pregnancy: Secondary | ICD-10-CM

## 2021-02-25 DIAGNOSIS — F1910 Other psychoactive substance abuse, uncomplicated: Secondary | ICD-10-CM

## 2021-02-25 DIAGNOSIS — O365930 Maternal care for other known or suspected poor fetal growth, third trimester, not applicable or unspecified: Secondary | ICD-10-CM | POA: Diagnosis present

## 2021-02-25 DIAGNOSIS — Z880 Allergy status to penicillin: Secondary | ICD-10-CM | POA: Diagnosis not present

## 2021-02-25 DIAGNOSIS — O26833 Pregnancy related renal disease, third trimester: Secondary | ICD-10-CM | POA: Diagnosis present

## 2021-02-25 DIAGNOSIS — O1414 Severe pre-eclampsia complicating childbirth: Secondary | ICD-10-CM | POA: Diagnosis not present

## 2021-02-25 DIAGNOSIS — O1002 Pre-existing essential hypertension complicating childbirth: Secondary | ICD-10-CM | POA: Diagnosis present

## 2021-02-25 DIAGNOSIS — O1413 Severe pre-eclampsia, third trimester: Secondary | ICD-10-CM

## 2021-02-25 DIAGNOSIS — Z8616 Personal history of COVID-19: Secondary | ICD-10-CM | POA: Diagnosis not present

## 2021-02-25 DIAGNOSIS — O99323 Drug use complicating pregnancy, third trimester: Secondary | ICD-10-CM

## 2021-02-25 DIAGNOSIS — Z20822 Contact with and (suspected) exposure to covid-19: Secondary | ICD-10-CM | POA: Diagnosis present

## 2021-02-25 DIAGNOSIS — O99334 Smoking (tobacco) complicating childbirth: Secondary | ICD-10-CM | POA: Diagnosis present

## 2021-02-25 DIAGNOSIS — Z30430 Encounter for insertion of intrauterine contraceptive device: Secondary | ICD-10-CM | POA: Diagnosis not present

## 2021-02-25 DIAGNOSIS — O365990 Maternal care for other known or suspected poor fetal growth, unspecified trimester, not applicable or unspecified: Secondary | ICD-10-CM | POA: Diagnosis not present

## 2021-02-25 DIAGNOSIS — O10013 Pre-existing essential hypertension complicating pregnancy, third trimester: Secondary | ICD-10-CM

## 2021-02-25 DIAGNOSIS — O365910 Maternal care for other known or suspected poor fetal growth, first trimester, not applicable or unspecified: Secondary | ICD-10-CM | POA: Diagnosis not present

## 2021-02-25 DIAGNOSIS — O99333 Smoking (tobacco) complicating pregnancy, third trimester: Secondary | ICD-10-CM

## 2021-02-25 DIAGNOSIS — O0990 Supervision of high risk pregnancy, unspecified, unspecified trimester: Secondary | ICD-10-CM

## 2021-02-25 DIAGNOSIS — F1410 Cocaine abuse, uncomplicated: Secondary | ICD-10-CM | POA: Diagnosis present

## 2021-02-25 HISTORY — DX: Severe pre-eclampsia, third trimester: O14.13

## 2021-02-25 LAB — RAPID URINE DRUG SCREEN, HOSP PERFORMED
Amphetamines: NOT DETECTED
Barbiturates: NOT DETECTED
Benzodiazepines: NOT DETECTED
Cocaine: POSITIVE — AB
Opiates: NOT DETECTED
Tetrahydrocannabinol: NOT DETECTED

## 2021-02-25 LAB — CBC
HCT: 39.8 % (ref 36.0–46.0)
Hemoglobin: 13.5 g/dL (ref 12.0–15.0)
MCH: 30.7 pg (ref 26.0–34.0)
MCHC: 33.9 g/dL (ref 30.0–36.0)
MCV: 90.5 fL (ref 80.0–100.0)
Platelets: 165 10*3/uL (ref 150–400)
RBC: 4.4 MIL/uL (ref 3.87–5.11)
RDW: 12.8 % (ref 11.5–15.5)
WBC: 9.3 10*3/uL (ref 4.0–10.5)
nRBC: 0 % (ref 0.0–0.2)

## 2021-02-25 LAB — URINALYSIS, ROUTINE W REFLEX MICROSCOPIC
Bilirubin Urine: NEGATIVE
Glucose, UA: NEGATIVE mg/dL
Ketones, ur: NEGATIVE mg/dL
Leukocytes,Ua: NEGATIVE
Nitrite: NEGATIVE
Protein, ur: NEGATIVE mg/dL
Specific Gravity, Urine: 1.004 — ABNORMAL LOW (ref 1.005–1.030)
pH: 7 (ref 5.0–8.0)

## 2021-02-25 LAB — COMPREHENSIVE METABOLIC PANEL
ALT: 20 U/L (ref 0–44)
AST: 30 U/L (ref 15–41)
Albumin: 2.9 g/dL — ABNORMAL LOW (ref 3.5–5.0)
Alkaline Phosphatase: 173 U/L — ABNORMAL HIGH (ref 38–126)
Anion gap: 8 (ref 5–15)
BUN: <5 mg/dL — ABNORMAL LOW (ref 6–20)
CO2: 22 mmol/L (ref 22–32)
Calcium: 8.6 mg/dL — ABNORMAL LOW (ref 8.9–10.3)
Chloride: 104 mmol/L (ref 98–111)
Creatinine, Ser: 0.66 mg/dL (ref 0.44–1.00)
GFR, Estimated: >60 mL/min (ref >60)
Glucose, Bld: 81 mg/dL (ref 70–99)
Potassium: 3.5 mmol/L (ref 3.5–5.1)
Sodium: 134 mmol/L — ABNORMAL LOW (ref 135–145)
Total Bilirubin: 0.3 mg/dL (ref 0.3–1.2)
Total Protein: 6.4 g/dL — ABNORMAL LOW (ref 6.5–8.1)

## 2021-02-25 LAB — PROTEIN / CREATININE RATIO, URINE
Creatinine, Urine: 16.88 mg/dL
Protein Creatinine Ratio: 0.65 mg/mg{Cre} — ABNORMAL HIGH (ref 0.00–0.15)
Total Protein, Urine: 11 mg/dL

## 2021-02-25 LAB — RESP PANEL BY RT-PCR (FLU A&B, COVID) ARPGX2
Influenza A by PCR: NEGATIVE
Influenza B by PCR: NEGATIVE
SARS Coronavirus 2 by RT PCR: NEGATIVE

## 2021-02-25 LAB — HIV ANTIBODY (ROUTINE TESTING W REFLEX): HIV Screen 4th Generation wRfx: NONREACTIVE

## 2021-02-25 LAB — TYPE AND SCREEN
ABO/RH(D): O POS
Antibody Screen: NEGATIVE

## 2021-02-25 MED ORDER — LABETALOL HCL 5 MG/ML IV SOLN: 80.0000 mg | INTRAVENOUS | Status: DC | PRN

## 2021-02-25 MED ORDER — CALCIUM CARBONATE ANTACID 500 MG PO CHEW: 2.0000 | CHEWABLE_TABLET | ORAL | Status: DC | PRN

## 2021-02-25 MED ORDER — DIPHENHYDRAMINE HCL 50 MG/ML IJ SOLN
25.0000 mg | Freq: Once | INTRAMUSCULAR | Status: AC
  Administered 2021-02-25: 25 mg via INTRAVENOUS
  Filled 2021-02-25: qty 1

## 2021-02-25 MED ORDER — MAGNESIUM SULFATE BOLUS VIA INFUSION
4.0000 g | Freq: Once | INTRAVENOUS | Status: AC
  Administered 2021-02-25: 4 g via INTRAVENOUS
  Filled 2021-02-25: qty 1000

## 2021-02-25 MED ORDER — HYDRALAZINE HCL 20 MG/ML IJ SOLN
10.0000 mg | INTRAMUSCULAR | Status: DC | PRN
  Administered 2021-02-25 – 2021-03-02 (×2): 10 mg via INTRAVENOUS
  Filled 2021-02-25 (×3): qty 1

## 2021-02-25 MED ORDER — DEXAMETHASONE SODIUM PHOSPHATE 10 MG/ML IJ SOLN
10.0000 mg | Freq: Once | INTRAMUSCULAR | Status: AC
  Administered 2021-02-25: 10 mg via INTRAVENOUS
  Filled 2021-02-25: qty 1

## 2021-02-25 MED ORDER — PRENATAL MULTIVITAMIN CH
1.0000 | ORAL_TABLET | Freq: Every day | ORAL | Status: DC
  Administered 2021-02-26 – 2021-03-04 (×7): 1 via ORAL
  Filled 2021-02-25 (×7): qty 1

## 2021-02-25 MED ORDER — LACTATED RINGERS IV SOLN: INTRAVENOUS | Status: DC

## 2021-02-25 MED ORDER — NIFEDIPINE ER OSMOTIC RELEASE 60 MG PO TB24
60.0000 mg | ORAL_TABLET | Freq: Two times a day (BID) | ORAL | Status: DC
  Administered 2021-02-25 – 2021-03-04 (×14): 60 mg via ORAL
  Filled 2021-02-25 (×15): qty 1

## 2021-02-25 MED ORDER — DOCUSATE SODIUM 100 MG PO CAPS
100.0000 mg | ORAL_CAPSULE | Freq: Two times a day (BID) | ORAL | Status: DC | PRN
  Administered 2021-02-27: 100 mg via ORAL
  Filled 2021-02-25: qty 1

## 2021-02-25 MED ORDER — MAGNESIUM SULFATE 40 GM/1000ML IV SOLN
2.0000 g/h | INTRAVENOUS | Status: AC
  Administered 2021-02-25 – 2021-02-26 (×2): 2 g/h via INTRAVENOUS
  Filled 2021-02-25 (×2): qty 1000

## 2021-02-25 MED ORDER — METOCLOPRAMIDE HCL 5 MG/ML IJ SOLN
10.0000 mg | Freq: Once | INTRAMUSCULAR | Status: AC
  Administered 2021-02-25: 10 mg via INTRAVENOUS
  Filled 2021-02-25: qty 2

## 2021-02-25 MED ORDER — BETAMETHASONE SOD PHOS & ACET 6 (3-3) MG/ML IJ SUSP
12.0000 mg | INTRAMUSCULAR | Status: AC
  Administered 2021-02-25 – 2021-02-26 (×2): 12 mg via INTRAMUSCULAR
  Filled 2021-02-25: qty 5

## 2021-02-25 MED ORDER — ACETAMINOPHEN 325 MG PO TABS
650.0000 mg | ORAL_TABLET | ORAL | Status: DC | PRN
  Administered 2021-02-26 – 2021-03-02 (×5): 650 mg via ORAL
  Filled 2021-02-25 (×5): qty 2

## 2021-02-25 MED ORDER — LABETALOL HCL 5 MG/ML IV SOLN: 40.0000 mg | INTRAVENOUS | Status: DC | PRN

## 2021-02-25 MED ORDER — LABETALOL HCL 5 MG/ML IV SOLN
80.0000 mg | INTRAVENOUS | Status: DC | PRN
  Administered 2021-02-25 – 2021-03-04 (×3): 80 mg via INTRAVENOUS
  Filled 2021-02-25 (×3): qty 16

## 2021-02-25 MED ORDER — LABETALOL HCL 5 MG/ML IV SOLN: 20.0000 mg | INTRAVENOUS | Status: DC | PRN

## 2021-02-25 MED ORDER — PANTOPRAZOLE SODIUM 40 MG PO TBEC
40.0000 mg | DELAYED_RELEASE_TABLET | Freq: Every day | ORAL | Status: DC
  Administered 2021-02-25 – 2021-03-04 (×8): 40 mg via ORAL
  Filled 2021-02-25 (×8): qty 1

## 2021-02-25 MED ORDER — LABETALOL HCL 5 MG/ML IV SOLN
40.0000 mg | INTRAVENOUS | Status: DC | PRN
  Administered 2021-02-25 – 2021-03-02 (×2): 40 mg via INTRAVENOUS
  Filled 2021-02-25 (×3): qty 8

## 2021-02-25 MED ORDER — HYDRALAZINE HCL 20 MG/ML IJ SOLN: 10.0000 mg | INTRAMUSCULAR | Status: DC | PRN

## 2021-02-25 MED ORDER — ACETAMINOPHEN 500 MG PO TABS: 1000.0000 mg | ORAL_TABLET | Freq: Once | ORAL | Status: DC

## 2021-02-25 MED ORDER — LABETALOL HCL 5 MG/ML IV SOLN
20.0000 mg | INTRAVENOUS | Status: DC | PRN
  Administered 2021-02-25 – 2021-03-05 (×3): 20 mg via INTRAVENOUS
  Filled 2021-02-25 (×5): qty 4

## 2021-02-25 NOTE — MAU Note (Signed)
Marissa Meyer is a 40 y.o. at [redacted]w[redacted]d here in MAU reporting: headache that started this AM. States the only medication she has taken is her BP meds. Also seeing spots. No RUQ pain. No bleeding or LOF. +FM  Onset of complaint: today  Pain score: 8/10  Vitals:   02/25/21 1252  BP: (!) 201/119  Pulse: (!) 104  Resp: 16  Temp: 98.5 F (36.9 C)  SpO2: 100%     FHT: EFM applied in room  Lab orders placed from triage: UA

## 2021-02-25 NOTE — H&P (Addendum)
Obstetrics Admission History & Physical  02/25/2021 - 4:10 PM Primary OBGYN: [facility]  Chief Complaint: severe pre-eclampsia (BPs, PC ratio, HA)  History of Present Illness  40 y.o. [id number] @ [redacted]w[redacted]d, with the above CC. Pregnancy complicated by: cHTN, severe FGR, cocaine abuse, trich, h/o PTB at 35wks due to PTL in 2017, h/o VBAC, h/o severe pre-eclampsia, h/o FGR.   [name] states that she had elevated BPs and HA. Patient with severe range BPs; she states she took her morning procardia. She states last cocaine use (nasal) was a month ago. Patient received IV labetalol protocol and hydralazine.   Review of Systems:as noted in the History of Present Illness.  Patient Active Problem List   Diagnosis Date Noted   Severe pre-eclampsia, antepartum, third trimester 02/25/2021   Severe pre-eclampsia 02/25/2021   Trichimoniasis 12/24/2020   History of trichomoniasis 12/21/2020   Multigravida of advanced maternal age in second trimester 12/21/2020   History of VBAC 12/21/2020   Prediabetes 12/21/2020   Supervision of high risk pregnancy, antepartum 10/09/2020   History of substance abuse ([place]) 10/09/2020   Chronic hypertension with exacerbation during pregnancy 09/19/2015   Cocaine abuse affecting pregnancy ([place]) 03/14/2014   History of preterm labor 02/20/2014   Tobacco use in pregnancy, antepartum 02/20/2014     PMHx:  Past Medical History:  Diagnosis Date   Anemia    Chlamydia    COVID-19    Ectopic pregnancy    Gonorrhea    Hypertension    Mental disorder    Migraines    but not diagnosed   Pregnancy induced hypertension    Preterm delivery    Trichomonas    PSHx:  Past Surgical History:  Procedure Laterality Date   CESAREAN SECTION N/A 03/21/2014   Procedure: CESAREAN SECTION;  Surgeon: Osborne Oman, MD;  Location: [facility];  Service: Obstetrics;  Laterality: N/A;   DILATION AND CURETTAGE OF UTERUS     ECTOPIC PREGNANCY SURGERY     Medications:   Medications Prior to Admission  Medication Sig Dispense Refill Last Dose   acetaminophen (TYLENOL) 325 MG tablet Take 2 tablets (650 mg total) by mouth every 4 (four) hours as needed for headache. 180 tablet 0    albuterol (VENTOLIN HFA) 108 (90 Base) MCG/ACT inhaler Inhale 2 puffs into the lungs every 6 (six) hours as needed for wheezing or shortness of breath. 18 g 0    aspirin EC 81 MG tablet Take 1 tablet (81 mg total) by mouth daily. (Patient not taking: Reported on 01/11/2021) 60 tablet 2    iron polysaccharides (NIFEREX) 150 MG capsule Take 1 capsule (150 mg total) by mouth every other day. 15 capsule 0    NIFEdipine (PROCARDIA XL) 60 MG 24 hr tablet Take 1 tablet (60 mg total) by mouth daily. 30 tablet 3    omeprazole (PRILOSEC) 20 MG capsule Take 1 capsule (20 mg total) by mouth in the morning and at bedtime. 60 capsule 3    ondansetron (ZOFRAN ODT) 4 MG disintegrating tablet Take 1 tablet (4 mg total) by mouth every 8 (eight) hours as needed for nausea or vomiting. (Patient not taking: Reported on 12/14/2020) 20 tablet 0    Prenatal Vit-Fe Fumarate-FA (PRENATAL PO) Take by mouth.      promethazine (PHENERGAN) 12.5 MG tablet Take 1 tablet (12.5 mg total) by mouth every 6 (six) hours as needed for nausea or vomiting. 30 tablet 0      Allergies: is allergic  to amoxicillin. OBHx:  OB History  Gravida Para Term Preterm AB Living  6 4 0 4 1 4   SAB IAB Ectopic Multiple Live Births      1 0 4    # Outcome Date GA Lbr Len/2nd Weight Sex Delivery Anes PTL Lv  6 Current           5 Preterm 09/19/15 [redacted]w[redacted]d  1315 g M Vag-Spont None  LIV  4 Preterm 03/21/14 [redacted]w[redacted]d  1769 g M CS-LTranv Spinal  LIV  3 Preterm 10/31/09    M Vag-Spont   LIV  2 Ectopic           1 Preterm     F Vag-Spont   LIV        FHx:  Family History  Problem Relation Age of Onset   Hypertension Mother    Hypertension Maternal Grandmother    Cancer Maternal Grandfather    Hypertension Father    Soc Hx:  Social History    Socioeconomic History   Marital status: Single    Spouse name: Not on file   Number of children: Not on file   Years of education: Not on file   Highest education level: Not on file  Occupational History   Not on file  Tobacco Use   Smoking status: Every Day    Packs/day: 0.25    Pack years: 0.00    Types: Cigars, Cigarettes    Last attempt to quit: 08/13/2013    Years since quitting: 7.5   Smokeless tobacco: Never   Tobacco comments:    black and milds; 2-3 per day  Vaping Use   Vaping Use: Never used  Substance and Sexual Activity   Alcohol use: No   Drug use: Not Currently    Frequency: 1.0 times per week    Types: Marijuana   Sexual activity: Yes    Birth control/protection: None    Comment: Last intercourse 1 day ago  Other Topics Concern   Not on file  Social History Narrative   Not on file   Social Determinants of Health   Financial Resource Strain: Not on file  Food Insecurity: No Food Insecurity   Worried About Estate manager/land agent of Food in the Last Year: Never true   Ran Out of Food in the Last Year: Never true  Transportation Needs: No Transportation Needs   Lack of Transportation (Medical): No   Lack of Transportation (Non-Medical): No  Physical Activity: Not on file  Stress: Not on file  Social Connections: Not on file  Intimate Partner Violence: Not on file    Objective    Current Vital Signs 24h Vital Sign Ranges  T 98.5 F (36.9 C) Temp  Avg: 98.5 F (36.9 C)  Min: 98.5 F (36.9 C)  Max: 98.5 F (36.9 C)  BP (!) 135/92  BP  Min: 121/83  Max: 201/119  HR (!) 103  Pulse  Avg: 97.2  Min: 90  Max: 104  RR 16 Resp  Avg: 16  Min: 16  Max: 16  SaO2 100 %   SpO2  Avg: 98.3 %  Min: 97 %  Max: 100 %       24 Hour I/O Current Shift I/O  Time Ins Outs No intake/output data recorded. No intake/output data recorded.    Patient Vitals for the past 24 hrs:  BP Temp Temp src Pulse Resp SpO2  02/25/21 1614 136/77 99 F (37.2 C) Oral (!) 102 16 100 %  02/25/21 1546 (!) 135/92 -- -- (!) 103 -- 100 %  02/25/21 1534 (!) 155/85 98.5 F (36.9 C) Oral 100 16 100 %  02/25/21 1451 134/87 -- -- (!) 102 -- 97 %  02/25/21 1441 135/86 -- -- 94 -- 98 %  02/25/21 1431 124/81 -- -- 94 -- 98 %  02/25/21 1421 (!) 144/90 -- -- 97 -- 98 %  02/25/21 1411 121/83 -- -- (!) 102 -- 98 %  02/25/21 1351 (!) 144/96 -- -- 92 -- 97 %  02/25/21 1341 (!) 160/103 -- -- 93 -- 97 %  02/25/21 1331 (!) 166/111 -- -- 90 -- 98 %  02/25/21 1321 (!) 191/117 -- -- 95 -- --  02/25/21 1252 (!) 201/119 98.5 F (36.9 C) Oral (!) 104 16 100 %   \ EFM: 140 baseline, ?accel, no decel, min to mod variability Toco: quiet  General: Well nourished, well developed female in no acute distress.  Skin:  Warm and dry.  Cardiovascular: S1, S2 normal, no murmur, rub or gallop, regular rate and rhythm Respiratory:  Clear to auscultation bilateral. Normal respiratory effort Abdomen: gravid Neuro/Psych:  Normal mood and affect.   Labs  +cocaine PC ratio  650  Recent Labs  Lab 02/25/21 1303  WBC 9.3  HGB 13.5  HCT 39.8  PLT 165    Recent Labs  Lab 02/25/21 1303  NA 134*  K 3.5  CL 104  CO2 22  BUN <5*  CREATININE 0.66  CALCIUM 8.6*  PROT 6.4*  BILITOT 0.3  ALKPHOS 173*  ALT 20  AST 30  GLUCOSE 81    Radiology 6/16: bpp and ua dopplers pending 6/6: 989g, efw <1%, ac 1%, afi 22, elevated UA dopplers  Assessment & Plan   40 y.o. [id number] @ [redacted]w[redacted]d with severe pre-eclampsia superimposd on cHTN; pt imprved *Pregnancy: fetal status reassuring. Continueous efm while on Mg. D/w pt re: if desires BTL *severe pre-x: d/w her inpatient until delivery and hope to get her 34wks. HA resolved and pt feeling better.  *FGR: qwk dopplers at least. See if any more frequent ultrasound testing *Preterm: BMZ today and tomorrow. Mg for severe pre-x and fetal neuroprotection. NICU consulted *Social: SW consulted *h/o VBAC: will need to d/w pt more re: VBAC this pregnancy again *h/o  trich: needs toc. Ordered  Durene Romans MD Attending [facility]) GYN Consult Phone: [phone] (M-F, 0800-1700) & [phone] (Off hours, weekends, holidays)
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
## 2021-02-25 NOTE — MAU Provider Note (Signed)
History     CSN: [id number]  Arrival date and time: 02/25/21 1228  Event Date/Time  First Provider Initiated Contact with Patient 02/25/21 1255     Chief Complaint  Patient presents with   Headache   HPI DUAA Marissa Meyer is a 40 y.o. [id number] at [redacted]w[redacted]d who presents to MAU with chief complaint of headache in the setting of Chronic Hypertension. This is a recurrent problem, current episode began early this morning. Patient states she needed to get her son settled for his EOGs at school but "just barely made it to MAU". She endorses taking her Procardia 60 XL around 0800 today. She denies RUQ/epigastric pain, new onset swelling or weight gain.  Blurry Vision This is a recurrent problem, reported during previous MAU evaluations. Patient states her headaches are "almost always" accompanied by blurry vision. They typically resolve with resolution of headache pain.  She denies vaginal bleeding, leaking of fluid, decreased fetal movement, fever, falls, or recent illness.   Patient receives care with [facility]. She has been able to attend MFM appointments but has not been seen for [facility] appointment since 12/2020.   OB History     Gravida  6   Para  4   Term  0   Preterm  4   AB  1   Living  4      SAB      IAB      Ectopic  1   Multiple  0   Live Births  4           Past Medical History:  Diagnosis Date   Anemia    Chlamydia    COVID-19    Ectopic pregnancy    Gonorrhea    Hypertension    Mental disorder    Migraines    but not diagnosed   Pregnancy induced hypertension    Preterm delivery    Trichomonas     Past Surgical History:  Procedure Laterality Date   CESAREAN SECTION N/A 03/21/2014   Procedure: CESAREAN SECTION;  Surgeon: Osborne Oman, MD;  Location: [facility];  Service: Obstetrics;  Laterality: N/A;   DILATION AND CURETTAGE OF UTERUS     ECTOPIC PREGNANCY SURGERY      Family History  Problem Relation Age of Onset   Hypertension Mother     Hypertension Maternal Grandmother    Cancer Maternal Grandfather    Hypertension Father     Social History   Tobacco Use   Smoking status: Every Day    Packs/day: 0.25    Pack years: 0.00    Types: Cigars, Cigarettes    Last attempt to quit: 08/13/2013    Years since quitting: 7.5   Smokeless tobacco: Never   Tobacco comments:    black and milds; 2-3 per day  Vaping Use   Vaping Use: Never used  Substance Use Topics   Alcohol use: No   Drug use: Not Currently    Frequency: 1.0 times per week    Types: Marijuana    Allergies:  Allergies  Allergen Reactions   Amoxicillin Hives    Has patient had a PCN reaction causing immediate rash, facial/tongue/throat swelling, SOB or lightheadedness with hypotension: Yes Has patient had a PCN reaction causing severe rash involving mucus membranes or skin necrosis: No Has patient had a PCN reaction that required hospitalization No Has patient had a PCN reaction occurring within the last 10 years: Yes If all of the above answers are "NO", then may  proceed with Cephalosporin use.    Medications Prior to Admission  Medication Sig Dispense Refill Last Dose   acetaminophen (TYLENOL) 325 MG tablet Take 2 tablets (650 mg total) by mouth every 4 (four) hours as needed for headache. 180 tablet 0    albuterol (VENTOLIN HFA) 108 (90 Base) MCG/ACT inhaler Inhale 2 puffs into the lungs every 6 (six) hours as needed for wheezing or shortness of breath. 18 g 0    aspirin EC 81 MG tablet Take 1 tablet (81 mg total) by mouth daily. (Patient not taking: Reported on 01/11/2021) 60 tablet 2    iron polysaccharides (NIFEREX) 150 MG capsule Take 1 capsule (150 mg total) by mouth every other day. 15 capsule 0    NIFEdipine (PROCARDIA XL) 60 MG 24 hr tablet Take 1 tablet (60 mg total) by mouth daily. 30 tablet 3    omeprazole (PRILOSEC) 20 MG capsule Take 1 capsule (20 mg total) by mouth in the morning and at bedtime. 60 capsule 3    ondansetron (ZOFRAN ODT) 4 MG  disintegrating tablet Take 1 tablet (4 mg total) by mouth every 8 (eight) hours as needed for nausea or vomiting. (Patient not taking: Reported on 12/14/2020) 20 tablet 0    Prenatal Vit-Fe Fumarate-FA (PRENATAL PO) Take by mouth.      promethazine (PHENERGAN) 12.5 MG tablet Take 1 tablet (12.5 mg total) by mouth every 6 (six) hours as needed for nausea or vomiting. 30 tablet 0     Review of Systems  Constitutional:  Positive for fatigue.  Neurological:  Positive for headaches.  All other systems reviewed and are negative. Physical Exam   Blood pressure (!) 155/85, pulse 100, temperature 98.5 F (36.9 C), temperature source Oral, resp. rate 16, last menstrual period 08/02/2020, SpO2 100 %.  Physical Exam Vitals and nursing note reviewed.  Constitutional:      Appearance: She is well-developed.  Cardiovascular:     Rate and Rhythm: Normal rate.     Heart sounds: Normal heart sounds.  Pulmonary:     Effort: Pulmonary effort is normal.     Breath sounds: Normal breath sounds.  Abdominal:     Comments: Gravid  Skin:    General: Skin is warm and dry.     Capillary Refill: Capillary refill takes less than 2 seconds.  Neurological:     Mental Status: She is alert and oriented to person, place, and time.  Psychiatric:        Mood and Affect: Mood normal.        Behavior: Behavior normal.    MAU Course  Procedures  --Fetal tracing initially reactive, subsequently nonreactive once patient became normotensive --Deceleration at 1405 --Headache resolved with HA cocktail. Patient awoke when RN entered room, reported pain score 4/10   Orders Placed This Encounter  Procedures   Resp Panel by RT-PCR (Flu A&B, Covid) Nasopharyngeal Swab   [place] MFM Fetal BPP Wo Non Stress   [place] MFM UA CORD DOPPLER   Urinalysis, Routine w reflex microscopic Urine, Clean Catch   CBC   Comprehensive metabolic panel   Protein / creatinine ratio, urine   Rapid urine drug screen (hospital performed)   Notify  Physician   Measure blood pressure   Blood draw with IV start   Patient Vitals for the past 24 hrs:  BP Temp Temp src Pulse Resp SpO2  02/25/21 1546 (!) 135/92 -- -- (!) 103 -- 100 %  02/25/21 1534 (!) 155/85 98.5 F (36.9 C)  Oral 100 16 100 %  02/25/21 1451 134/87 -- -- (!) 102 -- 97 %  02/25/21 1441 135/86 -- -- 94 -- 98 %  02/25/21 1431 124/81 -- -- 94 -- 98 %  02/25/21 1421 (!) 144/90 -- -- 97 -- 98 %  02/25/21 1411 121/83 -- -- (!) 102 -- 98 %  02/25/21 1351 (!) 144/96 -- -- 92 -- 97 %  02/25/21 1341 (!) 160/103 -- -- 93 -- 97 %  02/25/21 1331 (!) 166/111 -- -- 90 -- 98 %  02/25/21 1321 (!) 191/117 -- -- 95 -- --  02/25/21 1252 (!) 201/119 98.5 F (36.9 C) Oral (!) 104 16 100 %   Results for orders placed or performed during the hospital encounter of 02/25/21 (from the past 24 hour(s))  Urinalysis, Routine w reflex microscopic Urine, Clean Catch     Status: Abnormal   Collection Time: 02/25/21 12:38 PM  Result Value Ref Range   Color, Urine COLORLESS (A) YELLOW   APPearance CLEAR CLEAR   Specific Gravity, Urine 1.004 (L) 1.005 - 1.030   pH 7.0 5.0 - 8.0   Glucose, UA NEGATIVE NEGATIVE mg/dL   Hgb urine dipstick SMALL (A) NEGATIVE   Bilirubin Urine NEGATIVE NEGATIVE   Ketones, ur NEGATIVE NEGATIVE mg/dL   Protein, ur NEGATIVE NEGATIVE mg/dL   Nitrite NEGATIVE NEGATIVE   Leukocytes,Ua NEGATIVE NEGATIVE   RBC / HPF 0-5 0 - 5 RBC/hpf   WBC, UA 0-5 0 - 5 WBC/hpf   Bacteria, UA RARE (A) NONE SEEN   Squamous Epithelial / LPF 0-5 0 - 5  Protein / creatinine ratio, urine     Status: Abnormal   Collection Time: 02/25/21 12:38 PM  Result Value Ref Range   Creatinine, Urine 16.88 mg/dL   Total Protein, Urine 11 mg/dL   Protein Creatinine Ratio 0.65 (H) 0.00 - 0.15 mg/mg[Cre]  Rapid urine drug screen (hospital performed)     Status: Abnormal   Collection Time: 02/25/21 12:38 PM  Result Value Ref Range   Opiates NONE DETECTED NONE DETECTED   Cocaine POSITIVE (A) NONE DETECTED    Benzodiazepines NONE DETECTED NONE DETECTED   Amphetamines NONE DETECTED NONE DETECTED   Tetrahydrocannabinol NONE DETECTED NONE DETECTED   Barbiturates NONE DETECTED NONE DETECTED  CBC     Status: None   Collection Time: 02/25/21  1:03 PM  Result Value Ref Range   WBC 9.3 4.0 - 10.5 K/uL   RBC 4.40 3.87 - 5.11 MIL/uL   Hemoglobin 13.5 12.0 - 15.0 g/dL   HCT 39.8 36.0 - 46.0 %   MCV 90.5 80.0 - 100.0 fL   MCH 30.7 26.0 - 34.0 pg   MCHC 33.9 30.0 - 36.0 g/dL   RDW 12.8 11.5 - 15.5 %   Platelets 165 150 - 400 K/uL   nRBC 0.0 0.0 - 0.2 %  Comprehensive metabolic panel     Status: Abnormal   Collection Time: 02/25/21  1:03 PM  Result Value Ref Range   Sodium 134 (L) 135 - 145 mmol/L   Potassium 3.5 3.5 - 5.1 mmol/L   Chloride 104 98 - 111 mmol/L   CO2 22 22 - 32 mmol/L   Glucose, Bld 81 70 - 99 mg/dL   BUN <5 (L) 6 - 20 mg/dL   Creatinine, Ser 0.66 0.44 - 1.00 mg/dL   Calcium 8.6 (L) 8.9 - 10.3 mg/dL   Total Protein 6.4 (L) 6.5 - 8.1 g/dL   Albumin 2.9 (L) 3.5 - 5.0  g/dL   AST 30 15 - 41 U/L   ALT 20 0 - 44 U/L   Alkaline Phosphatase 173 (H) 38 - 126 U/L   Total Bilirubin 0.3 0.3 - 1.2 mg/dL   GFR, Estimated >60 >60 mL/min   Anion gap 8 5 - 15   Meds ordered this encounter  Medications   AND Linked Order Group    labetalol (NORMODYNE) injection 20 mg    labetalol (NORMODYNE) injection 40 mg    labetalol (NORMODYNE) injection 80 mg    hydrALAZINE (APRESOLINE) injection 10 mg   metoCLOPramide (REGLAN) injection 10 mg   diphenhydrAMINE (BENADRYL) injection 25 mg   dexamethasone (DECADRON) injection 10 mg   magnesium bolus via infusion 4 g   magnesium sulfate 40 grams in SWI 1000 mL OB infusion   betamethasone acetate-betamethasone sodium phosphate (CELESTONE) injection 12 mg   lactated ringers infusion   Assessment and Plan  --40 y.o. [id number] at [redacted]w[redacted]d  --CHTN with Superimposed Preeclampsia with Severe Features --BPP 6/8 (off for breathing) --Tracing initially  reactive --+ Cocaine --Magnesium Sulfate initiated in MAU --Betamethasone 1 of 2 given in MAU --Per Dr. Ilda Basset, admit to [facility], Dr. Barbaraann Rondo notified of patient admission  Darlina Rumpf, [place] 02/25/2021, 4:47 PM

## 2021-02-26 DIAGNOSIS — N179 Acute kidney failure, unspecified: Secondary | ICD-10-CM

## 2021-02-26 DIAGNOSIS — O365990 Maternal care for other known or suspected poor fetal growth, unspecified trimester, not applicable or unspecified: Secondary | ICD-10-CM

## 2021-02-26 DIAGNOSIS — O1413 Severe pre-eclampsia, third trimester: Secondary | ICD-10-CM | POA: Diagnosis not present

## 2021-02-26 DIAGNOSIS — O365930 Maternal care for other known or suspected poor fetal growth, third trimester, not applicable or unspecified: Secondary | ICD-10-CM | POA: Diagnosis not present

## 2021-02-26 HISTORY — DX: Acute kidney failure, unspecified: N17.9

## 2021-02-26 HISTORY — DX: Maternal care for other known or suspected poor fetal growth, unspecified trimester, not applicable or unspecified: O36.5990

## 2021-02-26 LAB — COMPREHENSIVE METABOLIC PANEL
ALT: 18 U/L (ref 0–44)
ALT: 18 U/L (ref 0–44)
AST: 21 U/L (ref 15–41)
AST: 17 U/L (ref 15–41)
Albumin: 2.6 g/dL — ABNORMAL LOW (ref 3.5–5.0)
Albumin: 2.7 g/dL — ABNORMAL LOW (ref 3.5–5.0)
Alkaline Phosphatase: 159 U/L — ABNORMAL HIGH (ref 38–126)
Alkaline Phosphatase: 167 U/L — ABNORMAL HIGH (ref 38–126)
Anion gap: 12 (ref 5–15)
Anion gap: 9 (ref 5–15)
BUN: 8 mg/dL (ref 6–20)
BUN: 8 mg/dL (ref 6–20)
CO2: 20 mmol/L — ABNORMAL LOW (ref 22–32)
CO2: 21 mmol/L — ABNORMAL LOW (ref 22–32)
Calcium: 7.5 mg/dL — ABNORMAL LOW (ref 8.9–10.3)
Calcium: 7.1 mg/dL — ABNORMAL LOW (ref 8.9–10.3)
Chloride: 97 mmol/L — ABNORMAL LOW (ref 98–111)
Chloride: 101 mmol/L (ref 98–111)
Creatinine, Ser: 1.04 mg/dL — ABNORMAL HIGH (ref 0.44–1.00)
Creatinine, Ser: 0.93 mg/dL (ref 0.44–1.00)
GFR, Estimated: >60 mL/min (ref >60)
GFR, Estimated: >60 mL/min (ref >60)
Glucose, Bld: 198 mg/dL — ABNORMAL HIGH (ref 70–99)
Glucose, Bld: 119 mg/dL — ABNORMAL HIGH (ref 70–99)
Potassium: 3.5 mmol/L (ref 3.5–5.1)
Potassium: 3.6 mmol/L (ref 3.5–5.1)
Sodium: 129 mmol/L — ABNORMAL LOW (ref 135–145)
Sodium: 131 mmol/L — ABNORMAL LOW (ref 135–145)
Total Bilirubin: 0.2 mg/dL — ABNORMAL LOW (ref 0.3–1.2)
Total Bilirubin: 0.2 mg/dL — ABNORMAL LOW (ref 0.3–1.2)
Total Protein: 5.9 g/dL — ABNORMAL LOW (ref 6.5–8.1)
Total Protein: 6.1 g/dL — ABNORMAL LOW (ref 6.5–8.1)

## 2021-02-26 LAB — CREATININE CLEARANCE, URINE, 24 HOUR
Collection Interval-CRCL: 24 hours
Creatinine Clearance: 90 mL/min (ref 75–115)
Creatinine, 24H Ur: 1210 mg/d (ref 600–1800)
Creatinine, Urine: 50.41 mg/dL
Urine Total Volume-CRCL: 2400 mL

## 2021-02-26 LAB — CBC
HCT: 33.7 % — ABNORMAL LOW (ref 36.0–46.0)
HCT: 34 % — ABNORMAL LOW (ref 36.0–46.0)
Hemoglobin: 11.6 g/dL — ABNORMAL LOW (ref 12.0–15.0)
Hemoglobin: 11.5 g/dL — ABNORMAL LOW (ref 12.0–15.0)
MCH: 31.1 pg (ref 26.0–34.0)
MCH: 30.8 pg (ref 26.0–34.0)
MCHC: 34.4 g/dL (ref 30.0–36.0)
MCHC: 33.8 g/dL (ref 30.0–36.0)
MCV: 90.3 fL (ref 80.0–100.0)
MCV: 91.2 fL (ref 80.0–100.0)
Platelets: 174 10*3/uL (ref 150–400)
Platelets: 170 10*3/uL (ref 150–400)
RBC: 3.73 MIL/uL — ABNORMAL LOW (ref 3.87–5.11)
RBC: 3.73 MIL/uL — ABNORMAL LOW (ref 3.87–5.11)
RDW: 13 % (ref 11.5–15.5)
RDW: 13.1 % (ref 11.5–15.5)
WBC: 7.2 10*3/uL (ref 4.0–10.5)
WBC: 11.4 10*3/uL — ABNORMAL HIGH (ref 4.0–10.5)
nRBC: 0 % (ref 0.0–0.2)
nRBC: 0 % (ref 0.0–0.2)

## 2021-02-26 LAB — PROTEIN, URINE, 24 HOUR
Collection Interval-UPROT: 24 hours
Protein, Urine: <6 mg/dL
Urine Total Volume-UPROT: 2400 mL

## 2021-02-26 LAB — GC/CHLAMYDIA PROBE AMP (~~LOC~~) NOT AT ARMC
Chlamydia: NEGATIVE
Comment: NEGATIVE
Comment: NORMAL
Neisseria Gonorrhea: NEGATIVE

## 2021-02-26 LAB — MAGNESIUM: Magnesium: 7.9 mg/dL (ref 1.7–2.4)

## 2021-02-26 MED ORDER — SODIUM CHLORIDE 0.9 % IV SOLN: INTRAVENOUS | Status: DC

## 2021-02-26 MED ORDER — OXYCODONE HCL 5 MG PO TABS
10.0000 mg | ORAL_TABLET | Freq: Once | ORAL | Status: AC
  Administered 2021-03-04: 10 mg via ORAL
  Filled 2021-02-26 (×3): qty 2

## 2021-02-26 MED ORDER — METOCLOPRAMIDE HCL 5 MG/ML IJ SOLN
10.0000 mg | Freq: Once | INTRAMUSCULAR | Status: AC
  Administered 2021-02-26: 10 mg via INTRAVENOUS
  Filled 2021-02-26: qty 2

## 2021-02-26 MED ORDER — SODIUM CHLORIDE 0.9 % IV SOLN: INTRAVENOUS | Status: AC

## 2021-02-26 NOTE — Progress Notes (Signed)
CSW acknowledged consult and completed chart review.  MOB is currently on Magnesium.  CSW will meet with MOB after Magnesium is discontinued.  Laurey Arrow, MSW, LCSW Clinical Social Work [phone]

## 2021-02-26 NOTE — Progress Notes (Signed)
Date and time results received: 02/26/21 1421 (use smartphrase ".now" to insert current time)  Test:  Magnesium Critical Value: 7.1  Name of Provider Notified: Dr. Ilda Basset  Orders Received? Or Actions Taken?: No new orders.  This level is therapeutic for patient's clinical situation.

## 2021-02-26 NOTE — Progress Notes (Signed)
BTL papers signed with patient  Durene Romans MD [facility]ending [facility] ([facility]) 02/26/2021 Time: [phone]

## 2021-02-26 NOTE — Consult Note (Signed)
Neonatology Consult to Antenatal Patient: 02/26/2021 5:56 PM    I was requested by Dr Donnel Saxon to see this patient in order to provide antenatal counseling due to  preeclampsia at 31 3/[redacted] weeks gestation.    Marissa Meyer is a  40 y/o G6P4 who was admitted yesterday and is now 59 3/[redacted] weeks GA.  Pregnaancy complicated by  cHTN, FGR, trichomoniasis, AMA and drug use including marijuana and cocaine.  She is currently "not" having active labor.  She received a course of BMZ (6/16 and 6/17), now off Magnesium Sulfate, and on labetalol and hydralazine.  I spoke with Marissa Meyer in Room 102.   We discussed in detail what to expect in case of possible delivery of the infant in the next few days including morbidity and mortality at this gestational age, usual delivery room resuscitation including intubation and surfactant administration in the DR.  Discussed possible respiratory complications and need for support including mechanical ventilation, IV access, sepsis work-up, NG/OG feedings ( benefits of BF and availability of DBM), risk for IVH with the potential for motor/cognitive deficits, length of stay and long-term outcome.  She is familiar of what to expect and remembers me well since I took care of her 2 older children in the NICU who is now 46 and 58 years old respectively.  Thank you for asking [place] to see this patient and allowing [place] to participate in her care.  Please call if there are any further questions.   Amedeo Gory, MD (Attending Neonatologist)  Total length of face-to-face or floor/unit time for this encounter was 40 minutes. Counseling and/or coordination of care was greater than fifty percent of the time above.

## 2021-02-26 NOTE — Progress Notes (Addendum)
Daily Antepartum Note  Admission Date: 02/25/2021 Current Date: 02/26/2021 9:22 AM  Marissa Meyer is a 40 y.o. [id number] @ [redacted]w[redacted]d, HD#2, admitted for severe pre-eclampsia superimposed on cHTN, based on severe range BPs with proteinuria.  Pregnancy complicated by: Patient Active Problem List   Diagnosis Date Noted   AKI (acute kidney injury) ([place]) 02/26/2021   IUGR (intrauterine growth restriction) affecting care of mother 02/26/2021   Severe pre-eclampsia, antepartum, third trimester 02/25/2021   Trichimoniasis 12/24/2020   History of trichomoniasis 12/21/2020   Multigravida of advanced maternal age in second trimester 12/21/2020   History of VBAC 12/21/2020   Prediabetes 12/21/2020   Supervision of high risk pregnancy, antepartum 10/09/2020   History of substance abuse ([place]) 10/09/2020   Chronic hypertension with exacerbation during pregnancy 09/19/2015   Cocaine abuse affecting pregnancy ([place]) 03/14/2014   Insufficient prenatal care 02/20/2014   History of preterm labor 02/20/2014   Tobacco use in pregnancy, antepartum 02/20/2014    Overnight/24hr events:  none  Subjective:  Pt states HA that went away feels like it may be coming back. No other s/s of pre-eclampsia and no labor s/s.   Objective:    Current Vital Signs 24h Vital Sign Ranges  T 98.2 F (36.8 C) Temp  Avg: 98.3 F (36.8 C)  Min: 97.6 F (36.4 C)  Max: 99 F (37.2 C)  BP (!) 144/88  BP  Min: 121/73  Max: 201/119  HR 96 Pulse  Avg: 95.8  Min: 87  Max: 104  RR 18 Resp  Avg: 17.4  Min: 16  Max: 18  SaO2 98 %  (Room Air) SpO2  Avg: 98.7 %  Min: 97 %  Max: 100 %       24 Hour I/O Current Shift I/O  Time Ins Outs 06/16 0701 - 06/17 0700 In: 2255.4 [P.O.:1920; I.V.:335.4] Out: 1350 [Urine:1350] 06/17 0701 - 06/17 1900 In: 1674.9 [P.O.:50; I.V.:1624.9] Out: 200 [Urine:200]   Patient Vitals for the past 24 hrs:  BP Temp Temp src Pulse Resp SpO2 Height  02/26/21 0726 (!) 144/88 98.2 F (36.8 C) Oral 96 18  98 % --  02/26/21 0350 (!) 147/89 97.6 F (36.4 C) Oral -- 18 98 % --  02/26/21 0000 128/75 -- -- 87 18 97 % --  02/25/21 2300 133/86 -- -- 94 18 100 % --  02/25/21 2200 (!) 141/81 98 F (36.7 C) Oral 90 18 99 % --  02/25/21 2115 -- -- -- -- -- 99 % --  02/25/21 2110 -- -- -- -- -- 99 % --  02/25/21 2105 -- -- -- -- -- 98 % --  02/25/21 2100 138/87 -- -- 93 18 100 % --  02/25/21 2000 (!) 143/86 98 F (36.7 C) Oral (!) 101 18 99 % --  02/25/21 1900 131/81 98.3 F (36.8 C) Oral 93 17 100 % --  02/25/21 1800 (!) 142/88 -- -- (!) 101 -- 100 % --  02/25/21 1700 123/77 -- -- 93 -- 99 % --  02/25/21 1654 121/73 -- Oral 92 17 99 % --  02/25/21 1649 -- -- -- -- 18 -- 4\' 9"  (1.448 m)  02/25/21 1648 -- -- -- 92 -- -- --  02/25/21 1647 -- 98.3 F (36.8 C) -- -- -- -- --  02/25/21 1614 136/77 99 F (37.2 C) Oral (!) 102 16 100 % --  02/25/21 1546 (!) 135/92 -- -- (!) 103 -- 100 % --  02/25/21 1534 (!) 155/85 98.5 F (36.9 C)  Oral 100 16 100 % --  02/25/21 1451 134/87 -- -- (!) 102 -- 97 % --  02/25/21 1441 135/86 -- -- 94 -- 98 % --  02/25/21 1431 124/81 -- -- 94 -- 98 % --  02/25/21 1421 (!) 144/90 -- -- 97 -- 98 % --  02/25/21 1411 121/83 -- -- (!) 102 -- 98 % --  02/25/21 1351 (!) 144/96 -- -- 92 -- 97 % --  02/25/21 1341 (!) 160/103 -- -- 93 -- 97 % --  02/25/21 1331 (!) 166/111 -- -- 90 -- 98 % --  02/25/21 1321 (!) 191/117 -- -- 95 -- -- --  02/25/21 1252 (!) 201/119 98.5 F (36.9 C) Oral (!) 104 16 100 % --   UOP: approximately 124mL/hr  Fetal Heart Tones: 125 baseline, no accel or decel, min to mod variability Tocometry: quiet  Physical exam: General: Well nourished, well developed female in no acute distress. Abdomen: gravid nttp Neuro: 1+ brachial Cardiovascular: S1, S2 normal, no murmur, rub or gallop, regular rate and rhythm Respiratory: CTAB Extremities: no clubbing, cyanosis or edema Skin: Warm and dry.   Medications: Current Facility-Administered Medications   Medication Dose Route Frequency Provider Last Rate Last Admin   acetaminophen (TYLENOL) tablet 650 mg  650 mg Oral Q4H PRN Aletha Halim, MD   650 mg at 02/26/21 0531   betamethasone acetate-betamethasone sodium phosphate (CELESTONE) injection 12 mg  12 mg Intramuscular Q24 Hr x 2 Weinhold, Samantha C, CNM   12 mg at 02/25/21 1617   calcium carbonate (TUMS - dosed in mg elemental calcium) chewable tablet 400 mg of elemental calcium  2 tablet Oral Q4H PRN Aletha Halim, MD       docusate sodium (COLACE) capsule 100 mg  100 mg Oral BID PRN Aletha Halim, MD       labetalol (NORMODYNE) injection 20 mg  20 mg Intravenous PRN Mallie Snooks C, CNM   20 mg at 02/25/21 1307   And   labetalol (NORMODYNE) injection 40 mg  40 mg Intravenous PRN Mallie Snooks C, CNM   40 mg at 02/25/21 1321   And   labetalol (NORMODYNE) injection 80 mg  80 mg Intravenous PRN Darlina Rumpf, CNM   80 mg at 02/25/21 1334   And   hydrALAZINE (APRESOLINE) injection 10 mg  10 mg Intravenous PRN Mallie Snooks C, CNM   10 mg at 02/25/21 1346   lactated ringers infusion   Intravenous Continuous Darlina Rumpf, CNM 75 mL/hr at 02/26/21 0531 New Bag at 02/26/21 0531   magnesium sulfate 40 grams in SWI 1000 mL OB infusion  2 g/hr Intravenous Continuous Darlina Rumpf, CNM 50 mL/hr at 02/26/21 0829 2 g/hr at 02/26/21 0829   NIFEdipine (PROCARDIA XL/NIFEDICAL XL) 24 hr tablet 60 mg  60 mg Oral BID Aletha Halim, MD   60 mg at 02/25/21 2150   oxyCODONE (Oxy IR/ROXICODONE) immediate release tablet 10 mg  10 mg Oral Once Aletha Halim, MD       pantoprazole (PROTONIX) EC tablet 40 mg  40 mg Oral Daily Aletha Halim, MD   40 mg at 02/25/21 1745   prenatal multivitamin tablet 1 tablet  1 tablet Oral Q1200 Aletha Halim, MD        Labs:  Recent Labs  Lab 02/25/21 1303 02/26/21 0506  WBC 9.3 7.2  HGB 13.5 11.6*  HCT 39.8 33.7*  PLT 165 174    Recent Labs  Lab 02/25/21 1303  02/26/21 0506  NA 134* 129*  K 3.5 3.5  CL 104 97*  CO2 22 20*  BUN <5* 8  CREATININE 0.66 1.04*  CALCIUM 8.6* 7.5*  PROT 6.4* 5.9*  BILITOT 0.3 0.2*  ALKPHOS 173* 159*  ALT 20 18  AST 30 21  GLUCOSE 81 198*   Pending: GBS, GC/CT  Negative: covid, hiv  Radiology:  6/16: elevated UA, ceph, afi 19, bpp 6/8 (breathing) 6/6: <1%, 989g, AC <1%, elevated UA  Assessment & Plan:  Pt stable *Pregnancy:category I tracing. Continue with continuous EFM until 2nd BMZ @ 1600 today. D/w her r/b/a and she desires BTL. Will sign papers with her later today *Severe pre-x w/ cHTN: doing well on home procardia and Mg. F/u rpt labs at noon. Good UOP and no e/o Mg toxicity. Will do Mg level with next lab draw. I told her to be NPO until noon labs are back in case Cr keeps increasing. Pt was on labetalol earlier in pregnancy but she stopped it b/c it gave her GERD *Preterm: BMZ #2 at 1600 today. Continue Mg for now. NICU to come by and see her today *FGR: repeat dopplers on Sunday and continue 2x/week.  *H/o VBAC: I d/w her r/b/a and she desires TOLAC. When it is time for delivery, I told her that as long as baby can tolerate labor then we can try for another VBAC *Social: +cocaine on admission and h/o polysubstance abuse and any child care issues. SW consulted *Trich: test of ure needed.  *PPx: SCDs, PPI *FEN/GI: regular diet, MIVF *Dispo: inpatient until delivery. At the latest, plan is for delivery 34wks.   Durene Romans MD Attending [facility] [place] [facility] GYN Consult Phone: [phone] (M-F, [phone]) & [phone] (Off hours, weekends, holidays)

## 2021-02-27 DIAGNOSIS — O1413 Severe pre-eclampsia, third trimester: Secondary | ICD-10-CM

## 2021-02-27 LAB — RPR: RPR Ser Ql: NONREACTIVE

## 2021-02-27 MED ORDER — LABETALOL HCL 200 MG PO TABS
200.0000 mg | ORAL_TABLET | Freq: Two times a day (BID) | ORAL | Status: DC
  Administered 2021-02-27 – 2021-03-01 (×5): 200 mg via ORAL
  Filled 2021-02-27 (×5): qty 1

## 2021-02-27 NOTE — Progress Notes (Signed)
FACULTY PRACTICE ANTEPARTUM(COMPREHENSIVE) NOTE  Marissa Meyer is a 40 y.o. [phone] at [redacted]w[redacted]d by best clinical estimate who is admitted for pre-eclampsia with severe features.   Fetal presentation is cephalic. Length of Stay:  2  Days  ASSESSMENT: Active Problems:   Insufficient prenatal care   Cocaine abuse affecting pregnancy (H[facility])   Severe pre-eclampsia, antepartum, third trimester   AKI (acute kidney injury) ([place])   IUGR (intrauterine growth restriction) affecting care of mother   PLAN: Add Labetalol to Procardia for BP control Weekly dopplers Daily NST Q 72 hour labs  Subjective: Feels ok, no complaints this am Patient reports the fetal movement as active. Patient reports uterine contraction  activity as none. Patient reports  vaginal bleeding as none. Patient describes fluid per vagina as None.  Vitals:  Blood pressure (!) 146/83, pulse 95, temperature 98.4 F (36.9 C), temperature source Oral, resp. rate 15, height 4\' 9"  (1.448 m), last menstrual period 08/02/2020, SpO2 98 %. Physical Examination:  General appearance - alert, well appearing, and in no distress Chest - normal effort Abdomen - gravid, non-tender Fundal Height:  size equals dates Extremities: Homans sign is negative, no sign of DVT  Membranes:intact  Fetal Monitoring:  Baseline: 130 bpm, Variability: Good {> 6 bpm), Accelerations: Reactive, and Decelerations: Absent  Labs:  CBC    Component Value Date/Time   WBC 11.4 (H) 02/26/2021 1144   RBC 3.73 (L) 02/26/2021 1144   HGB 11.5 (L) 02/26/2021 1144   HGB 11.9 10/09/2020 1139   HCT 34.0 (L) 02/26/2021 1144   HCT 34.2 10/09/2020 1139   PLT 170 02/26/2021 1144   PLT 210 10/09/2020 1139   MCV 91.2 02/26/2021 1144   MCV 92 10/09/2020 1139   MCH 30.8 02/26/2021 1144   MCHC 33.8 02/26/2021 1144   RDW 13.1 02/26/2021 1144   RDW 12.7 10/09/2020 1139   LYMPHSABS 1.8 10/09/2020 1139   MONOABS 0.6 04/11/2019 1640   EOSABS 0.1 10/09/2020 1139    BASOSABS 0.0 10/09/2020 1139   CMP Latest Ref Rng & Units 02/26/2021 02/26/2021 02/25/2021  Glucose 70 - 99 mg/dL 119(H) 198(H) 81  BUN 6 - 20 mg/dL 8 8 <5(L)  Creatinine 0.44 - 1.00 mg/dL 0.93 1.04(H) 0.66  Sodium 135 - 145 mmol/L 131(L) 129(L) 134(L)  Potassium 3.5 - 5.1 mmol/L 3.6 3.5 3.5  Chloride 98 - 111 mmol/L 101 97(L) 104  CO2 22 - 32 mmol/L 21(L) 20(L) 22  Calcium 8.9 - 10.3 mg/dL 7.1(L) 7.5(L) 8.6(L)  Total Protein 6.5 - 8.1 g/dL 6.1(L) 5.9(L) 6.4(L)  Total Bilirubin 0.3 - 1.2 mg/dL 0.2(L) 0.2(L) 0.3  Alkaline Phos 38 - 126 U/L 167(H) 159(H) 173(H)  AST 15 - 41 U/L 17 21 30   ALT 0 - 44 U/L 18 18 20      Imaging Studies:    Abnormal dopplers, BPP 8/10 on 6/16, normal AFI, vtx  Medications:  Scheduled  labetalol  200 mg Oral BID   NIFEdipine  60 mg Oral BID   oxyCODONE  10 mg Oral Once   pantoprazole  40 mg Oral Daily   prenatal multivitamin  1 tablet Oral Q1200   I have reviewed the patient's current medications.   Donnamae Jude, MD 02/27/2021,5:38 PM

## 2021-02-27 NOTE — Clinical Social Work Maternal (Signed)
CLINICAL SOCIAL WORK MATERNAL/CHILD NOTE  Patient Details  Name: Marissa Meyer MRN: [id number] Date of Birth: Oct 14, 1980  Date:  02/27/2021  Clinical Social Worker Initiating Note:  Darcus Austin, MSW, LCSWA Date/Time: Initiated:  02/27/21/1122     Child's Name:  pending   Biological Parents:  Mother, Father Lynnea Maizes, 01/01/90, [phone])   Need for Interpreter:  None   Reason for Referral:  Current Substance Use/Substance Use During Pregnancy  , Other (Comment) ([facility])   Address:  [address] [place] [address]    Phone number:  [phone]    Additional phone number: [phone] (MOB's mother), [phone] (FOB)   Household Members/Support Persons (HM/SP):   Household Member/Support Person 1, Household Member/Support Person 2, Household Member/Support Person 3, Household Member/Support Person 4 Clyda Greener, 09/19/15, Son- MOB's cousin Theodoro Clock has custody. Gabrielle Dare, 04/26/04, Daughter- MOB still has custody, but allows daughter to live with God Mother for better opportunities.)   HM/SP Name Relationship DOB or Age  HM/SP -1 Darion Deberry FOB 01/01/90  HM/SP -[place] FOB's mother    HM/SP -3 Cedric Whitney Post 10/31/09  HM/SP -4 Hettie Holstein Gilford Rile Son 03/23/14  HM/SP -5        HM/SP -6        HM/SP -7        HM/SP -8          Natural Supports (not living in the home):  Immediate Family, Spouse/significant other (FOB and sister. 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   Education:  Some Therapist, occupational arranged:    Museum/gallery curator Resources:  [employer]   Other Resources:  Arts development officer Considerations Which May Impact Care:    Strengths:  Pediatrician chosen   Psychotropic Medications:         Pediatrician:    [place] area  Pediatrician List:   [facility] for  [place]      Pediatrician Fax Number:    Risk Factors/Current Problems:  Substance Use  , Basic Needs  , Other (Comment) (Hx of DSS.)   Cognitive State:  Able to Concentrate  , Alert  , Insightful  , Linear Thinking     Mood/Affect:  Comfortable  , Interested  , Calm  , Happy  , Bright  , Relaxed     CSW Assessment: CSW met with MOB to complete consult for limited prenatal care and substance use during pregnancy. CSW observed MOB resting in bed.CSW explained role and reason for consult. MOB was pleasant, polite, and engaged with CSW. MOB reported, the reason for limited prenatal care was due to transportation issues. MOB reported, history of cocaine and her last use being Saturday 6/11. MOB reported, her reason for cocaine use was for pain. MOB denied any other illicit substances involvement.   MOB reported, history of DSS with her last infant Clyda Greener, 2017). MOB reported, the reason for DSS involvement was due to history of cocaine use and not being able to care for another child. MOB reported, at the time of Tyler's delivery, she wanted to give infant up for adoption via adoption agency. MOB reported, her cousin Theodoro Clock, decided to obtain custody of Dorothea Ogle, to allow him to still  be amongst family. MOB reported, she does not keep in contact with Dorothea Ogle nor [place], but does receive updates of both via Tamar's facebook account. MOB reported, Theodoro Clock and her decided to keep Tyler's adoption a secret from child. MOB reported, she still has custody of her oldest daughter Estephany Perot, and sees her whenever she wants. MOB reported, however, she allows Leah to live with her God Mother for better opportunities Darnelle [facility] [phone]).   CSW informed MOB of Drug Screen Policy and MOB was understanding of protocol. After delivery, CSW will continue to follow the UDS and CDS and will make CPS report if warranted.    CSW provided education regarding the baby blues period vs. perinatal mood disorders, discussed treatment and gave resources for mental health follow up if concerns arise. CSW recommends self- evaluation during the postpartum time period using the New Mom Checklist from Postpartum Progress and encouraged MOB to contact a medical professional if symptoms are noted at any time.   When CSW asked MOB of her emotions since admission. MOB reported, she feels, "depressed, because she would rather be at home". MOB reported, FOB and her sister are her supports. MOB reported, while she is hospitalized, her sister is caring for her household children. MOB denied SI, HI, and DV when CSW assessed for safety.   MOB reported, she receive food stamps. MOB reported, infant's pediatrician will be at [facility] for [place] and there may be barriers to follow up infant's care. CSW informed MOB of medicaid transportation, however, MOB was already familiar with the process. MOB reported, she does not have all essentials needed to care for infant. MOB reported, she was not prepared to be admitted until she deliver. MOB reported, infant has a car seat, but does not have anything to sleep in. MOB reported, the need for diapers, wipes, and pack & play. CSW informed MOB if available at d/c, [place] could possibly provide MOB with a pack & play.    CSW provided education on sudden infant death syndrome (SIDS).  After delivery, CSW will continue to follow the UDS and CDS and will make CPS report if warranted. CSW will continue to provide resources and offer ongoing support during admission.   CSW Plan/Description:  Psychosocial Support and Ongoing Assessment of Needs, Sudden Infant Death Syndrome (SIDS) Education, Perinatal Mood and Anxiety Disorder (PMADs) Education, [place], CSW Will Continue to Monitor Umbilical Cord Tissue Drug Screen Results and Make Report if Wilhemena Durie 02/27/2021, 12:51 PM  Darcus Austin, MSW, LCSW-A Clinical Social Worker [phone]

## 2021-02-28 ENCOUNTER — Inpatient Hospital Stay (HOSPITAL_BASED_OUTPATIENT_CLINIC_OR_DEPARTMENT_OTHER): Payer: Medicaid Other

## 2021-02-28 DIAGNOSIS — F1910 Other psychoactive substance abuse, uncomplicated: Secondary | ICD-10-CM

## 2021-02-28 DIAGNOSIS — O09213 Supervision of pregnancy with history of pre-term labor, third trimester: Secondary | ICD-10-CM

## 2021-02-28 DIAGNOSIS — O99323 Drug use complicating pregnancy, third trimester: Secondary | ICD-10-CM

## 2021-02-28 DIAGNOSIS — O365930 Maternal care for other known or suspected poor fetal growth, third trimester, not applicable or unspecified: Secondary | ICD-10-CM | POA: Diagnosis not present

## 2021-02-28 DIAGNOSIS — O09523 Supervision of elderly multigravida, third trimester: Secondary | ICD-10-CM | POA: Diagnosis not present

## 2021-02-28 DIAGNOSIS — O133 Gestational [pregnancy-induced] hypertension without significant proteinuria, third trimester: Secondary | ICD-10-CM

## 2021-02-28 DIAGNOSIS — O365910 Maternal care for other known or suspected poor fetal growth, first trimester, not applicable or unspecified: Secondary | ICD-10-CM

## 2021-02-28 DIAGNOSIS — O34219 Maternal care for unspecified type scar from previous cesarean delivery: Secondary | ICD-10-CM | POA: Diagnosis not present

## 2021-02-28 DIAGNOSIS — F17210 Nicotine dependence, cigarettes, uncomplicated: Secondary | ICD-10-CM

## 2021-02-28 DIAGNOSIS — O99333 Smoking (tobacco) complicating pregnancy, third trimester: Secondary | ICD-10-CM

## 2021-02-28 DIAGNOSIS — O10013 Pre-existing essential hypertension complicating pregnancy, third trimester: Secondary | ICD-10-CM

## 2021-02-28 DIAGNOSIS — Z3A31 31 weeks gestation of pregnancy: Secondary | ICD-10-CM

## 2021-02-28 DIAGNOSIS — O365990 Maternal care for other known or suspected poor fetal growth, unspecified trimester, not applicable or unspecified: Secondary | ICD-10-CM

## 2021-02-28 LAB — CBC
HCT: 32.5 % — ABNORMAL LOW (ref 36.0–46.0)
Hemoglobin: 10.8 g/dL — ABNORMAL LOW (ref 12.0–15.0)
MCH: 30.7 pg (ref 26.0–34.0)
MCHC: 33.2 g/dL (ref 30.0–36.0)
MCV: 92.3 fL (ref 80.0–100.0)
Platelets: 157 10*3/uL (ref 150–400)
RBC: 3.52 MIL/uL — ABNORMAL LOW (ref 3.87–5.11)
RDW: 13.3 % (ref 11.5–15.5)
WBC: 9.2 10*3/uL (ref 4.0–10.5)
nRBC: 0.3 % — ABNORMAL HIGH (ref 0.0–0.2)

## 2021-02-28 LAB — COMPREHENSIVE METABOLIC PANEL WITH GFR
ALT: 18 U/L (ref 0–44)
AST: 18 U/L (ref 15–41)
Albumin: 2.5 g/dL — ABNORMAL LOW (ref 3.5–5.0)
Alkaline Phosphatase: 147 U/L — ABNORMAL HIGH (ref 38–126)
Anion gap: 8 (ref 5–15)
BUN: 13 mg/dL (ref 6–20)
CO2: 23 mmol/L (ref 22–32)
Calcium: 8 mg/dL — ABNORMAL LOW (ref 8.9–10.3)
Chloride: 103 mmol/L (ref 98–111)
Creatinine, Ser: 0.82 mg/dL (ref 0.44–1.00)
GFR, Estimated: >60 mL/min (ref >60)
Glucose, Bld: 92 mg/dL (ref 70–99)
Potassium: 3.7 mmol/L (ref 3.5–5.1)
Sodium: 134 mmol/L — ABNORMAL LOW (ref 135–145)
Total Bilirubin: 0.4 mg/dL (ref 0.3–1.2)
Total Protein: 5.5 g/dL — ABNORMAL LOW (ref 6.5–8.1)

## 2021-02-28 LAB — TYPE AND SCREEN
ABO/RH(D): O POS
Antibody Screen: NEGATIVE

## 2021-02-28 NOTE — Progress Notes (Signed)
Patient ID: Marissa Meyer, female   DOB: Nov 12, 1980, 40 y.o.   MRN: [id number] [place]) NOTE  Marissa Meyer is a 40 y.o. [id number] with Estimated Date of Delivery: 04/27/21   By   [redacted]w[redacted]d  who is admitted for severe pre eclampsia, BP, headaches.    Fetal presentation is cephalic. Length of Stay:  3  Days  Date of admission:02/25/2021  Subjective: Mild headache this am Patient reports the fetal movement as active. Patient reports uterine contraction  activity as none. Patient reports  vaginal bleeding as none. Patient describes fluid per vagina as None.  Vitals:  Blood pressure 139/85, pulse 91, temperature 98.3 F (36.8 C), temperature source Oral, resp. rate 17, height 4\' 9"  (1.448 m), last menstrual period 08/02/2020, SpO2 99 %. Vitals:   02/27/21 1957 02/27/21 2216 02/28/21 0324 02/28/21 0705  BP: (!) 150/84 (!) 150/85 (!) 158/90 139/85  Pulse: (!) 102 95 91 91  Resp: 19 17 18 17   Temp: 98.4 F (36.9 C) 98.4 F (36.9 C) 97.8 F (36.6 C) 98.3 F (36.8 C)  TempSrc: Oral Oral Oral Oral  SpO2: 98% 96% 97% 99%  Height:       Physical Examination:  General appearance - alert, well appearing, and in no distress Abdomen - soft, nontender, nondistended, no masses or organomegaly Fundal Height:  size equals dates Pelvic Exam:  examination not indicated Cervical Exam: Not evaluated.. Extremities: extremities normal, atraumatic, no cyanosis or edema with DTRs 2+ bilaterally Membranes:intact  Fetal Monitoring:  Baseline: 160 bpm, Variability: Good {> 6 bpm), Accelerations: Reactive, and Decelerations: Absent   reactive  Labs:  No results found for this or any previous visit (from the past 24 hour(s)).  Imaging Studies:    [place] MFM Fetal BPP Wo Non Stress  Result Date: 02/25/2021 ----------------------------------------------------------------------  OBSTETRICS REPORT                       (Signed Final 02/25/2021 05:49 pm)  ---------------------------------------------------------------------- Patient Info  ID #:       [id number]                          D.O.B.:  December 22, 1980 (40 yrs)  Name:       Marissa Meyer                  Visit Date: 02/25/2021 02:38 pm ---------------------------------------------------------------------- Performed By  Attending:        Johnell Comings MD         Ref. Address:     [address]                                                             [place], [place]  Performed By:     Margie Billet,          [facility]                    [place]  Referred By:      [facility] ---------------------------------------------------------------------- Orders  #  Description                           Code        Ordered By  1  [place] MFM FETAL BPP WO NON               76819.01    [place]  2  [place] MFM UA CORD DOPPLER                11914.78    Mallie Snooks ----------------------------------------------------------------------  #  Order #                     Accession #                Episode #  1  [id number]                   [id number]                 [id number]  2  [id number]                   [id number]                 [id number] ---------------------------------------------------------------------- Indications  Maternal care for known or suspected poor      O36.5930  fetal growth, third trimester, not applicable or  unspecified IUGR  Hypertension - Chronic/Pre-existing -          O10.019  procardia  [redacted] weeks gestation of pregnancy                Z3A.72  Advanced maternal age multigravida 46+,        O58.523  third trimester (39)  History of cesarean delivery x2, currently     O34.219  pregnant  History of pre-term deliveries (34, 35 weeks)   O09.219  Substance abuse affecting pregnancy,           O99.320 F19.10  antepartum (THC)  Tobacco use complicating pregnancy, third      O99.333  trimester ---------------------------------------------------------------------- Fetal Evaluation  Num Of Fetuses:         1  Fetal Heart Rate(bpm):  144  Cardiac Activity:       Observed  Presentation:  Cephalic  Placenta:               Posterior  Amniotic Fluid  AFI FV:      Within normal limits  AFI Sum(cm)     %Tile       Largest Pocket(cm)  19.2            73          8.1  RUQ(cm)       RLQ(cm)       LUQ(cm)        LLQ(cm)  8.1           5.7           1.6            3.8 ---------------------------------------------------------------------- Biophysical Evaluation  Amniotic F.V:   Within normal limits       F. Tone:        Observed  F. Movement:    Observed                   Score:          6/8  F. Breathing:   Not Observed ---------------------------------------------------------------------- OB History  Gravidity:    6         Term:   0        Prem:   4        SAB:   1  Living:       3 ---------------------------------------------------------------------- Gestational Age  LMP:           29w 4d        Date:  08/02/20                 EDD:   05/09/21  Best:          Burke Keels 2d     Det. By:  U/S  (12/14/20)          EDD:   04/27/21 ---------------------------------------------------------------------- Anatomy  Thoracic:              Appears normal         Kidneys:                Appear normal  Diaphragm:             Appears normal         Bladder:                Appears normal  Stomach:               Appears normal, left                         sided ---------------------------------------------------------------------- Doppler - Fetal Vessels  Umbilical Artery   S/D     %tile      RI    %tile      PI    %tile            ADFV    RDFV   4.13       97    0.76       95    1.29       96               No      No  ---------------------------------------------------------------------- Cervix Uterus Adnexa  Cervix  Not visualized (advanced GA >24wks) ---------------------------------------------------------------------- Comments  This patient was seen for a biophysical profile due to chronic  hypertension with superimposed severe preeclampsia.  Fetal  growth restriction was noted on her last exam.  A biophysical profile performed today was 6 out of 8.  She  received a -2 for fetal breathing movements that did not meet  criteria.  She should have an NST performed once she is  admitted.  There was normal amniotic fluid noted on today's ultrasound  exam.  Doppler studies of the umbilical arteries performed today  showed an elevated S/D ratio of 4.13.  There were no signs  of absent or reversed end-diastolic flow noted. ----------------------------------------------------------------------                   Johnell Comings, MD Electronically Signed Final Report   02/25/2021 05:49 pm ----------------------------------------------------------------------  [place] MFM UA CORD DOPPLER  Result Date: 02/25/2021 ----------------------------------------------------------------------  OBSTETRICS REPORT                       (Signed Final 02/25/2021 05:49 pm) ---------------------------------------------------------------------- Patient Info  ID #:       [id number]                          D.O.B.:  [birth year] (39 yrs)  Name:       Marissa Meyer                  Visit Date: 02/25/2021 02:38 pm ---------------------------------------------------------------------- Performed By  Attending:        Johnell Comings MD         Ref. Address:     [address]                                                             [place], [place]  Performed By:     Margie Billet,          Location[facility]                    [place]  Referred By:       [facility] ---------------------------------------------------------------------- Orders  #  Description                           Code        Ordered By  1  [place] MFM FETAL BPP WO NON               [id number]    [place]  [place]  2  [place] MFM UA CORD DOPPLER                10932.35    Mallie Snooks ----------------------------------------------------------------------  #  Order #                     Accession #                Episode #  1  [id number]                   [id number]                 [id number]  2  [id number]                   [id number]                 [id number] ---------------------------------------------------------------------- Indications  Maternal care for known or suspected poor      O36.5930  fetal growth, third trimester, not applicable or  unspecified IUGR  Hypertension - Chronic/Pre-existing -          O10.019  procardia  [redacted] weeks gestation of pregnancy                Z3A.72  Advanced maternal age multigravida 52+,        O68.523  third trimester (39)  History of cesarean delivery x2, currently     O34.219  pregnant  History of pre-term deliveries (34, 39 weeks)  O09.219  Substance abuse affecting pregnancy,           O99.320 F19.10  antepartum (THC)  Tobacco use complicating pregnancy, third      O99.333  trimester ---------------------------------------------------------------------- Fetal Evaluation  Num Of Fetuses:         1  Fetal Heart Rate(bpm):  144  Cardiac Activity:       Observed  Presentation:           Cephalic  Placenta:               Posterior  Amniotic Fluid  AFI FV:      Within normal limits  AFI Sum(cm)     %Tile       Largest Pocket(cm)  19.2            73          8.1  RUQ(cm)       RLQ(cm)       LUQ(cm)        LLQ(cm)  8.1           5.7           1.6            3.8 ----------------------------------------------------------------------  Biophysical Evaluation  Amniotic F.V:   Within normal limits       F. Tone:        Observed  F. Movement:    Observed                   Score:          6/8  F. Breathing:   Not Observed ---------------------------------------------------------------------- OB  History  Gravidity:    6         Term:   0        Prem:   4        SAB:   1  Living:       3 ---------------------------------------------------------------------- Gestational Age  LMP:           29w 4d        Date:  08/02/20                 EDD:   05/09/21  Best:          Burke Keels 2d     Det. By:  U/S  (12/14/20)          EDD:   04/27/21 ---------------------------------------------------------------------- Anatomy  Thoracic:              Appears normal         Kidneys:                Appear normal  Diaphragm:             Appears normal         Bladder:                Appears normal  Stomach:               Appears normal, left                         sided ---------------------------------------------------------------------- Doppler - Fetal Vessels  Umbilical Artery   S/D     %tile      RI    %tile      PI    %tile            ADFV    RDFV   4.13       97    0.76       95    1.29       96               No      No ---------------------------------------------------------------------- Cervix Uterus Adnexa  Cervix  Not visualized (advanced GA >24wks) ---------------------------------------------------------------------- Comments  This patient was seen for a biophysical profile due to chronic  hypertension with superimposed severe preeclampsia.  Fetal  growth restriction was noted on her last exam.  A biophysical profile performed today was 6 out of 8.  She  received a -2 for fetal breathing movements that did not meet  criteria.  She should have an NST performed once she is  admitted.  There was normal amniotic fluid noted on today's ultrasound  exam.  Doppler studies of the umbilical arteries performed today  showed an elevated S/D ratio of 4.13.  There were no signs   of absent or reversed end-diastolic flow noted. ----------------------------------------------------------------------                   Johnell Comings, MD Electronically Signed Final Report   02/25/2021 05:49 pm ----------------------------------------------------------------------    Medications:  Scheduled  labetalol  200 mg Oral BID   NIFEdipine  60 mg Oral BID   oxyCODONE  10 mg Oral Once   pantoprazole  40 mg Oral Daily   prenatal multivitamin  1 tablet Oral Q1200   I have reviewed the patient's current medications.  ASSESSMENT: [id number] [redacted]w[redacted]d Estimated Date of Delivery: 04/27/21  Patient Active Problem List   Diagnosis Date Noted  AKI (acute kidney injury) ([place]) 02/26/2021   IUGR (intrauterine growth restriction) affecting care of mother 02/26/2021   Severe pre-eclampsia, antepartum, third trimester 02/25/2021   Trichimoniasis 12/24/2020   History of trichomoniasis 12/21/2020   Multigravida of advanced maternal age in second trimester 12/21/2020   History of VBAC 12/21/2020   Prediabetes 12/21/2020   Supervision of high risk pregnancy, antepartum 10/09/2020   History of substance abuse ([place]) 10/09/2020   Chronic hypertension with exacerbation during pregnancy 09/19/2015   Cocaine abuse affecting pregnancy ([place]) 03/14/2014   Insufficient prenatal care 02/20/2014   History of preterm labor 02/20/2014   Tobacco use in pregnancy, antepartum 02/20/2014    PLAN: Continue in house observation S/P BMZ and magneisum BPP tomoroow  Mertie Clause Willian Donson 02/28/2021,9:15 AM

## 2021-03-01 ENCOUNTER — Ambulatory Visit: Payer: Medicaid Other

## 2021-03-01 ENCOUNTER — Other Ambulatory Visit: Payer: Self-pay

## 2021-03-01 MED ORDER — LABETALOL HCL 200 MG PO TABS
200.0000 mg | ORAL_TABLET | Freq: Once | ORAL | Status: AC
  Administered 2021-03-01: 200 mg via ORAL
  Filled 2021-03-01: qty 1

## 2021-03-01 MED ORDER — LABETALOL HCL 200 MG PO TABS
400.0000 mg | ORAL_TABLET | Freq: Two times a day (BID) | ORAL | Status: DC
  Administered 2021-03-01: 400 mg via ORAL
  Filled 2021-03-01: qty 2

## 2021-03-01 NOTE — Progress Notes (Signed)
Patient ID: Marissa Meyer, female   DOB: October 05, 1980, 40 y.o.   MRN: [id number] [place]) NOTE  Marissa Meyer is a 40 y.o. [phone] with Estimated Date of Delivery: 04/27/21   By  midtrimester ultrasound [redacted]w[redacted]d  who is admitted for pre eclampsia with severe features, H/A and BP.    Fetal presentation is cephalic. Length of Stay:  4  Days  Date of admission:02/25/2021  Subjective: No headache this am Patient reports the fetal movement as active. Patient reports uterine contraction  activity as none. Patient reports  vaginal bleeding as none. Patient describes fluid per vagina as None.  Vitals:  Blood pressure (!) 149/90, pulse (!) 106, temperature 97.7 F (36.5 C), resp. rate 16, height 4\' 9"  (1.448 m), last menstrual period 08/02/2020, SpO2 100 %. Vitals:   02/28/21 1918 03/01/21 0037 03/01/21 0437 03/01/21 0734  BP: (!) 146/69 (!) 152/86 (!) 148/81 (!) 149/90  Pulse: 94 97 (!) 108 (!) 106  Resp: 18 18 16    Temp: 98.9 F (37.2 C) 98 F (36.7 C) 97.9 F (36.6 C) 97.7 F (36.5 C)  TempSrc: Oral Oral Oral   SpO2: 100% 99% 100% 100%  Height:       Physical Examination:  General appearance - alert, well appearing, and in no distress Abdomen - soft, nontender, nondistended, no masses or organomegaly Fundal Height:  size equals dates Pelvic Exam:  examination not indicated Cervical Exam: Not evaluated. . Extremities: extremities normal, atraumatic, no cyanosis or edema with DTRs 2+ bilaterally Membranes:intact  Fetal Monitoring:  Baseline: 140s bpm, Variability: Good {> 6 bpm), Accelerations: Reactive, and Decelerations: Absent   reactive  Labs:  Results for orders placed or performed during the hospital encounter of 02/25/21 (from the past 24 hour(s))  CBC   Collection Time: 02/28/21  9:33 AM  Result Value Ref Range   WBC 9.2 4.0 - 10.5 K/uL   RBC 3.52 (L) 3.87 - 5.11 MIL/uL   Hemoglobin 10.8 (L) 12.0 - 15.0 g/dL   HCT 32.5 (L) 36.0 - 46.0 %    MCV 92.3 80.0 - 100.0 fL   MCH 30.7 26.0 - 34.0 pg   MCHC 33.2 30.0 - 36.0 g/dL   RDW 13.3 11.5 - 15.5 %   Platelets 157 150 - 400 K/uL   nRBC 0.3 (H) 0.0 - 0.2 %  Comprehensive metabolic panel   Collection Time: 02/28/21  9:33 AM  Result Value Ref Range   Sodium 134 (L) 135 - 145 mmol/L   Potassium 3.7 3.5 - 5.1 mmol/L   Chloride 103 98 - 111 mmol/L   CO2 23 22 - 32 mmol/L   Glucose, Bld 92 70 - 99 mg/dL   BUN 13 6 - 20 mg/dL   Creatinine, Ser 0.82 0.44 - 1.00 mg/dL   Calcium 8.0 (L) 8.9 - 10.3 mg/dL   Total Protein 5.5 (L) 6.5 - 8.1 g/dL   Albumin 2.5 (L) 3.5 - 5.0 g/dL   AST 18 15 - 41 U/L   ALT 18 0 - 44 U/L   Alkaline Phosphatase 147 (H) 38 - 126 U/L   Total Bilirubin 0.4 0.3 - 1.2 mg/dL   GFR, Estimated >60 >60 mL/min   Anion gap 8 5 - 15  Type and screen [place]   Collection Time: 02/28/21  3:44 PM  Result Value Ref Range   ABO/RH(D) O POS    Antibody Screen NEG    Sample Expiration      03/03/2021,2359  Performed at [facility], [place] [address], [place], [place] [address]     Imaging Studies:    [place] MFM FETAL BPP WO NON STRESS  Result Date: 02/28/2021 ----------------------------------------------------------------------  OBSTETRICS REPORT                       (Signed Final 02/28/2021 09:55 pm) ---------------------------------------------------------------------- Patient Info  ID #:       [id number]                          D.O.B.:  [birth year] (40 yrs)  Name:       Marissa Meyer                  Visit Date: 02/28/2021 12:54 pm ---------------------------------------------------------------------- Performed By  Attending:        Johnell Comings MD         Ref. Address:      [address]                                                              [place], [place]  Performed By:     Wilnette Kales        Location[facility]          [facility]                                   [place]  Referred By:      [facility] ---------------------------------------------------------------------- Orders  #  Description                           Code        Ordered By  1  [place] MFM FETAL BPP WO NON               76819.01    CHARLIE PICKENS     STRESS  2  [place] MFM UA CORD DOPPLER                00938.18    CHARLIE PICKENS ----------------------------------------------------------------------  #  Order #                     Accession #                Episode #  1  [id number]                   [id number]  [id number]  2  [id number]                   [id number]                 [id number] ---------------------------------------------------------------------- Indications  Maternal care for known or suspected poor       O36.5930  fetal growth, third trimester, not applicable or  unspecified IUGR  Hypertension - Chronic/Pre-existing -           O10.019  procardia  Advanced maternal age multigravida 84+,         O34.523  third trimester (39)  History of cesarean delivery x2, currently      O34.219  pregnant  History of pre-term deliveries (34, 35 weeks)   O09.219  Substance abuse affecting pregnancy,            O99.320 F19.10  antepartum (THC)  Tobacco use complicating pregnancy, third       O99.333  trimester  [redacted] weeks gestation of pregnancy                 Z3A.31 ---------------------------------------------------------------------- Fetal Evaluation  Num Of Fetuses:          1  Fetal Heart Rate(bpm):   138  Cardiac Activity:        Observed  Presentation:            Cephalic  Placenta:                Posterior  P. Cord Insertion:       Previously Visualized  Amniotic Fluid  AFI FV:      Within normal limits  AFI Sum(cm)     %Tile       Largest Pocket(cm)  15.74           56          4.82  RUQ(cm)       RLQ(cm)       LUQ(cm)        LLQ(cm)  4.82          3.07          3.5            4.35  ---------------------------------------------------------------------- Biophysical Evaluation  Amniotic F.V:   Within normal limits       F. Tone:         Observed  F. Movement:    Observed                   Score:           8/8  F. Breathing:   Observed ---------------------------------------------------------------------- OB History  Gravidity:    6         Term:   0        Prem:   4        SAB:   1  Living:       3 ---------------------------------------------------------------------- Gestational Age  LMP:           30w 0d        Date:  08/02/20                 EDD:   05/09/21  Best:          31w 5d     Det. By:  U/S  (12/14/20)          EDD:   04/27/21 ---------------------------------------------------------------------- Anatomy  Cranium:  Appears normal         Abdomen:                Appears normal  Cavum:                 Appears normal         Abdominal Wall:         Appears nml (cord                                                                        insert, abd wall)  Ventricles:            Appears normal         Cord Vessels:           Appears normal (3                                                                        vessel cord)  Cerebellum:            Appears normal         Kidneys:                Appear normal  Posterior Fossa:       Appears normal         Bladder:                Appears normal  Stomach:               Appears normal, left                         sided  Other:  Technicallly difficult due to advanced GA and maternal habitus. ---------------------------------------------------------------------- Doppler - Fetal Vessels  Umbilical Artery    S/D    %tile      RI    %tile      PI    %tile            ADFV    RDFV   4.01       96    0.75       95    1.33   > 97.5               No      No ---------------------------------------------------------------------- Comments  This patient has been hospitalized due to severe  preeeclampsia and IUGR.  A biophysical profile performed  today was 8 out of 8.  There was normal amniotic fluid noted on today's ultrasound  exam.  Doppler studies of the umbilical arteries performed due to  fetal growth restriction showed ann elevated S/D ratio of 4.01.  There were no signs of absent or reversed end-diastolic flow  noted today. ----------------------------------------------------------------------                   Johnell Comings, MD Electronically Signed Final Report   02/28/2021 09:55 pm ----------------------------------------------------------------------  [place] MFM Fetal  BPP Wo Non Stress  Result Date: 02/25/2021 ----------------------------------------------------------------------  OBSTETRICS REPORT                       (Signed Final 02/25/2021 05:49 pm) ---------------------------------------------------------------------- Patient Info  ID #:       [id number]                          D.O.B.:  02/23/1981 (39 yrs)  Name:       Marissa Meyer                  Visit Date: 02/25/2021 02:38 pm ---------------------------------------------------------------------- Performed By  Attending:        Johnell Comings MD         Ref. Address:     [address]                                                             [place], [place]  Performed By:     Margie Billet,          Location:         [facility]  Referred By:      [facility] ---------------------------------------------------------------------- Orders  #  Description                           Code        Ordered By  1  [place] MFM FETAL BPP WO NON               76819.01    [place]  2  [place] MFM UA CORD DOPPLER                24268.34    Mallie Snooks ----------------------------------------------------------------------  #   Order #  Accession #                Episode #  1  [id number]                   [id number]                 [id number]  2  [id number]                   [id number]                 [id number] ---------------------------------------------------------------------- Indications  Maternal care for known or suspected poor      O36.5930  fetal growth, third trimester, not applicable or  unspecified IUGR  Hypertension - Chronic/Pre-existing -          O10.019  procardia  [redacted] weeks gestation of pregnancy                Z3A.75  Advanced maternal age multigravida 96+,        O66.523  third trimester (39)  History of cesarean delivery x2, currently     O34.219  pregnant  History of pre-term deliveries (34, 19 weeks)  O09.219  Substance abuse affecting pregnancy,           O99.320 F19.10  antepartum (THC)  Tobacco use complicating pregnancy, third      O99.333  trimester ---------------------------------------------------------------------- Fetal Evaluation  Num Of Fetuses:         1  Fetal Heart Rate(bpm):  144  Cardiac Activity:       Observed  Presentation:           Cephalic  Placenta:               Posterior  Amniotic Fluid  AFI FV:      Within normal limits  AFI Sum(cm)     %Tile       Largest Pocket(cm)  19.2            73          8.1  RUQ(cm)       RLQ(cm)       LUQ(cm)        LLQ(cm)  8.1           5.7           1.6            3.8 ---------------------------------------------------------------------- Biophysical Evaluation  Amniotic F.V:   Within normal limits       F. Tone:        Observed  F. Movement:    Observed                   Score:          6/8  F. Breathing:   Not Observed ---------------------------------------------------------------------- OB History  Gravidity:    6         Term:   0        Prem:   4        SAB:   1  Living:       3 ---------------------------------------------------------------------- Gestational Age  LMP:           29w 4d        Date:  08/02/20                 EDD:   05/09/21   Best:          Burke Keels 2d  Det. By:  U/S  (12/14/20)          EDD:   04/27/21 ---------------------------------------------------------------------- Anatomy  Thoracic:              Appears normal         Kidneys:                Appear normal  Diaphragm:             Appears normal         Bladder:                Appears normal  Stomach:               Appears normal, left                         sided ---------------------------------------------------------------------- Doppler - Fetal Vessels  Umbilical Artery   S/D     %tile      RI    %tile      PI    %tile            ADFV    RDFV   4.13       97    0.76       95    1.29       96               No      No ---------------------------------------------------------------------- Cervix Uterus Adnexa  Cervix  Not visualized (advanced GA >24wks) ---------------------------------------------------------------------- Comments  This patient was seen for a biophysical profile due to chronic  hypertension with superimposed severe preeclampsia.  Fetal  growth restriction was noted on her last exam.  A biophysical profile performed today was 6 out of 8.  She  received a -2 for fetal breathing movements that did not meet  criteria.  She should have an NST performed once she is  admitted.  There was normal amniotic fluid noted on today's ultrasound  exam.  Doppler studies of the umbilical arteries performed today  showed an elevated S/D ratio of 4.13.  There were no signs  of absent or reversed end-diastolic flow noted. ----------------------------------------------------------------------                   Johnell Comings, MD Electronically Signed Final Report   02/25/2021 05:49 pm ----------------------------------------------------------------------  [place] MFM UA CORD DOPPLER  Result Date: 02/28/2021 ----------------------------------------------------------------------  OBSTETRICS REPORT                       (Signed Final 02/28/2021 09:55 pm)  ---------------------------------------------------------------------- Patient Info  ID #:       [id number]                          D.O.B.:  1980-11-13 (39 yrs)  Name:       Marissa Meyer                  Visit Date: 02/28/2021 12:54 pm ---------------------------------------------------------------------- Performed By  Attending:        Johnell Comings MD         Ref. Address:      [address]  [place], [place]  Performed By:     Wilnette Kales        [facility]  Referred By:      [facility] ---------------------------------------------------------------------- Orders  #  Description                           Code        Ordered By  1  [place] MFM FETAL BPP WO NON               76819.01    CHARLIE PICKENS     STRESS  2  [place] MFM UA CORD DOPPLER                [id number]    CHARLIE PICKENS ----------------------------------------------------------------------  #  Order #                     Accession #                Episode #  1  [id number]                   [id number]                 [id number]  2  [id number]                   [id number]                 [id number] ---------------------------------------------------------------------- Indications  Maternal care for known or suspected poor       O36.5930  fetal growth, third trimester, not applicable or  unspecified IUGR  Hypertension - Chronic/Pre-existing -           O10.019  procardia  Advanced maternal age multigravida 47+,         O47.523  third trimester (39)  History of cesarean delivery x2, currently      O34.219  pregnant  History of pre-term deliveries (34, 35 weeks)   O09.219  Substance abuse affecting pregnancy,            O99.320 F19.10  antepartum (THC)  Tobacco use complicating pregnancy, third       O99.333   trimester  [redacted] weeks gestation of pregnancy                 Z3A.31 ---------------------------------------------------------------------- Fetal Evaluation  Num Of Fetuses:          1  Fetal Heart Rate(bpm):   138  Cardiac Activity:        Observed  Presentation:  Cephalic  Placenta:                Posterior  P. Cord Insertion:       Previously Visualized  Amniotic Fluid  AFI FV:      Within normal limits  AFI Sum(cm)     %Tile       Largest Pocket(cm)  15.74           56          4.82  RUQ(cm)       RLQ(cm)       LUQ(cm)        LLQ(cm)  4.82          3.07          3.5            4.35 ---------------------------------------------------------------------- Biophysical Evaluation  Amniotic F.V:   Within normal limits       F. Tone:         Observed  F. Movement:    Observed                   Score:           8/8  F. Breathing:   Observed ---------------------------------------------------------------------- OB History  Gravidity:    6         Term:   0        Prem:   4        SAB:   1  Living:       3 ---------------------------------------------------------------------- Gestational Age  LMP:           30w 0d        Date:  08/02/20                 EDD:   05/09/21  Best:          31w 5d     Det. By:  U/S  (12/14/20)          EDD:   04/27/21 ---------------------------------------------------------------------- Anatomy  Cranium:               Appears normal         Abdomen:                Appears normal  Cavum:                 Appears normal         Abdominal Wall:         Appears nml (cord                                                                        insert, abd wall)  Ventricles:            Appears normal         Cord Vessels:           Appears normal (3  vessel cord)  Cerebellum:            Appears normal         Kidneys:                Appear normal  Posterior Fossa:       Appears normal         Bladder:                Appears  normal  Stomach:               Appears normal, left                         sided  Other:  Technicallly difficult due to advanced GA and maternal habitus. ---------------------------------------------------------------------- Doppler - Fetal Vessels  Umbilical Artery    S/D    %tile      RI    %tile      PI    %tile            ADFV    RDFV   4.01       96    0.75       95    1.33   > 97.5               No      No ---------------------------------------------------------------------- Comments  This patient has been hospitalized due to severe  preeeclampsia and IUGR.  A biophysical profile performed today was 8 out of 8.  There was normal amniotic fluid noted on today's ultrasound  exam.  Doppler studies of the umbilical arteries performed due to  fetal growth restriction showed ann elevated S/D ratio of 4.01.  There were no signs of absent or reversed end-diastolic flow  noted today. ----------------------------------------------------------------------                   Johnell Comings, MD Electronically Signed Final Report   02/28/2021 09:55 pm ----------------------------------------------------------------------  [place] MFM UA CORD DOPPLER  Result Date: 02/25/2021 ----------------------------------------------------------------------  OBSTETRICS REPORT                       (Signed Final 02/25/2021 05:49 pm) ---------------------------------------------------------------------- Patient Info  ID #:       [id number]                          D.O.B.:  September 11, 1981 (39 yrs)  Name:       Marissa Meyer                  Visit Date: 02/25/2021 02:38 pm ---------------------------------------------------------------------- Performed By  Attending:        Johnell Comings MD         Ref. Address:     [address]                                                             [place], [place]  [address]  Performed By:     Margie Billet,          Location:         [facility]  Referred By:      [facility] ---------------------------------------------------------------------- Orders  #  Description                           Code        Ordered By  1  [place] MFM FETAL BPP WO NON               76819.01    [place]  2  [place] MFM UA CORD DOPPLER                56812.75    Mallie Snooks ----------------------------------------------------------------------  #  Order #                     Accession #                Episode #  1  [id number]                   [id number]                 [id number]  2  [id number]                   [id number]                 [id number] ---------------------------------------------------------------------- Indications  Maternal care for known or suspected poor      O36.5930  fetal growth, third trimester, not applicable or  unspecified IUGR  Hypertension - Chronic/Pre-existing -          O10.019  procardia  [redacted] weeks gestation of pregnancy                Z3A.7  Advanced maternal age multigravida 29+,        O62.523  third trimester (39)  History of cesarean delivery x2, currently     O34.219  pregnant  History of pre-term deliveries (34, 35 weeks)  O09.219  Substance abuse affecting pregnancy,           O99.320 F19.10  antepartum (THC)  Tobacco use complicating pregnancy, third      O99.333  trimester ---------------------------------------------------------------------- Fetal Evaluation  Num Of Fetuses:  1  Fetal Heart Rate(bpm):  144  Cardiac Activity:       Observed  Presentation:           Cephalic  Placenta:               Posterior  Amniotic Fluid  AFI FV:      Within normal limits  AFI Sum(cm)     %Tile       Largest Pocket(cm)  19.2            73          8.1  RUQ(cm)       RLQ(cm)       LUQ(cm)        LLQ(cm)  8.1           5.7           1.6             3.8 ---------------------------------------------------------------------- Biophysical Evaluation  Amniotic F.V:   Within normal limits       F. Tone:        Observed  F. Movement:    Observed                   Score:          6/8  F. Breathing:   Not Observed ---------------------------------------------------------------------- OB History  Gravidity:    6         Term:   0        Prem:   4        SAB:   1  Living:       3 ---------------------------------------------------------------------- Gestational Age  LMP:           29w 4d        Date:  08/02/20                 EDD:   05/09/21  Best:          Burke Keels 2d     Det. By:  U/S  (12/14/20)          EDD:   04/27/21 ---------------------------------------------------------------------- Anatomy  Thoracic:              Appears normal         Kidneys:                Appear normal  Diaphragm:             Appears normal         Bladder:                Appears normal  Stomach:               Appears normal, left                         sided ---------------------------------------------------------------------- Doppler - Fetal Vessels  Umbilical Artery   S/D     %tile      RI    %tile      PI    %tile            ADFV    RDFV   4.13       97    0.76       95    1.29       96               No  No ---------------------------------------------------------------------- Cervix Uterus Adnexa  Cervix  Not visualized (advanced GA >24wks) ---------------------------------------------------------------------- Comments  This patient was seen for a biophysical profile due to chronic  hypertension with superimposed severe preeclampsia.  Fetal  growth restriction was noted on her last exam.  A biophysical profile performed today was 6 out of 8.  She  received a -2 for fetal breathing movements that did not meet  criteria.  She should have an NST performed once she is  admitted.  There was normal amniotic fluid noted on today's ultrasound  exam.  Doppler studies of the umbilical  arteries performed today  showed an elevated S/D ratio of 4.13.  There were no signs  of absent or reversed end-diastolic flow noted. ----------------------------------------------------------------------                   Johnell Comings, MD Electronically Signed Final Report   02/25/2021 05:49 pm ----------------------------------------------------------------------    Medications:  Scheduled  labetalol  200 mg Oral BID   NIFEdipine  60 mg Oral BID   oxyCODONE  10 mg Oral Once   pantoprazole  40 mg Oral Daily   prenatal multivitamin  1 tablet Oral Q1200   I have reviewed the patient's current medications.  ASSESSMENT: [id number] [redacted]w[redacted]d Estimated Date of Delivery: 04/27/21  Patient Active Problem List   Diagnosis Date Noted   AKI (acute kidney injury) ([place]) 02/26/2021   IUGR (intrauterine growth restriction) affecting care of mother 02/26/2021   Severe pre-eclampsia, antepartum, third trimester 02/25/2021   Trichimoniasis 12/24/2020   History of trichomoniasis 12/21/2020   Multigravida of advanced maternal age in second trimester 12/21/2020   History of VBAC 12/21/2020   Prediabetes 12/21/2020   Supervision of high risk pregnancy, antepartum 10/09/2020   History of substance abuse ([place]) 10/09/2020   Chronic hypertension with exacerbation during pregnancy 09/19/2015   Cocaine abuse affecting pregnancy ([place]) 03/14/2014   Insufficient prenatal care 02/20/2014   History of preterm labor 02/20/2014   Tobacco use in pregnancy, antepartum 02/20/2014    PLAN: Continue labetalol 200 BID and procardia 60 BID Continue in house observation, deliver per clinical course or 34 weeks S/P BMZ/Mg S/P NICU consult   Florian Buff 03/01/2021,9:12 AM

## 2021-03-02 DIAGNOSIS — O1413 Severe pre-eclampsia, third trimester: Secondary | ICD-10-CM | POA: Diagnosis not present

## 2021-03-02 DIAGNOSIS — Z3A32 32 weeks gestation of pregnancy: Secondary | ICD-10-CM | POA: Diagnosis not present

## 2021-03-02 MED ORDER — HYDRALAZINE HCL 20 MG/ML IJ SOLN: 5.0000 mg | INTRAMUSCULAR | Status: DC | PRN

## 2021-03-02 MED ORDER — LABETALOL HCL 5 MG/ML IV SOLN
20.0000 mg | INTRAVENOUS | Status: DC | PRN
  Administered 2021-03-04: 20 mg via INTRAVENOUS

## 2021-03-02 MED ORDER — LACTATED RINGERS IV BOLUS
500.0000 mL | Freq: Once | INTRAVENOUS | Status: AC
  Administered 2021-03-02: 500 mL via INTRAVENOUS

## 2021-03-02 MED ORDER — LABETALOL HCL 5 MG/ML IV SOLN
40.0000 mg | INTRAVENOUS | Status: DC | PRN
  Administered 2021-03-04: 40 mg via INTRAVENOUS

## 2021-03-02 MED ORDER — LABETALOL HCL 200 MG PO TABS
600.0000 mg | ORAL_TABLET | Freq: Two times a day (BID) | ORAL | Status: DC
  Administered 2021-03-02 (×2): 600 mg via ORAL
  Filled 2021-03-02 (×2): qty 3

## 2021-03-02 MED ORDER — HYDRALAZINE HCL 20 MG/ML IJ SOLN: 10.0000 mg | INTRAMUSCULAR | Status: DC | PRN

## 2021-03-02 MED ORDER — LACTATED RINGERS IV SOLN: INTRAVENOUS | Status: DC

## 2021-03-02 NOTE — Progress Notes (Signed)
Patient ID: Marissa Meyer, female   DOB: 1980-10-15, 40 y.o.   MRN: [id number] [place]) NOTE  Marissa Meyer is a 40 y.o. [id number] at [redacted]w[redacted]d  who is admitted for severe preeclampsia.    Fetal presentation is cephalic. Length of Stay:  5  Days  Date of admission:02/25/2021  Subjective: Patient denies any headaches, visual symptoms, RUQ/epigastric pain or other concerning symptoms. Patient reports the fetal movement as active. Patient reports uterine contraction  activity as none. Patient reports  vaginal bleeding as none. Patient describes fluid per vagina as None.  Vitals:  Blood pressure (!) 149/94, pulse 100, temperature 98 F (36.7 C), temperature source Oral, resp. rate 20, height 4\' 9"  (1.448 m), last menstrual period 08/02/2020, SpO2 100 %. Vitals:   03/01/21 1558 03/01/21 2010 03/01/21 2318 03/02/21 0422  BP: (!) 147/98 (!) 155/99 (!) 152/92 (!) 149/94  Pulse: (!) 104 (!) 101 98 100  Resp: 17 19 19 20   Temp: 98.6 F (37 C) 98.1 F (36.7 C) 97.7 F (36.5 C) 98 F (36.7 C)  TempSrc: Oral Oral Oral Oral  SpO2: 100% 100% 100% 100%  Height:       Physical Examination: General appearance - alert, well appearing, and in no distress Abdomen - soft, nontender, nondistended, no masses or organomegaly Fundal Height:  size equals dates Pelvic Exam:  examination not indicated Cervical Exam: Not evaluated. . Extremities: extremities normal, atraumatic, no cyanosis or edema with DTRs 2+ bilaterally Membranes:intact   Fetal Monitoring:  Baseline: 140s bpm, Variability: Good {> 6 bpm), Accelerations: Reactive, and Decelerations: Absent   reactive  Labs:  No results found for this or any previous visit (from the past 24 hour(s)).  Imaging Studies:    [place] MFM FETAL BPP WO NON STRESS  Result Date: 02/28/2021 ----------------------------------------------------------------------  OBSTETRICS REPORT                       (Signed Final 02/28/2021 09:55 pm)  ---------------------------------------------------------------------- Patient Info  ID #:       [id number]                          D.O.B.:  [birth year] (39 yrs)  Name:       Marissa Meyer                  Visit Date: 02/28/2021 12:54 pm ---------------------------------------------------------------------- Performed By  Attending:        Johnell Comings MD         Ref. Address:      [address]                                                              [employer], [place]  Performed By:     Wilnette Kales        [facility]                                  [place]  Referred By:      [facility] ---------------------------------------------------------------------- Orders  #  Description                           Code        Ordered By  1  [place] MFM FETAL BPP WO NON               76819.01    [place]  2  [place] MFM UA CORD DOPPLER                [id number]    CHARLIE PICKENS ----------------------------------------------------------------------  #  Order #                     Accession #                Episode #  1  [id number]                   [id number]                 [id number]  2  [id number]                   [id number]                 [id number] ---------------------------------------------------------------------- Indications  Maternal care for known or suspected poor       O36.5930  fetal growth, third trimester, not applicable or  unspecified IUGR  Hypertension - Chronic/Pre-existing -           O10.019  procardia  Advanced maternal age multigravida 39+,         O43.523  third trimester (39)  History of cesarean delivery x2, currently      O34.219  pregnant  History of pre-term deliveries (34, 35 weeks)   O09.219  Substance abuse affecting pregnancy,            O99.320 F19.10  antepartum ([facility]C)  Tobacco use complicating pregnancy, third       O99.333   trimester  [redacted] weeks gestation of pregnancy                 Z3A.31 ---------------------------------------------------------------------- Fetal Evaluation  Num Of Fetuses:          1  Fetal Heart Rate(bpm):   138  Cardiac Activity:        Observed  Presentation:            Cephalic  Placenta:                Posterior  P. Cord Insertion:       Previously Visualized  Amniotic Fluid  AFI FV:      Within normal limits  AFI Sum(cm)     %Tile       Largest Pocket(cm)  15.74           56  4.82  RUQ(cm)       RLQ(cm)       LUQ(cm)        LLQ(cm)  4.82          3.07          3.5            4.35 ---------------------------------------------------------------------- Biophysical Evaluation  Amniotic F.V:   Within normal limits       F. Tone:         Observed  F. Movement:    Observed                   Score:           8/8  F. Breathing:   Observed ---------------------------------------------------------------------- OB History  Gravidity:    6         Term:   0        Prem:   4        SAB:   1  Living:       3 ---------------------------------------------------------------------- Gestational Age  LMP:           30w 0d        Date:  08/02/20                 EDD:   05/09/21  Best:          31w 5d     Det. By:  U/S  (12/14/20)          EDD:   04/27/21 ---------------------------------------------------------------------- Anatomy  Cranium:               Appears normal         Abdomen:                Appears normal  Cavum:                 Appears normal         Abdominal Wall:         Appears nml (cord                                                                        insert, abd wall)  Ventricles:            Appears normal         Cord Vessels:           Appears normal (3                                                                        vessel cord)  Cerebellum:            Appears normal         Kidneys:                Appear normal  Posterior Fossa:       Appears normal         Bladder:  Appears  normal  Stomach:               Appears normal, left                         sided  Other:  Technicallly difficult due to advanced GA and maternal habitus. ---------------------------------------------------------------------- Doppler - Fetal Vessels  Umbilical Artery    S/D    %tile      RI    %tile      PI    %tile            ADFV    RDFV   4.01       96    0.75       95    1.33   > 97.5               No      No ---------------------------------------------------------------------- Comments  This patient has been hospitalized due to severe  preeeclampsia and IUGR.  A biophysical profile performed today was 8 out of 8.  There was normal amniotic fluid noted on today's ultrasound  exam.  Doppler studies of the umbilical arteries performed due to  fetal growth restriction showed ann elevated S/D ratio of 4.01.  There were no signs of absent or reversed end-diastolic flow  noted today. ----------------------------------------------------------------------                   Johnell Comings, MD Electronically Signed Final Report   02/28/2021 09:55 pm ----------------------------------------------------------------------  [place] MFM UA CORD DOPPLER  Result Date: 02/28/2021 ----------------------------------------------------------------------  OBSTETRICS REPORT                       (Signed Final 02/28/2021 09:55 pm) ---------------------------------------------------------------------- Patient Info  ID #:       [id number]                          D.O.B.:  1980-12-08 (39 yrs)  Name:       Marissa Meyer                  Visit Date: 02/28/2021 12:54 pm ---------------------------------------------------------------------- Performed By  Attending:        Johnell Comings MD         Ref. Address:      [address]                                                              [place], [place]  Performed By:     Wilnette Kales        Location[facility]  [place]  Referred By:      [facility] ---------------------------------------------------------------------- Orders  #  Description                           Code        Ordered By  1  [place] MFM FETAL BPP WO NON               76819.01    CHARLIE PICKENS     STRESS  2  [place] MFM UA CORD DOPPLER                [id number]    CHARLIE PICKENS ----------------------------------------------------------------------  #  Order #                     Accession #                Episode #  1  [id number]                   [id number]                 [id number]  2  [id number]                   [id number]                 [id number] ---------------------------------------------------------------------- Indications  Maternal care for known or suspected poor       O36.5930  fetal growth, third trimester, not applicable or  unspecified IUGR  Hypertension - Chronic/Pre-existing -           O10.019  procardia  Advanced maternal age multigravida 40+,         O24.523  third trimester (39)  History of cesarean delivery x2, currently      O34.219  pregnant  History of pre-term deliveries (34, 35 weeks)   O09.219  Substance abuse affecting pregnancy,            O99.320 F19.10  antepartum ([facility]C)  Tobacco use complicating pregnancy, third       O99.333  trimester  [redacted] weeks gestation of pregnancy                 Z3A.31 ---------------------------------------------------------------------- Fetal Evaluation  Num Of Fetuses:          1  Fetal Heart Rate(bpm):   138  Cardiac Activity:        Observed  Presentation:            Cephalic  Placenta:                Posterior  P. Cord Insertion:       Previously Visualized  Amniotic Fluid  AFI FV:      Within normal limits  AFI Sum(cm)     %Tile       Largest Pocket(cm)  15.74           56          4.82  RUQ(cm)       RLQ(cm)       LUQ(cm)        LLQ(cm)  4.82          3.07          3.5  4.35  ---------------------------------------------------------------------- Biophysical Evaluation  Amniotic F.V:   Within normal limits       F. Tone:         Observed  F. Movement:    Observed                   Score:           8/8  F. Breathing:   Observed ---------------------------------------------------------------------- OB History  Gravidity:    6         Term:   0        Prem:   4        SAB:   1  Living:       3 ---------------------------------------------------------------------- Gestational Age  LMP:           30w 0d        Date:  08/02/20                 EDD:   05/09/21  Best:          31w 5d     Det. By:  U/S  (12/14/20)          EDD:   04/27/21 ---------------------------------------------------------------------- Anatomy  Cranium:               Appears normal         Abdomen:                Appears normal  Cavum:                 Appears normal         Abdominal Wall:         Appears nml (cord                                                                        insert, abd wall)  Ventricles:            Appears normal         Cord Vessels:           Appears normal (3                                                                        vessel cord)  Cerebellum:            Appears normal         Kidneys:                Appear normal  Posterior Fossa:       Appears normal         Bladder:                Appears normal  Stomach:               Appears normal, left  sided  Other:  Technicallly difficult due to advanced GA and maternal habitus. ---------------------------------------------------------------------- Doppler - Fetal Vessels  Umbilical Artery    S/D    %tile      RI    %tile      PI    %tile            ADFV    RDFV   4.01       96    0.75       95    1.33   > 97.5               No      No ---------------------------------------------------------------------- Comments  This patient has been hospitalized due to severe  preeeclampsia and IUGR.  A biophysical profile performed  today was 8 out of 8.  There was normal amniotic fluid noted on today's ultrasound  exam.  Doppler studies of the umbilical arteries performed due to  fetal growth restriction showed ann elevated S/D ratio of 4.01.  There were no signs of absent or reversed end-diastolic flow  noted today. ----------------------------------------------------------------------                   Johnell Comings, MD Electronically Signed Final Report   02/28/2021 09:55 pm ----------------------------------------------------------------------    Medications:  Scheduled  labetalol  600 mg Oral BID   NIFEdipine  60 mg Oral BID   oxyCODONE  10 mg Oral Once   pantoprazole  40 mg Oral Daily   prenatal multivitamin  1 tablet Oral Q1200   I have reviewed the patient's current medications.  ASSESSMENT: [id number] [redacted]w[redacted]d Estimated Date of Delivery: 04/27/21  Patient Active Problem List   Diagnosis Date Noted   AKI (acute kidney injury) ([place]) 02/26/2021   IUGR (intrauterine growth restriction) affecting care of mother 02/26/2021   Severe pre-eclampsia, antepartum, third trimester 02/25/2021   Trichimoniasis 12/24/2020   History of trichomoniasis 12/21/2020   Multigravida of advanced maternal age in second trimester 12/21/2020   History of VBAC 12/21/2020   Prediabetes 12/21/2020   Supervision of high risk pregnancy, antepartum 10/09/2020   History of substance abuse ([place]) 10/09/2020   Chronic hypertension with exacerbation during pregnancy 09/19/2015   Cocaine abuse affecting pregnancy ([place]) 03/14/2014   Insufficient prenatal care 02/20/2014   History of preterm labor 02/20/2014   Tobacco use in pregnancy, antepartum 02/20/2014    PLAN: Increased labetalol to 600 BID and procardia 60 BID Continue in house observation, deliver per clinical course or 34 weeks S/P BMZ/Mg S/P NICU consult  Florian Buff, MD 03/02/2021,8:00 AM   Addendum: On 03/03/21 morning, this note was not signed yet and Dr.Eure is off for the rest  of the week. I signed the note to file it to chart accordingly/   Verita Schneiders, MD

## 2021-03-03 ENCOUNTER — Inpatient Hospital Stay (HOSPITAL_BASED_OUTPATIENT_CLINIC_OR_DEPARTMENT_OTHER): Payer: Medicaid Other

## 2021-03-03 ENCOUNTER — Inpatient Hospital Stay (HOSPITAL_COMMUNITY): Payer: Medicaid Other

## 2021-03-03 DIAGNOSIS — O09523 Supervision of elderly multigravida, third trimester: Secondary | ICD-10-CM

## 2021-03-03 DIAGNOSIS — O34219 Maternal care for unspecified type scar from previous cesarean delivery: Secondary | ICD-10-CM

## 2021-03-03 DIAGNOSIS — Z3A32 32 weeks gestation of pregnancy: Secondary | ICD-10-CM

## 2021-03-03 DIAGNOSIS — O365930 Maternal care for other known or suspected poor fetal growth, third trimester, not applicable or unspecified: Secondary | ICD-10-CM

## 2021-03-03 DIAGNOSIS — O99333 Smoking (tobacco) complicating pregnancy, third trimester: Secondary | ICD-10-CM

## 2021-03-03 DIAGNOSIS — O1413 Severe pre-eclampsia, third trimester: Secondary | ICD-10-CM | POA: Diagnosis not present

## 2021-03-03 DIAGNOSIS — F17210 Nicotine dependence, cigarettes, uncomplicated: Secondary | ICD-10-CM

## 2021-03-03 DIAGNOSIS — O09213 Supervision of pregnancy with history of pre-term labor, third trimester: Secondary | ICD-10-CM

## 2021-03-03 DIAGNOSIS — O10013 Pre-existing essential hypertension complicating pregnancy, third trimester: Secondary | ICD-10-CM

## 2021-03-03 DIAGNOSIS — F1910 Other psychoactive substance abuse, uncomplicated: Secondary | ICD-10-CM

## 2021-03-03 DIAGNOSIS — O99323 Drug use complicating pregnancy, third trimester: Secondary | ICD-10-CM

## 2021-03-03 LAB — COMPREHENSIVE METABOLIC PANEL
ALT: 15 U/L (ref 0–44)
AST: 18 U/L (ref 15–41)
Albumin: 2.1 g/dL — ABNORMAL LOW (ref 3.5–5.0)
Alkaline Phosphatase: 138 U/L — ABNORMAL HIGH (ref 38–126)
Anion gap: 5 (ref 5–15)
BUN: 15 mg/dL (ref 6–20)
CO2: 21 mmol/L — ABNORMAL LOW (ref 22–32)
Calcium: 7.7 mg/dL — ABNORMAL LOW (ref 8.9–10.3)
Chloride: 108 mmol/L (ref 98–111)
Creatinine, Ser: 0.81 mg/dL (ref 0.44–1.00)
GFR, Estimated: >60 mL/min (ref >60)
Glucose, Bld: 80 mg/dL (ref 70–99)
Potassium: 4 mmol/L (ref 3.5–5.1)
Sodium: 134 mmol/L — ABNORMAL LOW (ref 135–145)
Total Bilirubin: 0.4 mg/dL (ref 0.3–1.2)
Total Protein: 4.6 g/dL — ABNORMAL LOW (ref 6.5–8.1)

## 2021-03-03 LAB — CBC
HCT: 30.8 % — ABNORMAL LOW (ref 36.0–46.0)
Hemoglobin: 10.5 g/dL — ABNORMAL LOW (ref 12.0–15.0)
MCH: 30.8 pg (ref 26.0–34.0)
MCHC: 34.1 g/dL (ref 30.0–36.0)
MCV: 90.3 fL (ref 80.0–100.0)
Platelets: 164 10*3/uL (ref 150–400)
RBC: 3.41 MIL/uL — ABNORMAL LOW (ref 3.87–5.11)
RDW: 13 % (ref 11.5–15.5)
WBC: 8.7 10*3/uL (ref 4.0–10.5)
nRBC: 0.5 % — ABNORMAL HIGH (ref 0.0–0.2)

## 2021-03-03 LAB — TYPE AND SCREEN
ABO/RH(D): O POS
Antibody Screen: NEGATIVE

## 2021-03-03 MED ORDER — ONDANSETRON HCL 4 MG/2ML IJ SOLN
4.0000 mg | Freq: Four times a day (QID) | INTRAMUSCULAR | Status: DC | PRN
  Administered 2021-03-03 – 2021-03-04 (×2): 4 mg via INTRAVENOUS
  Filled 2021-03-03: qty 2

## 2021-03-03 MED ORDER — LABETALOL HCL 200 MG PO TABS
600.0000 mg | ORAL_TABLET | Freq: Three times a day (TID) | ORAL | Status: DC
  Administered 2021-03-03 – 2021-03-04 (×4): 600 mg via ORAL
  Filled 2021-03-03 (×4): qty 3

## 2021-03-03 MED ORDER — ONDANSETRON HCL 4 MG/2ML IJ SOLN
INTRAMUSCULAR | Status: AC
  Filled 2021-03-03: qty 2

## 2021-03-03 NOTE — Progress Notes (Addendum)
Patient ID: Marissa Meyer, female   DOB: [birth year], 40 y.o.   MRN: [id number] [place]) NOTE  Marissa Meyer is a 40 y.o. [phone] at [redacted]w[redacted]d  who is admitted for severe preeclampsia.    Fetal presentation is cephalic. Length of Stay:  6  Days  Date of admission:02/25/2021  Subjective: She had severe BPs yesterday requiring doses of IV  antihypertensive.   Patient denies any headaches, visual symptoms, RUQ/epigastric pain or other concerning symptoms. Patient reports the fetal movement as active. Patient reports uterine contraction  activity as none. Patient reports  vaginal bleeding as none. Patient describes fluid per vagina as none.  Vitals:  Blood pressure 139/87, pulse 97, temperature 98.2 F (36.8 C), temperature source Oral, resp. rate 18, height 4\' 9"  (1.448 m), last menstrual period 08/02/2020, SpO2 97 %. Patient Vitals for the past 24 hrs:  BP Temp Temp src Pulse Resp SpO2  03/03/21 0735 139/87 98.2 F (36.8 C) Oral 97 18 97 %  03/03/21 0530 133/88 98 F (36.7 C) Oral 97 18 --  03/03/21 0112 (!) 143/93 98 F (36.7 C) Oral 95 18 --  03/02/21 2226 (!) 155/90 -- -- 98 18 --  03/02/21 2114 (!) 161/94 -- -- 98 -- --  03/02/21 2113 -- -- -- 98 -- --  03/02/21 2110 -- -- -- -- -- 100 %  03/02/21 2105 -- -- -- -- -- 100 %  03/02/21 2100 -- -- -- -- -- 100 %  03/02/21 2055 -- -- -- -- -- 100 %  03/02/21 2050 -- -- -- -- -- 100 %  03/02/21 2045 -- -- -- -- -- 100 %  03/02/21 2040 -- -- -- -- -- 100 %  03/02/21 2035 -- -- -- -- -- 100 %  03/02/21 2030 -- -- -- -- -- 100 %  03/02/21 2025 -- -- -- -- -- 99 %  03/02/21 2022 (!) 157/93 -- -- 97 -- --  03/02/21 2020 -- -- -- -- -- 98 %  03/02/21 1950 (!) 160/87 -- -- (!) 106 18 --  03/02/21 1940 (!) 157/90 -- -- 99 -- --  03/02/21 1930 (!) 153/88 -- -- (!) 101 -- --  03/02/21 1920 (!) 148/91 -- -- (!) 101 -- --  03/02/21 1910 (!) 156/89 -- -- 97 -- --  03/02/21 1900 (!) 152/89 -- -- 99 -- --   03/02/21 1850 (!) 155/93 -- -- (!) 101 -- --  03/02/21 1840 (!) 163/95 -- -- (!) 101 -- --  03/02/21 1830 (!) 177/102 -- -- 98 -- --  03/02/21 1820 (!) 163/97 -- -- 98 -- --  03/02/21 1810 (!) 163/102 -- -- 99 -- --  03/02/21 1800 (!) 175/107 -- -- 98 -- --  03/02/21 1750 (!) 178/105 -- -- 96 -- --  03/02/21 1740 (!) 166/109 -- -- 100 -- --  03/02/21 1715 (!) 161/98 -- -- 96 -- --  03/02/21 1659 (!) 175/98 98 F (36.7 C) Oral (!) 102 18 100 %  03/02/21 1241 (!) 156/91 (!) 97.5 F (36.4 C) Oral 96 18 98 %    Physical Examination: General appearance: alert, well appearing, and in no distress Abdomen: soft, nontender, nondistended, no masses or organomegaly Fundal Height:  size equals dates Pelvic Exam:  examination not indicated Cervical Exam: Not evaluated. . Extremities: extremities normal, atraumatic, no cyanosis or edema with DTRs 2+ bilaterally Membranes:intact   Fetal Monitoring:  Baseline: 140s bpm, Variability: Moderate {> 6 bpm), Accelerations: 10 x 10,  and Decelerations: Absent >> reactive  Labs:  CBC Latest Ref Rng & Units 03/03/2021 02/28/2021 02/26/2021  WBC 4.0 - 10.5 K/uL 8.7 9.2 11.4(H)  Hemoglobin 12.0 - 15.0 g/dL 10.5(L) 10.8(L) 11.5(L)  Hematocrit 36.0 - 46.0 % 30.8(L) 32.5(L) 34.0(L)  Platelets 150 - 400 K/uL 164 157 170   CMP Latest Ref Rng & Units 03/03/2021 02/28/2021 02/26/2021  Glucose 70 - 99 mg/dL 80 92 119(H)  BUN 6 - 20 mg/dL 15 13 8   Creatinine 0.44 - 1.00 mg/dL 0.81 0.82 0.93  Sodium 135 - 145 mmol/L 134(L) 134(L) 131(L)  Potassium 3.5 - 5.1 mmol/L 4.0 3.7 3.6  Chloride 98 - 111 mmol/L 108 103 101  CO2 22 - 32 mmol/L 21(L) 23 21(L)  Calcium 8.9 - 10.3 mg/dL 7.7(L) 8.0(L) 7.1(L)  Total Protein 6.5 - 8.1 g/dL 4.6(L) 5.5(L) 6.1(L)  Total Bilirubin 0.3 - 1.2 mg/dL 0.4 0.4 0.2(L)  Alkaline Phos 38 - 126 U/L 138(H) 147(H) 167(H)  AST 15 - 41 U/L 18 18 17   ALT 0 - 44 U/L 15 18 18     Imaging Studies:    [place] MFM FETAL BPP WO NON STRESS  Result Date:  02/28/2021 ----------------------------------------------------------------------  OBSTETRICS REPORT                       (Signed Final 02/28/2021 09:55 pm) ---------------------------------------------------------------------- Patient Info  ID #:       [id number]                          D.O.B.:  Marissa Meyer (40 yrs)  Name:       Marissa Meyer                  Visit Date: 02/28/2021 12:54 pm ---------------------------------------------------------------------- Performed By  Attending:        Johnell Comings MD         Ref. Address:      [address]                                                              [place], [place]  Performed By:     Wilnette Kales        [facility]  Referred By:      [facility] ---------------------------------------------------------------------- Orders  #  Description  Code        Ordered By  1  [place] MFM FETAL BPP WO NON               [id number]    CHARLIE PICKENS     STRESS  2  [place] MFM UA CORD DOPPLER                [id number]    CHARLIE PICKENS ----------------------------------------------------------------------  #  Order #                     Accession #                Episode #  1  [id number]                   [id number]                 [id number]  2  [id number]                   [id number]                 [id number] ---------------------------------------------------------------------- Indications  Maternal care for known or suspected poor       O36.5930  fetal growth, third trimester, not applicable or  unspecified IUGR  Hypertension - Chronic/Pre-existing -           O10.019  procardia  Advanced maternal age multigravida 64+,         O61.523  third trimester (39)  History of cesarean delivery x2, currently      O34.219  pregnant  History of pre-term deliveries (34, 35  weeks)   O09.219  Substance abuse affecting pregnancy,            O99.320 F19.10  antepartum (THC)  Tobacco use complicating pregnancy, third       O99.333  trimester  [redacted] weeks gestation of pregnancy                 Z3A.31 ---------------------------------------------------------------------- Fetal Evaluation  Num Of Fetuses:          1  Fetal Heart Rate(bpm):   138  Cardiac Activity:        Observed  Presentation:            Cephalic  Placenta:                Posterior  P. Cord Insertion:       Previously Visualized  Amniotic Fluid  AFI FV:      Within normal limits  AFI Sum(cm)     %Tile       Largest Pocket(cm)  15.74           56          4.82  RUQ(cm)       RLQ(cm)       LUQ(cm)        LLQ(cm)  4.82          3.07          3.5            4.35 ---------------------------------------------------------------------- Biophysical Evaluation  Amniotic F.V:   Within normal limits       F. Tone:         Observed  F. Movement:    Observed                   Score:  8/8  F. Breathing:   Observed ---------------------------------------------------------------------- OB History  Gravidity:    6         Term:   0        Prem:   4        SAB:   1  Living:       3 ---------------------------------------------------------------------- Gestational Age  LMP:           30w 0d        Date:  08/02/20                 EDD:   05/09/21  Best:          31w 5d     Det. By:  U/S  (12/14/20)          EDD:   04/27/21 ---------------------------------------------------------------------- Anatomy  Cranium:               Appears normal         Abdomen:                Appears normal  Cavum:                 Appears normal         Abdominal Wall:         Appears nml (cord                                                                        insert, abd wall)  Ventricles:            Appears normal         Cord Vessels:           Appears normal (3                                                                        vessel cord)   Cerebellum:            Appears normal         Kidneys:                Appear normal  Posterior Fossa:       Appears normal         Bladder:                Appears normal  Stomach:               Appears normal, left                         sided  Other:  Technicallly difficult due to advanced GA and maternal habitus. ---------------------------------------------------------------------- Doppler - Fetal Vessels  Umbilical Artery    S/D    %tile      RI    %tile      PI    %tile            ADFV  RDFV   4.01       96    0.75       95    1.33   > 97.5               No      No ---------------------------------------------------------------------- Comments  This patient has been hospitalized due to severe  preeeclampsia and IUGR.  A biophysical profile performed today was 8 out of 8.  There was normal amniotic fluid noted on today's ultrasound  exam.  Doppler studies of the umbilical arteries performed due to  fetal growth restriction showed ann elevated S/D ratio of 4.01.  There were no signs of absent or reversed end-diastolic flow  noted today. ----------------------------------------------------------------------                   Johnell Comings, MD Electronically Signed Final Report   02/28/2021 09:55 pm ----------------------------------------------------------------------  [place] MFM UA CORD DOPPLER  Result Date: 02/28/2021 ----------------------------------------------------------------------  OBSTETRICS REPORT                       (Signed Final 02/28/2021 09:55 pm) ---------------------------------------------------------------------- Patient Info  ID #:       [id number]                          D.O.B.:  [birth year] Meyer (39 yrs)  Name:       Marissa Meyer                  Visit Date: 02/28/2021 12:54 pm ---------------------------------------------------------------------- Performed By  Attending:        Johnell Comings MD         Ref. Address:      [address]                                                               [place], [place]  Performed By:     Wilnette Kales        [facility]  Referred By:      [facility] ---------------------------------------------------------------------- Orders  #  Description                           Code        Ordered By  1  [place] MFM FETAL BPP WO NON  44034.74    [place]  2  [place] MFM UA CORD DOPPLER                76820.02    CHARLIE PICKENS ----------------------------------------------------------------------  #  Order #                     Accession #                Episode #  1  [id number]                   [id number]                 [id number]  2  [id number]                   [id number]                 [id number] ---------------------------------------------------------------------- Indications  Maternal care for known or suspected poor       O36.5930  fetal growth, third trimester, not applicable or  unspecified IUGR  Hypertension - Chronic/Pre-existing -           O10.019  procardia  Advanced maternal age multigravida 37+,         O67.523  third trimester (39)  History of cesarean delivery x2, currently      O34.219  pregnant  History of pre-term deliveries (34, 35 weeks)   O09.219  Substance abuse affecting pregnancy,            O99.320 F19.10  antepartum (THC)  Tobacco use complicating pregnancy, third       O99.333  trimester  [redacted] weeks gestation of pregnancy                 Z3A.31 ---------------------------------------------------------------------- Fetal Evaluation  Num Of Fetuses:          1  Fetal Heart Rate(bpm):   138  Cardiac Activity:        Observed  Presentation:            Cephalic  Placenta:                Posterior  P. Cord Insertion:       Previously Visualized  Amniotic Fluid  AFI FV:      Within normal limits  AFI Sum(cm)     %Tile       Largest  Pocket(cm)  15.74           56          4.82  RUQ(cm)       RLQ(cm)       LUQ(cm)        LLQ(cm)  4.82          3.07          3.5            4.35 ---------------------------------------------------------------------- Biophysical Evaluation  Amniotic F.V:   Within normal limits       F. Tone:         Observed  F. Movement:    Observed                   Score:           8/8  F. Breathing:   Observed ---------------------------------------------------------------------- OB History  Gravidity:    6         Term:   0  Prem:   4        SAB:   1  Living:       3 ---------------------------------------------------------------------- Gestational Age  LMP:           30w 0d        Date:  08/02/20                 EDD:   05/09/21  Best:          31w 5d     Det. By:  U/S  (12/14/20)          EDD:   04/27/21 ---------------------------------------------------------------------- Anatomy  Cranium:               Appears normal         Abdomen:                Appears normal  Cavum:                 Appears normal         Abdominal Wall:         Appears nml (cord                                                                        insert, abd wall)  Ventricles:            Appears normal         Cord Vessels:           Appears normal (3                                                                        vessel cord)  Cerebellum:            Appears normal         Kidneys:                Appear normal  Posterior Fossa:       Appears normal         Bladder:                Appears normal  Stomach:               Appears normal, left                         sided  Other:  Technicallly difficult due to advanced GA and maternal habitus. ---------------------------------------------------------------------- Doppler - Fetal Vessels  Umbilical Artery    S/D    %tile      RI    %tile      PI    %tile            ADFV    RDFV   4.01       96    0.75       95    1.33   > 97.5  No      No  ---------------------------------------------------------------------- Comments  This patient has been hospitalized due to severe  preeeclampsia and IUGR.  A biophysical profile performed today was 8 out of 8.  There was normal amniotic fluid noted on today's ultrasound  exam.  Doppler studies of the umbilical arteries performed due to  fetal growth restriction showed ann elevated S/D ratio of 4.01.  There were no signs of absent or reversed end-diastolic flow  noted today. ----------------------------------------------------------------------                   Johnell Comings, MD Electronically Signed Final Report   02/28/2021 09:55 pm ----------------------------------------------------------------------    Medications:  Scheduled  labetalol  600 mg Oral BID   NIFEdipine  60 mg Oral BID   oxyCODONE  10 mg Oral Once   pantoprazole  40 mg Oral Daily   prenatal multivitamin  1 tablet Oral Q1200   I have reviewed the patient's current medications.  ASSESSMENT: [id number] Estimated Date of Delivery: 04/27/21  Patient Active Problem List   Diagnosis Date Noted   AKI (acute kidney injury) ([place]) 02/26/2021   IUGR (intrauterine growth restriction) affecting care of mother 02/26/2021   Severe pre-eclampsia, antepartum, third trimester 02/25/2021   Trichimoniasis 12/24/2020   History of trichomoniasis 12/21/2020   Multigravida of advanced maternal age in second trimester 12/21/2020   History of VBAC 12/21/2020   Prediabetes 12/21/2020   Supervision of high risk pregnancy, antepartum 10/09/2020   History of substance abuse ([place]) 10/09/2020   Chronic hypertension with exacerbation during pregnancy 09/19/2015   Cocaine abuse affecting pregnancy ([place]) 03/14/2014   Insufficient prenatal care 02/20/2014   History of preterm labor 02/20/2014   Tobacco use in pregnancy, antepartum 02/20/2014    PLAN: Increased labetalol to 600 TID and continue procardia 60 BID. Once two agents are maxed out, delivery will be  next step if BPs still severe. Stable labs. Reassuring FHT for now. S/P BMZ/Mg and S/P NICU consult Follow up today's MFM scan and recommendations Continue in house observation, deliver per clinical course or 34 weeks   Verita Schneiders, MD 03/03/2021,8:23 AM

## 2021-03-04 ENCOUNTER — Inpatient Hospital Stay (HOSPITAL_COMMUNITY): Payer: Medicaid Other | Admitting: Anesthesiology

## 2021-03-04 ENCOUNTER — Encounter (HOSPITAL_COMMUNITY): Payer: Self-pay | Admitting: Obstetrics and Gynecology

## 2021-03-04 ENCOUNTER — Encounter (HOSPITAL_COMMUNITY)
Admission: AD | Disposition: A | Discharge status: Home / Self Care | Payer: Self-pay | Source: Home / Self Care | Attending: Obstetrics & Gynecology

## 2021-03-04 ENCOUNTER — Other Ambulatory Visit: Payer: Self-pay

## 2021-03-04 DIAGNOSIS — O1413 Severe pre-eclampsia, third trimester: Secondary | ICD-10-CM | POA: Diagnosis not present

## 2021-03-04 DIAGNOSIS — O365930 Maternal care for other known or suspected poor fetal growth, third trimester, not applicable or unspecified: Secondary | ICD-10-CM

## 2021-03-04 DIAGNOSIS — O1414 Severe pre-eclampsia complicating childbirth: Secondary | ICD-10-CM

## 2021-03-04 DIAGNOSIS — Z3A32 32 weeks gestation of pregnancy: Secondary | ICD-10-CM

## 2021-03-04 DIAGNOSIS — Z30430 Encounter for insertion of intrauterine contraceptive device: Secondary | ICD-10-CM

## 2021-03-04 DIAGNOSIS — Z975 Presence of (intrauterine) contraceptive device: Secondary | ICD-10-CM

## 2021-03-04 DIAGNOSIS — O34211 Maternal care for low transverse scar from previous cesarean delivery: Secondary | ICD-10-CM

## 2021-03-04 DIAGNOSIS — O99324 Drug use complicating childbirth: Secondary | ICD-10-CM

## 2021-03-04 LAB — COMPREHENSIVE METABOLIC PANEL
ALT: 18 U/L (ref 0–44)
AST: 22 U/L (ref 15–41)
Albumin: 2.3 g/dL — ABNORMAL LOW (ref 3.5–5.0)
Alkaline Phosphatase: 148 U/L — ABNORMAL HIGH (ref 38–126)
Anion gap: 5 (ref 5–15)
BUN: 10 mg/dL (ref 6–20)
CO2: 23 mmol/L (ref 22–32)
Calcium: 8.1 mg/dL — ABNORMAL LOW (ref 8.9–10.3)
Chloride: 106 mmol/L (ref 98–111)
Creatinine, Ser: 0.79 mg/dL (ref 0.44–1.00)
GFR, Estimated: >60 mL/min (ref >60)
Glucose, Bld: 85 mg/dL (ref 70–99)
Potassium: 4.3 mmol/L (ref 3.5–5.1)
Sodium: 134 mmol/L — ABNORMAL LOW (ref 135–145)
Total Bilirubin: 0.6 mg/dL (ref 0.3–1.2)
Total Protein: 5 g/dL — ABNORMAL LOW (ref 6.5–8.1)

## 2021-03-04 LAB — CBC
HCT: 33 % — ABNORMAL LOW (ref 36.0–46.0)
Hemoglobin: 11.2 g/dL — ABNORMAL LOW (ref 12.0–15.0)
MCH: 30.6 pg (ref 26.0–34.0)
MCHC: 33.9 g/dL (ref 30.0–36.0)
MCV: 90.2 fL (ref 80.0–100.0)
Platelets: 172 10*3/uL (ref 150–400)
RBC: 3.66 MIL/uL — ABNORMAL LOW (ref 3.87–5.11)
RDW: 12.9 % (ref 11.5–15.5)
WBC: 7.1 10*3/uL (ref 4.0–10.5)
nRBC: 0.4 % — ABNORMAL HIGH (ref 0.0–0.2)

## 2021-03-04 SURGERY — Surgical Case
Anesthesia: Spinal | Wound class: Clean Contaminated

## 2021-03-04 MED ORDER — DIPHENHYDRAMINE HCL 50 MG/ML IJ SOLN
INTRAMUSCULAR | Status: DC | PRN
  Administered 2021-03-04: 12.5 mg via INTRAVENOUS

## 2021-03-04 MED ORDER — OXYTOCIN BOLUS FROM INFUSION: 333.0000 mL | Freq: Once | INTRAVENOUS | Status: DC

## 2021-03-04 MED ORDER — LIDOCAINE HCL (PF) 1 % IJ SOLN: 30.0000 mL | INTRAMUSCULAR | Status: DC | PRN

## 2021-03-04 MED ORDER — FLEET ENEMA 7-19 GM/118ML RE ENEM: 1.0000 | ENEMA | Freq: Every day | RECTAL | Status: DC | PRN

## 2021-03-04 MED ORDER — SCOPOLAMINE 1 MG/3DAYS TD PT72
MEDICATED_PATCH | TRANSDERMAL | Status: AC
  Filled 2021-03-04: qty 1

## 2021-03-04 MED ORDER — NALOXONE HCL 0.4 MG/ML IJ SOLN: 0.4000 mg | INTRAMUSCULAR | Status: DC | PRN

## 2021-03-04 MED ORDER — OXYTOCIN-SODIUM CHLORIDE 30-0.9 UT/500ML-% IV SOLN
1.0000 m[IU]/min | INTRAVENOUS | Status: DC
  Administered 2021-03-04: 2 m[IU]/min via INTRAVENOUS
  Filled 2021-03-04: qty 500

## 2021-03-04 MED ORDER — KETOROLAC TROMETHAMINE 30 MG/ML IJ SOLN: 30.0000 mg | Freq: Four times a day (QID) | INTRAMUSCULAR | Status: DC | PRN

## 2021-03-04 MED ORDER — OXYTOCIN-SODIUM CHLORIDE 30-0.9 UT/500ML-% IV SOLN: 2.5000 [IU]/h | INTRAVENOUS | Status: DC

## 2021-03-04 MED ORDER — LACTATED RINGERS IV SOLN: 500.0000 mL | INTRAVENOUS | Status: DC | PRN

## 2021-03-04 MED ORDER — OXYTOCIN-SODIUM CHLORIDE 30-0.9 UT/500ML-% IV SOLN
INTRAVENOUS | Status: DC | PRN
  Administered 2021-03-04: 30 [IU] via INTRAVENOUS

## 2021-03-04 MED ORDER — OXYCODONE HCL 5 MG PO TABS: 5.0000 mg | ORAL_TABLET | Freq: Once | ORAL | Status: DC | PRN

## 2021-03-04 MED ORDER — MAGNESIUM SULFATE 40 GM/1000ML IV SOLN
2.0000 g/h | INTRAVENOUS | Status: DC
  Administered 2021-03-04: 2 g/h via INTRAVENOUS

## 2021-03-04 MED ORDER — FENTANYL CITRATE (PF) 100 MCG/2ML IJ SOLN: 25.0000 ug | INTRAMUSCULAR | Status: DC | PRN

## 2021-03-04 MED ORDER — DEXAMETHASONE SODIUM PHOSPHATE 4 MG/ML IJ SOLN
INTRAMUSCULAR | Status: DC | PRN
  Administered 2021-03-04: 4 mg via INTRAVENOUS

## 2021-03-04 MED ORDER — ACETAMINOPHEN 325 MG PO TABS
650.0000 mg | ORAL_TABLET | ORAL | Status: DC | PRN
  Administered 2021-03-04: 650 mg via ORAL
  Filled 2021-03-04: qty 2

## 2021-03-04 MED ORDER — SOD CITRATE-CITRIC ACID 500-334 MG/5ML PO SOLN
30.0000 mL | ORAL | Status: DC | PRN
  Administered 2021-03-04: 30 mL via ORAL
  Filled 2021-03-04: qty 30

## 2021-03-04 MED ORDER — ONDANSETRON HCL 4 MG/2ML IJ SOLN
INTRAMUSCULAR | Status: AC
  Filled 2021-03-04: qty 2

## 2021-03-04 MED ORDER — OXYCODONE HCL 5 MG/5ML PO SOLN
5.0000 mg | Freq: Once | ORAL | Status: DC | PRN
Start: 2021-03-04 — End: 2021-03-04

## 2021-03-04 MED ORDER — NALBUPHINE HCL 10 MG/ML IJ SOLN: 5.0000 mg | Freq: Once | INTRAMUSCULAR | Status: DC | PRN

## 2021-03-04 MED ORDER — FENTANYL CITRATE (PF) 100 MCG/2ML IJ SOLN
INTRAMUSCULAR | Status: DC | PRN
  Administered 2021-03-04: 15 ug via INTRAVENOUS

## 2021-03-04 MED ORDER — SODIUM CHLORIDE 0.9 % IR SOLN
Status: DC | PRN
  Administered 2021-03-04: 1

## 2021-03-04 MED ORDER — SODIUM CHLORIDE 0.9 % IV SOLN: INTRAVENOUS | Status: DC | PRN

## 2021-03-04 MED ORDER — DEXMEDETOMIDINE (PRECEDEX) IN NS 20 MCG/5ML (4 MCG/ML) IV SYRINGE
PREFILLED_SYRINGE | INTRAVENOUS | Status: DC | PRN
  Administered 2021-03-04: 8 ug via INTRAVENOUS

## 2021-03-04 MED ORDER — LACTATED RINGERS IV SOLN: INTRAVENOUS | Status: DC

## 2021-03-04 MED ORDER — SCOPOLAMINE 1 MG/3DAYS TD PT72
MEDICATED_PATCH | TRANSDERMAL | Status: DC | PRN
  Administered 2021-03-04: 1 via TRANSDERMAL

## 2021-03-04 MED ORDER — CEFAZOLIN SODIUM-DEXTROSE 1-4 GM/50ML-% IV SOLN
1.0000 g | Freq: Three times a day (TID) | INTRAVENOUS | Status: DC
  Filled 2021-03-04 (×2): qty 50

## 2021-03-04 MED ORDER — LEVONORGESTREL 20.1 MCG/DAY IU IUD
1.0000 | INTRAUTERINE_SYSTEM | Freq: Once | INTRAUTERINE | Status: AC
  Administered 2021-03-04: 1 via INTRAUTERINE

## 2021-03-04 MED ORDER — PHENYLEPHRINE HCL-NACL 20-0.9 MG/250ML-% IV SOLN
INTRAVENOUS | Status: AC
  Filled 2021-03-04: qty 250

## 2021-03-04 MED ORDER — GENTAMICIN SULFATE 40 MG/ML IJ SOLN
5.0000 mg/kg | INTRAVENOUS | Status: AC
  Administered 2021-03-04: 107.3 mg via INTRAVENOUS
  Filled 2021-03-04 (×2): qty 7.25

## 2021-03-04 MED ORDER — MEPERIDINE HCL 25 MG/ML IJ SOLN
6.2500 mg | INTRAMUSCULAR | Status: DC | PRN
Start: 2021-03-04 — End: 2021-03-04

## 2021-03-04 MED ORDER — LEVONORGESTREL 20.1 MCG/DAY IU IUD
INTRAUTERINE_SYSTEM | INTRAUTERINE | Status: AC
  Filled 2021-03-04: qty 1

## 2021-03-04 MED ORDER — DEXAMETHASONE SODIUM PHOSPHATE 4 MG/ML IJ SOLN
INTRAMUSCULAR | Status: AC
  Filled 2021-03-04: qty 1

## 2021-03-04 MED ORDER — SODIUM CHLORIDE 0.9% FLUSH: 3.0000 mL | INTRAVENOUS | Status: DC | PRN

## 2021-03-04 MED ORDER — FENTANYL CITRATE (PF) 100 MCG/2ML IJ SOLN: 50.0000 ug | INTRAMUSCULAR | Status: DC | PRN

## 2021-03-04 MED ORDER — DIPHENHYDRAMINE HCL 50 MG/ML IJ SOLN
12.5000 mg | INTRAMUSCULAR | Status: DC | PRN
  Administered 2021-03-04: 12.5 mg via INTRAVENOUS
  Filled 2021-03-04: qty 1

## 2021-03-04 MED ORDER — MAGNESIUM SULFATE 40 GM/1000ML IV SOLN
INTRAVENOUS | Status: AC
  Administered 2021-03-04: 4 g via INTRAVENOUS
  Filled 2021-03-04: qty 1000

## 2021-03-04 MED ORDER — NALOXONE HCL 4 MG/10ML IJ SOLN
1.0000 ug/kg/h | INTRAVENOUS | Status: DC | PRN
  Filled 2021-03-04: qty 5

## 2021-03-04 MED ORDER — FENTANYL CITRATE (PF) 100 MCG/2ML IJ SOLN
INTRAMUSCULAR | Status: AC
  Filled 2021-03-04: qty 2

## 2021-03-04 MED ORDER — OXYTOCIN-SODIUM CHLORIDE 30-0.9 UT/500ML-% IV SOLN
INTRAVENOUS | Status: AC
  Filled 2021-03-04: qty 500

## 2021-03-04 MED ORDER — CLINDAMYCIN PHOSPHATE 900 MG/50ML IV SOLN
900.0000 mg | INTRAVENOUS | Status: AC
  Administered 2021-03-04: 900 mg via INTRAVENOUS

## 2021-03-04 MED ORDER — ZOLPIDEM TARTRATE 5 MG PO TABS: 5.0000 mg | ORAL_TABLET | Freq: Every evening | ORAL | Status: DC | PRN

## 2021-03-04 MED ORDER — PROMETHAZINE HCL 25 MG/ML IJ SOLN: 6.2500 mg | INTRAMUSCULAR | Status: DC | PRN

## 2021-03-04 MED ORDER — SOD CITRATE-CITRIC ACID 500-334 MG/5ML PO SOLN: 30.0000 mL | ORAL | Status: DC

## 2021-03-04 MED ORDER — MORPHINE SULFATE (PF) 0.5 MG/ML IJ SOLN
INTRAMUSCULAR | Status: AC
  Filled 2021-03-04: qty 10

## 2021-03-04 MED ORDER — TERBUTALINE SULFATE 1 MG/ML IJ SOLN: 0.2500 mg | Freq: Once | INTRAMUSCULAR | Status: DC | PRN

## 2021-03-04 MED ORDER — CLINDAMYCIN PHOSPHATE 900 MG/50ML IV SOLN
INTRAVENOUS | Status: AC
  Filled 2021-03-04: qty 50

## 2021-03-04 MED ORDER — OXYCODONE-ACETAMINOPHEN 5-325 MG PO TABS: 1.0000 | ORAL_TABLET | ORAL | Status: DC | PRN

## 2021-03-04 MED ORDER — DIPHENHYDRAMINE HCL 25 MG PO CAPS: 25.0000 mg | ORAL_CAPSULE | ORAL | Status: DC | PRN

## 2021-03-04 MED ORDER — LABETALOL HCL 200 MG PO TABS
800.0000 mg | ORAL_TABLET | Freq: Three times a day (TID) | ORAL | Status: DC
  Administered 2021-03-04: 800 mg via ORAL
  Filled 2021-03-04 (×2): qty 4

## 2021-03-04 MED ORDER — CEFAZOLIN SODIUM-DEXTROSE 2-4 GM/100ML-% IV SOLN
2.0000 g | Freq: Once | INTRAVENOUS | Status: AC
  Administered 2021-03-04: 2 g via INTRAVENOUS
  Filled 2021-03-04: qty 100

## 2021-03-04 MED ORDER — NALBUPHINE HCL 10 MG/ML IJ SOLN: 5.0000 mg | INTRAMUSCULAR | Status: DC | PRN

## 2021-03-04 MED ORDER — ONDANSETRON HCL 4 MG/2ML IJ SOLN: 4.0000 mg | Freq: Four times a day (QID) | INTRAMUSCULAR | Status: DC | PRN

## 2021-03-04 MED ORDER — HYDROXYZINE HCL 50 MG PO TABS: 50.0000 mg | ORAL_TABLET | Freq: Four times a day (QID) | ORAL | Status: DC | PRN

## 2021-03-04 MED ORDER — MORPHINE SULFATE (PF) 0.5 MG/ML IJ SOLN
INTRAMUSCULAR | Status: DC | PRN
  Administered 2021-03-04: 150 ug via EPIDURAL

## 2021-03-04 MED ORDER — ONDANSETRON HCL 4 MG/2ML IJ SOLN
4.0000 mg | Freq: Three times a day (TID) | INTRAMUSCULAR | Status: DC | PRN
Start: 2021-03-04 — End: 2021-03-06
  Administered 2021-03-05: 4 mg via INTRAVENOUS
  Filled 2021-03-04: qty 2

## 2021-03-04 MED ORDER — OXYCODONE-ACETAMINOPHEN 5-325 MG PO TABS: 2.0000 | ORAL_TABLET | ORAL | Status: DC | PRN

## 2021-03-04 MED ORDER — ALBUTEROL SULFATE (2.5 MG/3ML) 0.083% IN NEBU: 3.0000 mL | INHALATION_SOLUTION | Freq: Four times a day (QID) | RESPIRATORY_TRACT | Status: DC | PRN

## 2021-03-04 MED ORDER — MAGNESIUM SULFATE BOLUS VIA INFUSION
4.0000 g | Freq: Once | INTRAVENOUS | Status: AC
  Filled 2021-03-04: qty 1000

## 2021-03-04 MED ORDER — DEXMEDETOMIDINE (PRECEDEX) IN NS 20 MCG/5ML (4 MCG/ML) IV SYRINGE
PREFILLED_SYRINGE | INTRAVENOUS | Status: AC
  Filled 2021-03-04: qty 5

## 2021-03-04 MED ORDER — PHENYLEPHRINE HCL-NACL 20-0.9 MG/250ML-% IV SOLN
INTRAVENOUS | Status: DC | PRN
  Administered 2021-03-04: 30 ug/min via INTRAVENOUS

## 2021-03-04 MED ORDER — SCOPOLAMINE 1 MG/3DAYS TD PT72: 1.0000 | MEDICATED_PATCH | Freq: Once | TRANSDERMAL | Status: DC

## 2021-03-04 MED ORDER — ONDANSETRON HCL 4 MG/2ML IJ SOLN
INTRAMUSCULAR | Status: DC | PRN
  Administered 2021-03-04: 4 mg via INTRAVENOUS

## 2021-03-04 SURGICAL SUPPLY — 36 items
BENZOIN TINCTURE PRP APPL 2/3 (GAUZE/BANDAGES/DRESSINGS) ×3 IMPLANT
CLOSURE STERI-STRIP 1/2X4 (GAUZE/BANDAGES/DRESSINGS) ×1
CLOSURE WOUND 1/2 X4 (GAUZE/BANDAGES/DRESSINGS)
CLOTH BEACON ORANGE TIMEOUT ST (SAFETY) ×3 IMPLANT
CLSR STERI-STRIP ANTIMIC 1/2X4 (GAUZE/BANDAGES/DRESSINGS) ×2 IMPLANT
DRSG OPSITE POSTOP 4X10 (GAUZE/BANDAGES/DRESSINGS) ×3 IMPLANT
ELECT REM PT RETURN 9FT ADLT (ELECTROSURGICAL) ×3
ELECTRODE REM PT RTRN 9FT ADLT (ELECTROSURGICAL) ×1 IMPLANT
EXTRACTOR VACUUM KIWI (MISCELLANEOUS) IMPLANT
GAUZE SPONGE 4X4 12PLY STRL (GAUZE/BANDAGES/DRESSINGS) ×6 IMPLANT
GLOVE BIOGEL PI IND STRL 7.0 (GLOVE) ×1 IMPLANT
GLOVE BIOGEL PI INDICATOR 7.0 (GLOVE) ×2
GLOVE SURG ORTHO 8.0 STRL STRW (GLOVE) ×3 IMPLANT
GOWN STRL REUS W/TWL LRG LVL3 (GOWN DISPOSABLE) ×6 IMPLANT
HEMOSTAT ARISTA ABSORB 3G PWDR (HEMOSTASIS) ×3 IMPLANT
KIT ABG SYR 3ML LUER SLIP (SYRINGE) IMPLANT
NEEDLE HYPO 25X5/8 SAFETYGLIDE (NEEDLE) IMPLANT
NS IRRIG 1000ML POUR BTL (IV SOLUTION) ×3 IMPLANT
PACK C SECTION WH (CUSTOM PROCEDURE TRAY) ×3 IMPLANT
PAD ABD 8X7 1/2 STERILE (GAUZE/BANDAGES/DRESSINGS) ×3 IMPLANT
PAD OB MATERNITY 4.3X12.25 (PERSONAL CARE ITEMS) ×3 IMPLANT
PENCIL SMOKE EVAC W/HOLSTER (ELECTROSURGICAL) ×3 IMPLANT
RTRCTR C-SECT PINK 25CM LRG (MISCELLANEOUS) IMPLANT
STRIP CLOSURE SKIN 1/2X4 (GAUZE/BANDAGES/DRESSINGS) IMPLANT
SUT MON AB-0 CT1 36 (SUTURE) ×6 IMPLANT
SUT PLAIN 0 NONE (SUTURE) IMPLANT
SUT PLAIN 2 0 XLH (SUTURE) ×3 IMPLANT
SUT VIC AB 0 CT1 27 (SUTURE) ×6
SUT VIC AB 0 CT1 27XBRD ANBCTR (SUTURE) ×2 IMPLANT
SUT VIC AB 2-0 CT1 27 (SUTURE) ×3
SUT VIC AB 2-0 CT1 TAPERPNT 27 (SUTURE) ×1 IMPLANT
SUT VIC AB 4-0 KS 27 (SUTURE) ×3 IMPLANT
TAPE CLOTH SOFT 2X10 (GAUZE/BANDAGES/DRESSINGS) ×3 IMPLANT
TOWEL OR 17X24 6PK STRL BLUE (TOWEL DISPOSABLE) ×3 IMPLANT
TRAY FOLEY W/BAG SLVR 14FR LF (SET/KITS/TRAYS/PACK) ×3 IMPLANT
WATER STERILE IRR 1000ML POUR (IV SOLUTION) ×3 IMPLANT

## 2021-03-04 NOTE — Discharge Summary (Signed)
   Postpartum Discharge Summary  Date of Service 03/06/2021      Patient Name: Marissa Meyer DOB: 07/16/1981 MRN: [id number]  Date of admission: 02/25/2021 Delivery date:03/04/2021  Delivering provider: BASS, LAWRENCE A  Date of discharge: 03/06/2021  Admitting diagnosis: Severe pre-eclampsia [O14.10] Intrauterine pregnancy: [redacted]w[redacted]d     Secondary diagnosis:  Principal Problem:   Severe pre-eclampsia, antepartum, third trimester Active Problems:   Insufficient prenatal care   Cocaine abuse affecting pregnancy (HCC)   Chronic hypertension with superimposed severe preeclampsia   Supervision of high risk pregnancy, antepartum   Multigravida of advanced maternal age in third trimester   AKI (acute kidney injury) (HCC)   IUGR (intrauterine growth restriction) affecting care of mother   [redacted] weeks gestation of pregnancy   IUD (intrauterine device) in place  Additional problems: none    Discharge diagnosis: Preterm Pregnancy Delivered and Preeclampsia (severe)                                              Post partum procedures: no Liletta IUD placed intraoperatively  Augmentation: Pitocin Complications: None  Hospital course: Sceduled C/S   40 y.o. yo G6P0515 at [redacted]w[redacted]d was admitted to the hospital 02/25/2021 for severe preeclampsia and blood pressure management. Pregnancy also complicated by severe FGR <1%ile and abnormal umbilical dopplers. She was brought to L&D for delivery on 6/23 for worsening blood pressures despite two antihypertensive agents. She had a failed CST and was ultimately taken for repeat c-section with IUD insertion.  Membrane Rupture Time/Date: 6:30 PM ,03/04/2021   Delivery Method:C-Section, Low Transverse  Details of operation can be found in separate operative note.  Patient had an uncomplicated postpartum course.  She is ambulating, tolerating a regular diet, passing flatus, and urinating well. Patient is discharged home in stable condition on  03/06/21        Newborn  Data: Birth date:03/04/2021  Birth time:6:30 PM  Gender:Female  Living status:Living  Apgars:4 ,8  Weight:1230 g     Magnesium Sulfate received: Yes: Seizure prophylaxis BMZ received: Yes Rhophylac:N/A MMR:N/A T-DaP:Given postpartum Flu: No Transfusion:No  Physical exam  Vitals:   03/05/21 2139 03/05/21 2317 03/06/21 0410 03/06/21 0812  BP: 131/75 133/69 134/81 136/80  Pulse: 90 91 94 91  Resp:  18 18 17  Temp:  98.3 F (36.8 C) 98.6 F (37 C) 99.1 F (37.3 C)  TempSrc:  Oral Oral Oral  SpO2:   97% 99%  Weight:      Height:       General: alert, cooperative, and no distress Lochia: appropriate Uterine Fundus: firm Incision: Healing well with no significant drainage, Dressing is clean, dry, and intact DVT Evaluation: No evidence of DVT seen on physical exam. Labs: Lab Results  Component Value Date   WBC 11.4 (H) 03/05/2021   HGB 10.7 (L) 03/05/2021   HCT 31.1 (L) 03/05/2021   MCV 88.9 03/05/2021   PLT 187 03/05/2021   CMP Latest Ref Rng & Units 03/05/2021  Glucose 70 - 99 mg/dL 89  BUN 6 - 20 mg/dL 12  Creatinine 0.44 - 1.00 mg/dL 0.92  Sodium 135 - 145 mmol/L 133(L)  Potassium 3.5 - 5.1 mmol/L 4.6  Chloride 98 - 111 mmol/L 100  CO2 22 - 32 mmol/L 24  Calcium 8.9 - 10.3 mg/dL 7.3(L)  Total Protein 6.5 - 8.1 g/dL 5.4(L)  Total   Bilirubin 0.3 - 1.2 mg/dL 0.3  Alkaline Phos 38 - 126 U/L 140(H)  AST 15 - 41 U/L 34  ALT 0 - 44 U/L 20   Edinburgh Score: No flowsheet data found.   After visit meds:  Allergies as of 03/06/2021       Reactions   Amoxicillin Hives   Has patient had a PCN reaction causing immediate rash, facial/tongue/throat swelling, SOB or lightheadedness with hypotension: Yes Has patient had a PCN reaction causing severe rash involving mucus membranes or skin necrosis: No Has patient had a PCN reaction that required hospitalization No Has patient had a PCN reaction occurring within the last 10 years: Yes If all of the above answers are  "NO", then may proceed with Cephalosporin use.        Medication List     STOP taking these medications    acetaminophen 325 MG tablet Commonly known as: Tylenol   albuterol 108 (90 Base) MCG/ACT inhaler Commonly known as: VENTOLIN HFA   aspirin EC 81 MG tablet   iron polysaccharides 150 MG capsule Commonly known as: NIFEREX   omeprazole 20 MG capsule Commonly known as: PRILOSEC   ondansetron 4 MG disintegrating tablet Commonly known as: Zofran ODT   PRENATAL PO   promethazine 12.5 MG tablet Commonly known as: PHENERGAN       TAKE these medications    enalapril 5 MG tablet Commonly known as: VASOTEC Take 1 tablet (5 mg total) by mouth daily.   ibuprofen 600 MG tablet Commonly known as: ADVIL Take 1 tablet (600 mg total) by mouth every 6 (six) hours.   NIFEdipine 60 MG 24 hr tablet Commonly known as: ADALAT CC Take 1 tablet (60 mg total) by mouth 2 (two) times daily. What changed: when to take this   oxyCODONE 5 MG immediate release tablet Commonly known as: Oxy IR/ROXICODONE Take 1-2 tablets (5-10 mg total) by mouth every 4 (four) hours as needed for moderate pain.         Discharge home in stable condition Infant Feeding: Bottle and Breast Infant Disposition:NICU Discharge instruction: per After Visit Summary and Postpartum booklet. Activity: Advance as tolerated. Pelvic rest for 6 weeks.  Diet: routine diet Future Appointments:No future appointments. Follow up Visit:  [place] [facility] [place] at [facility] Follow up.   Specialty: Obstetrics and Gynecology Why: For wound re-check BP Contact information: [address] [place] [address] [phone]                 Please schedule this patient for a In person postpartum visit in 4 weeks with the following provider: MD. Additional Postpartum F/U:Incision check 1 week and BP check 1 week   High risk pregnancy  complicated by: HTN, cHTN w severe PreE, baby in NICU Delivery mode:  C-Section, Low Transverse  Anticipated Birth Control:  PP IUD placed   03/06/2021 Emeterio Reeve, MD

## 2021-03-04 NOTE — Progress Notes (Signed)
CSW met with patient's at bedside. When CSW arrived, patient was on the telephone. CSW offered to return at a later time and patient declined. Patient stated, "It's just my boyfriend."  Patient gave CSW verbal permission to speak with patient while patient remained on the phone with her boyfriend.  CSW explained CSW's role and patient was receptive to meeting and speaking with CSW.  Patient shared her post delivery plan is to care for infant and parent infant in patient's home. CSW confirmed that patient is aware that a [facility] CPS report will be made post delivery for infant's substance exposure and MOB's CPS hx; Patient was aware and understanding. Patient openly acknowledged not having all essential items to care for infant post delivery.  CSW agreed to shared available resources with patient after patient delivers.  Emotionally, patient reported that she is doing well and communicated, "I'm looking forward to being a new mom again.   CSW will continue to offer resources and supports to patient while patient remains inpatient.   Laurey Arrow, MSW, LCSW Clinical Social Work [phone]

## 2021-03-04 NOTE — Anesthesia Postprocedure Evaluation (Signed)
Anesthesia Post Note  Patient: Marissa Meyer  Procedure(s) Performed: [place]     Patient location during evaluation: Mother Baby Anesthesia Type: Spinal Level of consciousness: oriented and awake and alert Pain management: pain level controlled Vital Signs Assessment: post-procedure vital signs reviewed and stable Respiratory status: spontaneous breathing and respiratory function stable Cardiovascular status: blood pressure returned to baseline and stable Postop Assessment: no headache, no backache, no apparent nausea or vomiting and able to ambulate Anesthetic complications: no Comments: Pt  Hemodynamically Stable, moving all extremities, Tymp temp 95.8  Ok to go to floor    No notable events documented.  Last Vitals:  Vitals:   03/04/21 2100 03/04/21 2132  BP: 123/82 113/81  Pulse:  72  Resp: 13 18  Temp:  (!) 35.4 C  SpO2:      Last Pain:  Vitals:   03/04/21 2132  TempSrc: Temporal  PainSc:    Pain Goal: Patients Stated Pain Goal: 2 (03/02/21 2007)  LLE Motor Response: Non-purposeful movement (03/04/21 2050) LLE Sensation: Tingling (03/04/21 2050) RLE Motor Response: No movement due to regional block (03/04/21 2050) RLE Sensation: Tingling (03/04/21 2050)     Epidural/Spinal Function Cutaneous sensation: Tingles (03/04/21 2053), Patient able to flex knees: No (03/04/21 2053), Patient able to lift hips off bed: No (03/04/21 2053), Back pain beyond tenderness at insertion site: No (03/04/21 2053), Progressively worsening motor and/or sensory loss: No (03/04/21 2053), Bowel and/or bladder incontinence post epidural: No (03/04/21 2053)  Barnet Glasgow

## 2021-03-04 NOTE — Progress Notes (Signed)
[facility]N Attending Note  Subjective:  Called to evaluate patient with recurrent severe BPs, Labetalol IV protocol already in progress. Has just received 80 mg of Labetalol.  Patient denies any headaches, visual symptoms, RUQ/epigastric pain or other concerning symptoms.  FHR reassuring, no contractions, no LOF or vaginal bleeding. Good FM.   Admitted on 02/25/2021 for Severe pre-eclampsia, antepartum, third trimester.    Objective:  Blood pressure (!) 170/99, pulse 84, temperature 97.9 F (36.6 C), temperature source Oral, resp. rate 18, height 4\' 9"  (1.448 m), last menstrual period 08/02/2020, SpO2 100 %. Vitals:   03/04/21 1137 03/04/21 1150 03/04/21 1159 03/04/21 1212  BP: (!) 165/93 (!) 164/89 (!) 160/92 (!) 170/99  Pulse: 83  84   Resp: 18     Temp:      TempSrc:      SpO2:      Height:       FHT:  Baseline 130 bpm, moderate variability, + 10 x 10 accelerations, no decelerations Toco: flat Gen: NAD HENT: Normocephalic, atraumatic Lungs: Normal respiratory effort Heart: Regular rate noted Abdomen: NT gravid fundus, soft Cervix: Deferred Ext: 2+ DTRs, no edema, no cyanosis, negative Homan's sign  Labs: CBC Latest Ref Rng & Units 03/03/2021 02/28/2021 02/26/2021  WBC 4.0 - 10.5 K/uL 8.7 9.2 11.4(H)  Hemoglobin 12.0 - 15.0 g/dL 10.5(L) 10.8(L) 11.5(L)  Hematocrit 36.0 - 46.0 % 30.8(L) 32.5(L) 34.0(L)  Platelets 150 - 400 K/uL 164 157 170   CMP Latest Ref Rng & Units 03/03/2021 02/28/2021 02/26/2021  Glucose 70 - 99 mg/dL 80 92 119(H)  BUN 6 - 20 mg/dL 15 13 8   Creatinine 0.44 - 1.00 mg/dL 0.81 0.82 0.93  Sodium 135 - 145 mmol/L 134(L) 134(L) 131(L)  Potassium 3.5 - 5.1 mmol/L 4.0 3.7 3.6  Chloride 98 - 111 mmol/L 108 103 101  CO2 22 - 32 mmol/L 21(L) 23 21(L)  Calcium 8.9 - 10.3 mg/dL 7.7(L) 8.0(L) 7.1(L)  Total Protein 6.5 - 8.1 g/dL 4.6(L) 5.5(L) 6.1(L)  Total Bilirubin 0.3 - 1.2 mg/dL 0.4 0.4 0.2(L)  Alkaline Phos 38 - 126 U/L 138(H) 147(H) 167(H)   AST 15 - 41 U/L 18 18 17   ALT 0 - 44 U/L 15 18 18    Imaging: [place] MFM FETAL BPP WO NON STRESS  Result Date: 03/03/2021 ----------------------------------------------------------------------  OBSTETRICS REPORT                       (Signed Final 03/03/2021 04:51 pm) ---------------------------------------------------------------------- Patient Info  ID #:       [id number]                          D.O.B.:  04-21-1981 (39 yrs)  Name:       Marissa Meyer                  Visit Date: 03/03/2021 10:05 am ---------------------------------------------------------------------- Performed By  Attending:        Sander Nephew      Ref. Address:     [place]                    [place]  [place], [place]  Performed By:     Nathen May       [facility]                    [place]  Referred By:      [facility] ---------------------------------------------------------------------- Orders  #  Description                           Code        Ordered By  1  [place] MFM UA CORD DOPPLER                76820.02    CHARLIE PICKENS  2  [place] MFM FETAL BPP WO NON               [id number]    CHARLIE PICKENS     STRESS ----------------------------------------------------------------------  #  Order #                     Accession #                Episode #  1  [id number]                   [id number]                 [id number]  2  [id number]                   [id number]                 [id number] ---------------------------------------------------------------------- Indications  [redacted] weeks gestation of pregnancy                Z3A.46  Maternal care for known or suspected poor      O36.5930  fetal growth, third trimester, not applicable or  unspecified IUGR  Hypertension - Chronic/Pre-existing -          O10.019  procardia   Advanced maternal age multigravida 40+,        O33.523  third trimester (39)  History of cesarean delivery x2, currently     O34.219  pregnant  History of pre-term deliveries (34, 35 weeks)  O09.219  Substance abuse affecting pregnancy,           O99.320 F19.10  antepartum (THC)  Tobacco use complicating pregnancy, third      O99.333  trimester ---------------------------------------------------------------------- Fetal Evaluation  Num Of Fetuses:         1  Fetal Heart Rate(bpm):  140  Cardiac Activity:       Observed  Presentation:           Cephalic  Placenta:  Posterior  P. Cord Insertion:      Previously Visualized  Amniotic Fluid  AFI FV:      Within normal limits  AFI Sum(cm)     %Tile       Largest Pocket(cm)  20.2            77          7.24  RUQ(cm)       RLQ(cm)       LUQ(cm)        LLQ(cm)  7.24          5.87          6.14           0.95 ---------------------------------------------------------------------- Biophysical Evaluation  Amniotic F.V:   Pocket => 2 cm             F. Tone:        Observed  F. Movement:    Observed                   Score:          8/8  F. Breathing:   Observed ---------------------------------------------------------------------- OB History  Gravidity:    6         Term:   0        Prem:   4        SAB:   1  Living:       3 ---------------------------------------------------------------------- Gestational Age  LMP:           30w 3d        Date:  08/02/20                 EDD:   05/09/21  Best:          32w 1d     Det. By:  U/S  (12/14/20)          EDD:   04/27/21 ---------------------------------------------------------------------- Anatomy  Diaphragm:             Appears normal         Kidneys:                Appear normal  Stomach:               Appears normal, left   Bladder:                Appears normal                         sided ---------------------------------------------------------------------- Doppler - Fetal Vessels  Umbilical Artery   S/D     %tile       RI    %tile                             ADFV    RDFV   8.09   > 97.5    0.88   > 97.5                                No      No ---------------------------------------------------------------------- Impression  Antenatal testing due to FGR and superimposed  preeclampsia with severe features. The prior EFW and AC  were less than the 1st%.  Biophysical profile 8/8 with good fetal movement and  amniotic fluid volume  UA Dopplers are abnormal with  no evidence of AEDF or  REDF ---------------------------------------------------------------------- Recommendations  Continue 2x weekly testing with UA Dopplers. ----------------------------------------------------------------------               Sander Nephew, MD Electronically Signed Final Report   03/03/2021 04:51 pm ----------------------------------------------------------------------  [place] MFM UA CORD DOPPLER  Result Date: 03/03/2021 ----------------------------------------------------------------------  OBSTETRICS REPORT                       (Signed Final 03/03/2021 04:51 pm) ---------------------------------------------------------------------- Patient Info  ID #:       [id number]                          D.O.B.:  [birth year] (39 yrs)  Name:       Marissa Meyer                  Visit Date: 03/03/2021 10:05 am ---------------------------------------------------------------------- Performed By  Attending:        Sander Nephew      Ref. Address:     [address]                    [place]                                                             [place], [place]  Performed By:     Nathen May       Location:         [facility]                    [place]  Referred By:      [facility] ---------------------------------------------------------------------- Orders  #  Description                           Code         Ordered By  1  [place] MFM UA CORD DOPPLER                76820.02    CHARLIE PICKENS  2  [place] MFM FETAL BPP WO NON               71062.69    CHARLIE PICKENS     STRESS ----------------------------------------------------------------------  #  Order #                     Accession #                Episode #  1  [id number]                   [id number]                 [id number]  2  [id number]                   [id number]                 [id number] ---------------------------------------------------------------------- Indications  [redacted] weeks gestation of pregnancy                Z3A.34  Maternal care for known or suspected poor      O36.5930  fetal growth, third trimester, not applicable or  unspecified IUGR  Hypertension - Chronic/Pre-existing -          O10.019  procardia  Advanced maternal age multigravida 97+,        O56.523  third trimester (39)  History of cesarean delivery x2, currently     O34.219  pregnant  History of pre-term deliveries (34, 35 weeks)  O09.219  Substance abuse affecting pregnancy,           O99.320 F19.10  antepartum (THC)  Tobacco use complicating pregnancy, third      O99.333  trimester ---------------------------------------------------------------------- Fetal Evaluation  Num Of Fetuses:         1  Fetal Heart Rate(bpm):  140  Cardiac Activity:       Observed  Presentation:           Cephalic  Placenta:               Posterior  P. Cord Insertion:      Previously Visualized  Amniotic Fluid  AFI FV:      Within normal limits  AFI Sum(cm)     %Tile       Largest Pocket(cm)  20.2            77          7.24  RUQ(cm)       RLQ(cm)       LUQ(cm)        LLQ(cm)  7.24          5.87          6.14           0.95 ---------------------------------------------------------------------- Biophysical Evaluation  Amniotic F.V:   Pocket => 2 cm             F. Tone:        Observed  F. Movement:    Observed                   Score:          8/8  F. Breathing:   Observed  ---------------------------------------------------------------------- OB History  Gravidity:    6         Term:   0        Prem:   4        SAB:   1  Living:       3 ---------------------------------------------------------------------- Gestational Age  LMP:           30w 3d        Date:  08/02/20                 EDD:   05/09/21  Best:          32w 1d     Det. By:  U/S  (12/14/20)          EDD:   04/27/21 ---------------------------------------------------------------------- Anatomy  Diaphragm:             Appears normal         Kidneys:                Appear normal  Stomach:               Appears normal, left   Bladder:                Appears normal                         sided ---------------------------------------------------------------------- Doppler - Fetal Vessels  Umbilical Artery   S/D     %tile      RI    %tile                             ADFV    RDFV   8.09   > 97.5    0.88   > 97.5                                No      No ---------------------------------------------------------------------- Impression  Antenatal testing due to FGR and superimposed  preeclampsia with severe features. The prior EFW and AC  were less than the 1st%.  Biophysical profile 8/8 with good fetal movement and  amniotic fluid volume  UA Dopplers are abnormal with no evidence of AEDF or  REDF ---------------------------------------------------------------------- Recommendations  Continue 2x weekly testing with UA Dopplers. ----------------------------------------------------------------------               Sander Nephew, MD Electronically Signed Final Report   03/03/2021 04:51 pm ----------------------------------------------------------------------    Assessment & Plan:  40 y.o. [id number] at [redacted]w[redacted]d admitted for [facility] with superimposed severe preeclampsia , severe FGR of <1%, elevated UA dopplers in the setting of cocaine use, now with worsening severe BPs and maxed out on two antihypertensive agents. Counseled patient about  need for delivery, she agreed with this plan. Of note, this plan was also discussed with Dr. Gaynell Face (MFM).  Patient will be moved to L&D, and will get CST first.  If negative CST, will proceed with TOLAC, cervical ripening to be started with foley bulb and low dose pitocin.  Patient with history of SVD x 2>C/S>VBAC.  Counseled regarding TOLAC vs RCS; risks/benefits discussed in detail. All questions answered.  Patient elects for TOLAC, consent signed and placed in chart.  If positive CST or if fetus does not tolerate TOLAC process or other indication, repeat cesarean section indicated. Patient had said she wanted a tubal sterilization, she is unsure for now depending on the prognosis of this baby.  Counseled about LARC, she will want IUD if she decides not to proceed with BTL (papers were singed 02/26/21).  The risks of cesarean section were discussed with the patient including but were not limited to: bleeding which may require transfusion or reoperation; infection which may require antibiotics; injury to bowel, bladder, ureters or other surrounding organs; injury to the fetus; need for additional procedures including hysterectomy in the event of a life-threatening hemorrhage; formation of adhesions; placental abnormalities wth subsequent pregnancies; incisional problems; thromboembolic phenomenon and other postoperative/anesthesia complications. Risks of tubal sterilization discussed with patient including but not limited  to: risk of regret, permanence of method, bleeding, infection, injury to surrounding organs and need for additional procedures.  Failure risk of about 1% with increased risk of ectopic gestation if pregnancy occurs was also discussed with patient.  Also discussed possibility of post-tubal pain syndrome. If she decides to go with progestin IUD instead, discussed risks of irregular bleeding, cramping, infection, malpositioning of the IUD, increased risk of expulsion in postpartum period,  >99% contraception efficacy, increased risk of ectopic pregnancy with failure of method. The patient concurred with the proposed plan, giving informed written consent for the procedures.  Consent signed and placed in chart. Patient has been NPO since 0900 she will remain NPO for now. OB Anesthesiology team and OR RN in charge aware of this plan. Neonatology team also notified for this plan, patient did receive BMZ on 6/16 and 6/17 and has been seen for consult.   For now, will continue to manage BP here on OBSC, magnesium sulfate eclampsia prophylaxis restarted.  Continue close observation. To L&D when ready.    Verita Schneiders, MD, [place] [facility] [employer], [place]

## 2021-03-04 NOTE — Anesthesia Preprocedure Evaluation (Addendum)
Anesthesia Evaluation  Patient identified by MRN, date of birth, ID band Patient awake    Reviewed: Allergy & Precautions, NPO status , Patient's Chart, lab work & pertinent test results  History of Anesthesia Complications Negative for: history of anesthetic complications  Airway Mallampati: II  TM Distance: >3 FB Neck ROM: Full    Dental  (+) Dental Advisory Given, Chipped   Pulmonary Current Smoker and Patient abstained from smoking.,   Bronchitis since Covid in 2021, PRN inhaler use    Pulmonary exam normal        Cardiovascular hypertension (Chronic HTN with superimposed severe Pre-E), Pt. on medications Normal cardiovascular exam     Neuro/Psych  Headaches, negative psych ROS   GI/Hepatic negative GI ROS, (+)     substance abuse  cocaine use,   Endo/Other  negative endocrine ROS  Renal/GU negative Renal ROS     Musculoskeletal negative musculoskeletal ROS (+)   Abdominal   Peds  Hematology  (+) anemia ,  Plt 172k    Anesthesia Other Findings   Reproductive/Obstetrics                            Anesthesia Physical Anesthesia Plan  ASA: 3 and emergent  Anesthesia Plan: Spinal   Post-op Pain Management:    Induction:   PONV Risk Score and Plan: 2 and Treatment may vary due to age or medical condition and Propofol infusion  Airway Management Planned: Natural Airway and Simple Face Mask  Additional Equipment: None  Intra-op Plan:   Post-operative Plan:   Informed Consent: I have reviewed the patients History and Physical, chart, labs and discussed the procedure including the risks, benefits and alternatives for the proposed anesthesia with the patient or authorized representative who has indicated his/her understanding and acceptance.       Plan Discussed with: CRNA and Anesthesiologist  Anesthesia Plan Comments: (Labs reviewed, platelets acceptable. Discussed  risks and benefits of spinal, including spinal/epidural hematoma, infection, failed block, and PDPH. Patient expressed understanding and wished to proceed. )        Anesthesia Quick Evaluation

## 2021-03-04 NOTE — Anesthesia Procedure Notes (Addendum)
Spinal  Patient location during procedure: OB Start time: 03/04/2021 6:03 PM End time: 03/04/2021 6:08 PM Reason for block: surgical anesthesia Staffing Performed: anesthesiologist  Anesthesiologist: Barnet Glasgow, MD Preanesthetic Checklist Completed: patient identified, IV checked, risks and benefits discussed, surgical consent, monitors and equipment checked, pre-op evaluation and timeout performed Spinal Block Patient position: sitting Prep: DuraPrep and site prepped and draped Patient monitoring: heart rate, cardiac monitor, continuous pulse ox and blood pressure Approach: midline Location: L3-4 Injection technique: single-shot Needle Needle type: Pencan  Needle gauge: 24 G Needle length: 10 cm Needle insertion depth: 7 cm Assessment Sensory level: T4 Events: CSF return Additional Notes  1Attempt (s). Pt tolerated procedure well.

## 2021-03-04 NOTE — Progress Notes (Addendum)
Patient ID: Marissa Meyer, female   DOB: July 24, 1981, 40 y.o.   MRN: [id number] [place]) NOTE  Marissa Meyer is a 40 y.o. [phone] at [redacted]w[redacted]d  who is admitted for severe preeclampsia.    Fetal presentation is cephalic. Length of Stay:  7  Days  Date of admission:02/25/2021  Subjective: No severe BPs yesterday, had BPP 8/8, elevated UA Dopplers  but no AEDF or REDF.   Patient denies any headaches, visual symptoms, RUQ/epigastric pain or other concerning symptoms. Patient reports the fetal movement as active. Patient reports uterine contraction  activity as none. Patient reports  vaginal bleeding as none. Patient describes fluid per vagina as none.  Vitals:  Blood pressure (!) 146/89, pulse 92, temperature 97.9 F (36.6 C), temperature source Oral, resp. rate 18, height 4\' 9"  (1.448 m), last menstrual period 08/02/2020, SpO2 100 %. Patient Vitals for the past 24 hrs:  BP Temp Temp src Pulse Resp SpO2  03/04/21 0420 (!) 146/89 97.9 F (36.6 C) Oral 92 18 --  03/03/21 2328 (!) 150/90 98 F (36.7 C) Oral 91 18 --  03/03/21 2135 -- -- -- -- -- 100 %  03/03/21 2130 -- -- -- -- -- 99 %  03/03/21 2125 -- -- -- -- -- 100 %  03/03/21 2120 -- -- -- -- -- 100 %  03/03/21 2115 -- -- -- -- -- 100 %  03/03/21 1931 (!) 143/86 -- -- 92 -- --  03/03/21 1554 (!) 158/92 98.6 F (37 C) Oral 96 18 --  03/03/21 1302 (!) 153/92 98.1 F (36.7 C) Oral 91 18 99 %  03/03/21 1242 (!) 158/102 -- -- 97 -- --     Physical Examination: General appearance: alert, well appearing, and in no distress Abdomen: soft, nontender, nondistended, no masses or organomegaly Fundal Height:  size equals dates Pelvic Exam:  examination not indicated Cervical Exam: Not evaluated. . Extremities: extremities normal, atraumatic, no cyanosis or edema with DTRs 2+ bilaterally Membranes:intact   Fetal Monitoring:  Baseline: 130s bpm, Variability: Moderate {> 6 bpm), Accelerations: 10 x 10, and  Decelerations: Absent >> reactive  Labs:  CBC Latest Ref Rng & Units 03/03/2021 02/28/2021 02/26/2021  WBC 4.0 - 10.5 K/uL 8.7 9.2 11.4(H)  Hemoglobin 12.0 - 15.0 g/dL 10.5(L) 10.8(L) 11.5(L)  Hematocrit 36.0 - 46.0 % 30.8(L) 32.5(L) 34.0(L)  Platelets 150 - 400 K/uL 164 157 170   CMP Latest Ref Rng & Units 03/03/2021 02/28/2021 02/26/2021  Glucose 70 - 99 mg/dL 80 92 119(H)  BUN 6 - 20 mg/dL 15 13 8   Creatinine 0.44 - 1.00 mg/dL 0.81 0.82 0.93  Sodium 135 - 145 mmol/L 134(L) 134(L) 131(L)  Potassium 3.5 - 5.1 mmol/L 4.0 3.7 3.6  Chloride 98 - 111 mmol/L 108 103 101  CO2 22 - 32 mmol/L 21(L) 23 21(L)  Calcium 8.9 - 10.3 mg/dL 7.7(L) 8.0(L) 7.1(L)  Total Protein 6.5 - 8.1 g/dL 4.6(L) 5.5(L) 6.1(L)  Total Bilirubin 0.3 - 1.2 mg/dL 0.4 0.4 0.2(L)  Alkaline Phos 38 - 126 U/L 138(H) 147(H) 167(H)  AST 15 - 41 U/L 18 18 17   ALT 0 - 44 U/L 15 18 18     Imaging Studies:    [place] MFM FETAL BPP WO NON STRESS  Result Date: 03/03/2021 ----------------------------------------------------------------------  OBSTETRICS REPORT                       (Signed Final 03/03/2021 04:51 pm) ---------------------------------------------------------------------- Patient Info  ID #:  [id number]                          D.O.B.:  07-25-1981 (40 yrs)  Name:       Marissa Meyer                  Visit Date: 03/03/2021 10:05 am ---------------------------------------------------------------------- Performed By  Attending:        Sander Nephew      Ref. Address:     [address]                    MD                                                             [place], [place]  Performed By:     Nathen May       [facility]                    [place]  Referred By:      [facility] ----------------------------------------------------------------------  Orders  #  Description                           Code        Ordered By  1  [place] MFM UA CORD DOPPLER                76820.02    CHARLIE PICKENS  2  [place] MFM FETAL BPP WO NON               [id number]    CHARLIE PICKENS     STRESS ----------------------------------------------------------------------  #  Order #                     Accession #                Episode #  1  [id number]                   [id number]                 [id number]  2  [id number]                   [id number]                 [id number] ---------------------------------------------------------------------- Indications  [redacted] weeks gestation of pregnancy  Z3A.32  Maternal care for known or suspected poor      O36.5930  fetal growth, third trimester, not applicable or  unspecified IUGR  Hypertension - Chronic/Pre-existing -          O10.019  procardia  Advanced maternal age multigravida 46+,        O62.523  third trimester (39)  History of cesarean delivery x2, currently     O34.219  pregnant  History of pre-term deliveries (34, 35 weeks)  O09.219  Substance abuse affecting pregnancy,           O99.320 F19.10  antepartum (THC)  Tobacco use complicating pregnancy, third      O99.333  trimester ---------------------------------------------------------------------- Fetal Evaluation  Num Of Fetuses:         1  Fetal Heart Rate(bpm):  140  Cardiac Activity:       Observed  Presentation:           Cephalic  Placenta:               Posterior  P. Cord Insertion:      Previously Visualized  Amniotic Fluid  AFI FV:      Within normal limits  AFI Sum(cm)     %Tile       Largest Pocket(cm)  20.2            77          7.24  RUQ(cm)       RLQ(cm)       LUQ(cm)        LLQ(cm)  7.24          5.87          6.14           0.95 ---------------------------------------------------------------------- Biophysical Evaluation  Amniotic F.V:   Pocket => 2 cm             F. Tone:        Observed  F. Movement:    Observed                   Score:          8/8  F. Breathing:    Observed ---------------------------------------------------------------------- OB History  Gravidity:    6         Term:   0        Prem:   4        SAB:   1  Living:       3 ---------------------------------------------------------------------- Gestational Age  LMP:           30w 3d        Date:  08/02/20                 EDD:   05/09/21  Best:          32w 1d     Det. By:  U/S  (12/14/20)          EDD:   04/27/21 ---------------------------------------------------------------------- Anatomy  Diaphragm:             Appears normal         Kidneys:                Appear normal  Stomach:               Appears normal, left   Bladder:                Appears normal  sided ---------------------------------------------------------------------- Doppler - Fetal Vessels  Umbilical Artery   S/D     %tile      RI    %tile                             ADFV    RDFV   8.09   > 97.5    0.88   > 97.5                                No      No ---------------------------------------------------------------------- Impression  Antenatal testing due to FGR and superimposed  preeclampsia with severe features. The prior EFW and AC  were less than the 1st%.  Biophysical profile 8/8 with good fetal movement and  amniotic fluid volume  UA Dopplers are abnormal with no evidence of AEDF or  REDF ---------------------------------------------------------------------- Recommendations  Continue 2x weekly testing with UA Dopplers. ----------------------------------------------------------------------               Sander Nephew, MD Electronically Signed Final Report   03/03/2021 04:51 pm ----------------------------------------------------------------------  [place] MFM FETAL BPP WO NON STRESS  Result Date: 02/28/2021 ----------------------------------------------------------------------  OBSTETRICS REPORT                       (Signed Final 02/28/2021 09:55 pm)  ---------------------------------------------------------------------- Patient Info  ID #:       [id number]                          D.O.B.:  April 06, 1981 (39 yrs)  Name:       Marissa Meyer                  Visit Date: 02/28/2021 12:54 pm ---------------------------------------------------------------------- Performed By  Attending:        Johnell Comings MD         Ref. Address:      [address]                                                              [place], [place]  Performed By:     Wilnette Kales        [facility]  Referred By:      [facility] ---------------------------------------------------------------------- Orders  #  Description                           Code        Ordered By  1  [place] MFM FETAL BPP WO NON               76819.01    CHARLIE PICKENS     STRESS  2  [place] MFM UA CORD DOPPLER                76820.02    CHARLIE PICKENS ----------------------------------------------------------------------  #  Order #                     Accession #                Episode #  1  [id number]                   [id number]                 [id number]  2  [id number]                   [id number]                 [id number] ---------------------------------------------------------------------- Indications  Maternal care for known or suspected poor       O36.5930  fetal growth, third trimester, not applicable or  unspecified IUGR  Hypertension - Chronic/Pre-existing -           O10.019  procardia  Advanced maternal age multigravida 72+,         O45.523  third trimester (39)  History of cesarean delivery x2, currently      O34.219  pregnant  History of pre-term deliveries (34, 35 weeks)   O09.219  Substance abuse affecting pregnancy,            O99.320 F19.10  antepartum (THC)  Tobacco use complicating pregnancy, third       O99.333   trimester  [redacted] weeks gestation of pregnancy                 Z3A.31 ---------------------------------------------------------------------- Fetal Evaluation  Num Of Fetuses:          1  Fetal Heart Rate(bpm):   138  Cardiac Activity:        Observed  Presentation:            Cephalic  Placenta:                Posterior  P. Cord Insertion:       Previously Visualized  Amniotic Fluid  AFI FV:      Within normal limits  AFI Sum(cm)     %Tile       Largest Pocket(cm)  15.74           56          4.82  RUQ(cm)       RLQ(cm)       LUQ(cm)        LLQ(cm)  4.82          3.07          3.5            4.35 ---------------------------------------------------------------------- Biophysical Evaluation  Amniotic F.V:   Within normal limits       F. Tone:         Observed  F. Movement:  Observed                   Score:           8/8  F. Breathing:   Observed ---------------------------------------------------------------------- OB History  Gravidity:    6         Term:   0        Prem:   4        SAB:   1  Living:       3 ---------------------------------------------------------------------- Gestational Age  LMP:           30w 0d        Date:  08/02/20                 EDD:   05/09/21  Best:          31w 5d     Det. By:  U/S  (12/14/20)          EDD:   04/27/21 ---------------------------------------------------------------------- Anatomy  Cranium:               Appears normal         Abdomen:                Appears normal  Cavum:                 Appears normal         Abdominal Wall:         Appears nml (cord                                                                        insert, abd wall)  Ventricles:            Appears normal         Cord Vessels:           Appears normal (3                                                                        vessel cord)  Cerebellum:            Appears normal         Kidneys:                Appear normal  Posterior Fossa:       Appears normal         Bladder:                Appears  normal  Stomach:               Appears normal, left                         sided  Other:  Technicallly difficult due to advanced GA and maternal habitus. ---------------------------------------------------------------------- Doppler - Fetal Vessels  Umbilical Artery    S/D    %tile  RI    %tile      PI    %tile            ADFV    RDFV   4.01       96    0.75       95    1.33   > 97.5               No      No ---------------------------------------------------------------------- Comments  This patient has been hospitalized due to severe  preeeclampsia and IUGR.  A biophysical profile performed today was 8 out of 8.  There was normal amniotic fluid noted on today's ultrasound  exam.  Doppler studies of the umbilical arteries performed due to  fetal growth restriction showed ann elevated S/D ratio of 4.01.  There were no signs of absent or reversed end-diastolic flow  noted today. ----------------------------------------------------------------------                   Johnell Comings, MD Electronically Signed Final Report   02/28/2021 09:55 pm ----------------------------------------------------------------------  [place] MFM UA CORD DOPPLER  Result Date: 03/03/2021 ----------------------------------------------------------------------  OBSTETRICS REPORT                       (Signed Final 03/03/2021 04:51 pm) ---------------------------------------------------------------------- Patient Info  ID #:       [id number]                          D.O.B.:  March 02, 1981 (39 yrs)  Name:       Marissa Meyer                  Visit Date: 03/03/2021 10:05 am ---------------------------------------------------------------------- Performed By  Attending:        Sander Nephew      Ref. Address:     [address]                    [place]                                                             [place], [place]  Performed By:     Nathen May       Location:          [facility]                    [place]  Referred By:      [facility] ---------------------------------------------------------------------- Orders  #  Description  Code        Ordered By  1  [place] MFM UA CORD DOPPLER                [id number]    CHARLIE PICKENS  2  [place] MFM FETAL BPP WO NON               [id number]    CHARLIE PICKENS     STRESS ----------------------------------------------------------------------  #  Order #                     Accession #                Episode #  1  [id number]                   [id number]                 [id number]  2  [id number]                   [id number]                 [id number] ---------------------------------------------------------------------- Indications  [redacted] weeks gestation of pregnancy                Z3A.46  Maternal care for known or suspected poor      O36.5930  fetal growth, third trimester, not applicable or  unspecified IUGR  Hypertension - Chronic/Pre-existing -          O10.019  procardia  Advanced maternal age multigravida 2+,        O30.523  third trimester (39)  History of cesarean delivery x2, currently     O34.219  pregnant  History of pre-term deliveries (34, 35 weeks)  O09.219  Substance abuse affecting pregnancy,           O99.320 F19.10  antepartum (THC)  Tobacco use complicating pregnancy, third      O99.333  trimester ---------------------------------------------------------------------- Fetal Evaluation  Num Of Fetuses:         1  Fetal Heart Rate(bpm):  140  Cardiac Activity:       Observed  Presentation:           Cephalic  Placenta:               Posterior  P. Cord Insertion:      Previously Visualized  Amniotic Fluid  AFI FV:      Within normal limits  AFI Sum(cm)     %Tile       Largest Pocket(cm)  20.2            77          7.24  RUQ(cm)       RLQ(cm)       LUQ(cm)        LLQ(cm)  7.24          5.87          6.14           0.95  ---------------------------------------------------------------------- Biophysical Evaluation  Amniotic F.V:   Pocket => 2 cm             F. Tone:        Observed  F. Movement:    Observed                   Score:          8/8  F. Breathing:  Observed ---------------------------------------------------------------------- OB History  Gravidity:    6         Term:   0        Prem:   4        SAB:   1  Living:       3 ---------------------------------------------------------------------- Gestational Age  LMP:           30w 3d        Date:  08/02/20                 EDD:   05/09/21  Best:          32w 1d     Det. By:  U/S  (12/14/20)          EDD:   04/27/21 ---------------------------------------------------------------------- Anatomy  Diaphragm:             Appears normal         Kidneys:                Appear normal  Stomach:               Appears normal, left   Bladder:                Appears normal                         sided ---------------------------------------------------------------------- Doppler - Fetal Vessels  Umbilical Artery   S/D     %tile      RI    %tile                             ADFV    RDFV   8.09   > 97.5    0.88   > 97.5                                No      No ---------------------------------------------------------------------- Impression  Antenatal testing due to FGR and superimposed  preeclampsia with severe features. The prior EFW and AC  were less than the 1st%.  Biophysical profile 8/8 with good fetal movement and  amniotic fluid volume  UA Dopplers are abnormal with no evidence of AEDF or  REDF ---------------------------------------------------------------------- Recommendations  Continue 2x weekly testing with UA Dopplers. ----------------------------------------------------------------------               Sander Nephew, MD Electronically Signed Final Report   03/03/2021 04:51 pm ----------------------------------------------------------------------  [place] MFM UA CORD  DOPPLER  Result Date: 02/28/2021 ----------------------------------------------------------------------  OBSTETRICS REPORT                       (Signed Final 02/28/2021 09:55 pm) ---------------------------------------------------------------------- Patient Info  ID #:       [id number]                          D.O.B.:  08-Mar-1981 (39 yrs)  Name:       Marissa Meyer                  Visit Date: 02/28/2021 12:54 pm ---------------------------------------------------------------------- Performed By  Attending:        Johnell Comings MD         Ref. Address:      [address]  [place], [place]  Performed By:     Wilnette Kales        [facility]  Referred By:      [facility] ---------------------------------------------------------------------- Orders  #  Description                           Code        Ordered By  1  [place] MFM FETAL BPP WO NON               76819.01    CHARLIE PICKENS     STRESS  2  [place] MFM UA CORD DOPPLER                [id number]    CHARLIE PICKENS ----------------------------------------------------------------------  #  Order #                     Accession #                Episode #  1  [id number]                   [id number]                 [id number]  2  [id number]                   [id number]                 [id number] ---------------------------------------------------------------------- Indications  Maternal care for known or suspected poor       O36.5930  fetal growth, third trimester, not applicable or  unspecified IUGR  Hypertension - Chronic/Pre-existing -           O10.019  procardia  Advanced maternal age multigravida 32+,         O61.523  third trimester (39)  History of cesarean delivery x2, currently      O34.219  pregnant  History of  pre-term deliveries (34, 35 weeks)   O09.219  Substance abuse affecting pregnancy,            O99.320 F19.10  antepartum (THC)  Tobacco use complicating pregnancy, third       O99.333  trimester  [redacted] weeks gestation of pregnancy                 Z3A.31 ---------------------------------------------------------------------- Fetal Evaluation  Num Of Fetuses:          1  Fetal Heart Rate(bpm):   138  Cardiac Activity:        Observed  Presentation:  Cephalic  Placenta:                Posterior  P. Cord Insertion:       Previously Visualized  Amniotic Fluid  AFI FV:      Within normal limits  AFI Sum(cm)     %Tile       Largest Pocket(cm)  15.74           56          4.82  RUQ(cm)       RLQ(cm)       LUQ(cm)        LLQ(cm)  4.82          3.07          3.5            4.35 ---------------------------------------------------------------------- Biophysical Evaluation  Amniotic F.V:   Within normal limits       F. Tone:         Observed  F. Movement:    Observed                   Score:           8/8  F. Breathing:   Observed ---------------------------------------------------------------------- OB History  Gravidity:    6         Term:   0        Prem:   4        SAB:   1  Living:       3 ---------------------------------------------------------------------- Gestational Age  LMP:           30w 0d        Date:  08/02/20                 EDD:   05/09/21  Best:          31w 5d     Det. By:  U/S  (12/14/20)          EDD:   04/27/21 ---------------------------------------------------------------------- Anatomy  Cranium:               Appears normal         Abdomen:                Appears normal  Cavum:                 Appears normal         Abdominal Wall:         Appears nml (cord                                                                        insert, abd wall)  Ventricles:            Appears normal         Cord Vessels:           Appears normal (3  vessel cord)  Cerebellum:            Appears normal         Kidneys:                Appear normal  Posterior Fossa:       Appears normal         Bladder:                Appears normal  Stomach:               Appears normal, left                         sided  Other:  Technicallly difficult due to advanced GA and maternal habitus. ---------------------------------------------------------------------- Doppler - Fetal Vessels  Umbilical Artery    S/D    %tile      RI    %tile      PI    %tile            ADFV    RDFV   4.01       96    0.75       95    1.33   > 97.5               No      No ---------------------------------------------------------------------- Comments  This patient has been hospitalized due to severe  preeeclampsia and IUGR.  A biophysical profile performed today was 8 out of 8.  There was normal amniotic fluid noted on today's ultrasound  exam.  Doppler studies of the umbilical arteries performed due to  fetal growth restriction showed ann elevated S/D ratio of 4.01.  There were no signs of absent or reversed end-diastolic flow  noted today. ----------------------------------------------------------------------                   Johnell Comings, MD Electronically Signed Final Report   02/28/2021 09:55 pm ----------------------------------------------------------------------    Medications:  Scheduled  labetalol  600 mg Oral TID   NIFEdipine  60 mg Oral BID   pantoprazole  40 mg Oral Daily   prenatal multivitamin  1 tablet Oral Q1200   I have reviewed the patient's current medications.  ASSESSMENT: [id number] Estimated Date of Delivery: 04/27/21  Patient Active Problem List   Diagnosis Date Noted   [redacted] weeks gestation of pregnancy 03/03/2021   AKI (acute kidney injury) ([place]) 02/26/2021   IUGR (intrauterine growth restriction) affecting care of mother 02/26/2021   Severe pre-eclampsia, antepartum, third trimester 02/25/2021   Trichimoniasis 12/24/2020   History of trichomoniasis 12/21/2020    Multigravida of advanced maternal age in third trimester 12/21/2020   History of VBAC 12/21/2020   Prediabetes 12/21/2020   Supervision of high risk pregnancy, antepartum 10/09/2020   History of substance abuse ([place]) 10/09/2020   Chronic hypertension with superimposed severe preeclampsia 09/19/2015   Cocaine abuse affecting pregnancy ([place]) 03/14/2014   Insufficient prenatal care 02/20/2014   History of preterm labor 02/20/2014   Tobacco use in pregnancy, antepartum 02/20/2014    PLAN: Continue Labetalol 600 TID and Procardia 60 BID. Once two agents are maxed out, delivery will be next step if BPs still severe. Stable labs. Ordered for 1 hr GTT tomorrow,  not done yet. Reassuring FHT for now. S/P BMZ/Mg and S/P NICU consult Continue in house observation, deliver per clinical course or 34 weeks   Verita Schneiders, MD 03/04/2021,9:40 AM

## 2021-03-04 NOTE — Transfer of Care (Signed)
Immediate Anesthesia Transfer of Care Note  Patient: Marissa Meyer  Procedure(s) Performed: CESAREAN SECTION  Patient Location: PACU  Anesthesia Type:Spinal  Level of Consciousness: awake, alert  and oriented  Airway & Oxygen Therapy: Patient Spontanous Breathing  Post-op Assessment: Report given to RN and Post -op Vital signs reviewed and stable  Post vital signs: Reviewed and stable  Last Vitals:  Vitals Value Taken Time  BP 111/77 03/04/21 1923  Temp    Pulse 72 03/04/21 1931  Resp 14 03/04/21 1931  SpO2 100 % 03/04/21 1931  Vitals shown include unvalidated device data.  Last Pain:  Vitals:   03/04/21 1752  TempSrc: Oral  PainSc:       Patients Stated Pain Goal: 2 (83/77/93 9688)  Complications: No notable events documented.

## 2021-03-04 NOTE — Progress Notes (Signed)
Patient was transferred from PACU to [place] [facility] at 2200. Pt temp upon arrival was 92.4 rectally. Md was notified. Pt refused warm blankets. RN attempted to repeat rectal temp but pt refused. Will try again in the next hour.

## 2021-03-04 NOTE — Progress Notes (Signed)
Patient got in PACU at 1923. RN could  not get a temp on the patient oral or axillary. Patient refused warm blankets and bair hugger. Dr. Valma Cava was notified. Patient first temp at 2 hours was 95.8 temporal.

## 2021-03-04 NOTE — Op Note (Addendum)
Operative Note   SURGERY DATE: 03/04/2021  PRE-OP DIAGNOSIS:  *Pregnancy at 32 weeks 2 days *Fetal intolerance of labor  *Severe Preeclampsia  *History of Prior cesarean section   POST-OP DIAGNOSIS: Same   PROCEDURE: Repeat low transverse cesarean section via pfannenstiel skin incision with double layer uterine closure and Liletta IUD insertion   SURGEON: Surgeon(s) and Role:    Elgie Congo, Carolann Littler, MD - Primary    * Marsala, Placido Sou, MD - Assisting  ANESTHESIA: spinal  ESTIMATED BLOOD LOSS:  99  DRAINS: 1025 mL UOP via indwelling foley  TOTAL IV FLUIDS: 1128 mL crystalloid  VTE PROPHYLAXIS: SCDs to bilateral lower extremities  ANTIBIOTICS: Clindamycin, within 1 hour of skin incision  SPECIMENS: placenta to pathology  COMPLICATIONS: none  INDICATIONS: Patient admitted for severe preeclampsia and blood pressure management, severe FGR <1%ile. On 6/23 patient brought to L&D for contraction stress test due to worsening blood pressure despite multiple antihypertensive agents. Patient had multiple late decels and ultimately failed CST.   FINDINGS: No intra-abdominal adhesions were noted. Grossly normal uterus, tubes and ovaries. Clear amniotic fluid, cephalic  female infant, weight 1230gm, APGARs per NICU, intact placenta. Cord gas ph 7.192, pCO2 71, HCO3 21   PROCEDURE IN DETAIL: The patient was taken to the operating room where anesthesia was administered and normal fetal heart tones were confirmed. She was then prepped and draped in the normal fashion in the dorsal supine position with a leftward tilt.  After a time out was performed, a pfannensteil skin incision was made with the scalpel and carried through to the underlying layer of fascia. The fascia was then incised at the midline and this incision was extended laterally with the mayo scissors. Attention was turned to the superior aspect of the fascial incision which was grasped with the kocher clamps x 2, tented up and the  rectus muscles were dissected off with the mayo scissors. In a similar fashion the inferior aspect of the fascial incision was grasped with the kocher clamps, tented up and the rectus muscles dissected off with the mayo scissors. The rectus muscles were then separated in the midline and the peritoneum was entered bluntly. The alexis retractor was inserted.  A low transverse hysterotomy was made with the scalpel until the endometrial cavity was breached and the amniotic sac ruptured with the Allis clamp, yielding clear amniotic fluid. This incision was extended bluntly and the infant's head, shoulders and body were delivered atraumatically.The cord was clamped x 2 and cut, and the infant was handed to the awaiting pediatricians without delayed cord clamping. The placenta was then gradually expressed from the uterus and then the uterus was cleared of all clots and debris. A Liletta IUD was grasped with ring forceps and inserted into the uterine fundus.The strings were tucked into the cervical os with good effect.   The hysterotomy was repaired with a running suture of 1-0 monocryl. A second imbricating layer of 1-0 monocryl suture was then placed. Several figure-of-eight sutures of 1-0 monocryl were added to achieve excellent hemostasis.    The uterus and adnexa were then returned to the abdomen, and the hysterotomy and all operative sites were reinspected and excellent hemostasis was noted after irrigation and suction of the abdomen with warm saline. Arrista placed on hysterotomy site.  The fascia was reapproximated with 0 Vicryl in a simple running fashion bilaterally. The subcutaneous layer was then reapproximated with interrupted sutures of 2-0 plain gut, and the skin was then closed with 4-0 vicryl,  in a subcuticular fashion.  The patient  tolerated the procedure well. Sponge, lap, needle, and instrument counts were correct x 2. The patient was transferred to the recovery room awake, alert and breathing  independently in stable condition.   Sharene Skeans, MD OB Family Medicine Fellow, [facility] for [facility], [place] of Attending Supervision of Obstetric Fellow: Evaluation and management procedures were performed by the Obstetric Fellow under my supervision and collaboration.  I have reviewed the Obstetric Fellow's note and chart, and I agree with the management and plan. I have also made any necessary editorial changes.   Griffin Basil, MD Attending [place], [facility] for [facility], [facility] 03/08/2021 1:39 PM

## 2021-03-04 NOTE — Progress Notes (Signed)
Labor Progress Note Marissa Meyer is a 40 y.o. [phone] at [redacted]w[redacted]d presented for severe preeclampsia needing IV labetalol.   S: Pt had a negative contraction stress test; however, she has had a category 2 strip with minimal variability and recurrent late decelerations remote from delivery.  Findings discussed with the patient and she agrees to repeat cesarean section with postpartum IUD placement.  Consent signed previously.  O:  BP (!) 150/76   Pulse 79   Temp 97.9 F (36.6 C) (Oral)   Resp 16   Ht 4\' 9"  (1.448 m)   Wt 58.1 kg   LMP 08/02/2020   SpO2 100%   BMI 27.70 kg/m    CVE: Dilation: Fingertip Effacement (%): Thick Cervical Position: Middle Station: -3 Presentation: Vertex Exam by:: Dr. Harolyn Rutherford   A&P: 40 y.o. [id number] [redacted]w[redacted]d severe preeclampsia, nonreassuring fetal tracing Will proceed with repeat cesarean section and Liletta IUD placement.  Consent signed and pt has previously received risks and benefits.  Griffin Basil, MD 5:52 PM

## 2021-03-05 ENCOUNTER — Encounter (HOSPITAL_COMMUNITY): Payer: Self-pay | Admitting: Obstetrics and Gynecology

## 2021-03-05 LAB — COMPREHENSIVE METABOLIC PANEL
ALT: 20 U/L (ref 0–44)
AST: 34 U/L (ref 15–41)
Albumin: 2.5 g/dL — ABNORMAL LOW (ref 3.5–5.0)
Alkaline Phosphatase: 140 U/L — ABNORMAL HIGH (ref 38–126)
Anion gap: 9 (ref 5–15)
BUN: 12 mg/dL (ref 6–20)
CO2: 24 mmol/L (ref 22–32)
Calcium: 7.3 mg/dL — ABNORMAL LOW (ref 8.9–10.3)
Chloride: 100 mmol/L (ref 98–111)
Creatinine, Ser: 0.92 mg/dL (ref 0.44–1.00)
GFR, Estimated: >60 mL/min (ref >60)
Glucose, Bld: 89 mg/dL (ref 70–99)
Potassium: 4.6 mmol/L (ref 3.5–5.1)
Sodium: 133 mmol/L — ABNORMAL LOW (ref 135–145)
Total Bilirubin: 0.3 mg/dL (ref 0.3–1.2)
Total Protein: 5.4 g/dL — ABNORMAL LOW (ref 6.5–8.1)

## 2021-03-05 LAB — MAGNESIUM: Magnesium: 7.7 mg/dL (ref 1.7–2.4)

## 2021-03-05 LAB — CBC
HCT: 31.1 % — ABNORMAL LOW (ref 36.0–46.0)
Hemoglobin: 10.7 g/dL — ABNORMAL LOW (ref 12.0–15.0)
MCH: 30.6 pg (ref 26.0–34.0)
MCHC: 34.4 g/dL (ref 30.0–36.0)
MCV: 88.9 fL (ref 80.0–100.0)
Platelets: 187 10*3/uL (ref 150–400)
RBC: 3.5 MIL/uL — ABNORMAL LOW (ref 3.87–5.11)
RDW: 12.7 % (ref 11.5–15.5)
WBC: 11.4 10*3/uL — ABNORMAL HIGH (ref 4.0–10.5)
nRBC: 0 % (ref 0.0–0.2)

## 2021-03-05 MED ORDER — ACETAMINOPHEN 500 MG PO TABS
1000.0000 mg | ORAL_TABLET | Freq: Four times a day (QID) | ORAL | Status: DC
  Administered 2021-03-05 – 2021-03-06 (×5): 1000 mg via ORAL
  Filled 2021-03-05 (×5): qty 2

## 2021-03-05 MED ORDER — PRENATAL MULTIVITAMIN CH
1.0000 | ORAL_TABLET | Freq: Every day | ORAL | Status: DC
  Filled 2021-03-05 (×2): qty 1

## 2021-03-05 MED ORDER — OXYCODONE HCL 5 MG PO TABS: 5.0000 mg | ORAL_TABLET | ORAL | Status: DC | PRN

## 2021-03-05 MED ORDER — DIBUCAINE (PERIANAL) 1 % EX OINT: 1.0000 "application " | TOPICAL_OINTMENT | CUTANEOUS | Status: DC | PRN

## 2021-03-05 MED ORDER — ENOXAPARIN SODIUM 40 MG/0.4ML IJ SOSY
40.0000 mg | PREFILLED_SYRINGE | INTRAMUSCULAR | Status: DC
  Administered 2021-03-05: 40 mg via SUBCUTANEOUS
  Filled 2021-03-05: qty 0.4

## 2021-03-05 MED ORDER — MAGNESIUM SULFATE 40 GM/1000ML IV SOLN
2.0000 g/h | INTRAVENOUS | Status: AC
  Administered 2021-03-05: 2 g/h via INTRAVENOUS
  Filled 2021-03-05: qty 1000

## 2021-03-05 MED ORDER — SIMETHICONE 80 MG PO CHEW
80.0000 mg | CHEWABLE_TABLET | Freq: Three times a day (TID) | ORAL | Status: DC
  Administered 2021-03-05 – 2021-03-06 (×4): 80 mg via ORAL
  Filled 2021-03-05 (×4): qty 1

## 2021-03-05 MED ORDER — SIMETHICONE 80 MG PO CHEW: 80.0000 mg | CHEWABLE_TABLET | ORAL | Status: DC | PRN

## 2021-03-05 MED ORDER — COCONUT OIL OIL: 1.0000 "application " | TOPICAL_OIL | Status: DC | PRN

## 2021-03-05 MED ORDER — WITCH HAZEL-GLYCERIN EX PADS: 1.0000 "application " | MEDICATED_PAD | CUTANEOUS | Status: DC | PRN

## 2021-03-05 MED ORDER — NIFEDIPINE ER OSMOTIC RELEASE 60 MG PO TB24
60.0000 mg | ORAL_TABLET | Freq: Two times a day (BID) | ORAL | Status: DC
  Administered 2021-03-05 – 2021-03-06 (×4): 60 mg via ORAL
  Filled 2021-03-05 (×4): qty 1

## 2021-03-05 MED ORDER — KETOROLAC TROMETHAMINE 30 MG/ML IJ SOLN
30.0000 mg | Freq: Four times a day (QID) | INTRAMUSCULAR | Status: AC
  Administered 2021-03-05 (×4): 30 mg via INTRAVENOUS
  Filled 2021-03-05 (×4): qty 1

## 2021-03-05 MED ORDER — DIPHENHYDRAMINE HCL 25 MG PO CAPS: 25.0000 mg | ORAL_CAPSULE | Freq: Four times a day (QID) | ORAL | Status: DC | PRN

## 2021-03-05 MED ORDER — MENTHOL 3 MG MT LOZG: 1.0000 | LOZENGE | OROMUCOSAL | Status: DC | PRN

## 2021-03-05 MED ORDER — IBUPROFEN 600 MG PO TABS
600.0000 mg | ORAL_TABLET | Freq: Four times a day (QID) | ORAL | Status: DC
  Administered 2021-03-06: 600 mg via ORAL
  Filled 2021-03-05: qty 1

## 2021-03-05 MED ORDER — LACTATED RINGERS IV SOLN: INTRAVENOUS | Status: DC

## 2021-03-05 MED ORDER — ENALAPRIL MALEATE 5 MG PO TABS
5.0000 mg | ORAL_TABLET | Freq: Every day | ORAL | Status: DC
  Administered 2021-03-05: 5 mg via ORAL
  Filled 2021-03-05: qty 1

## 2021-03-05 MED ORDER — SENNOSIDES-DOCUSATE SODIUM 8.6-50 MG PO TABS
2.0000 | ORAL_TABLET | ORAL | Status: DC
  Administered 2021-03-05 (×2): 2 via ORAL
  Filled 2021-03-05 (×2): qty 2

## 2021-03-05 MED ORDER — OXYTOCIN-SODIUM CHLORIDE 30-0.9 UT/500ML-% IV SOLN: 2.5000 [IU]/h | INTRAVENOUS | Status: AC

## 2021-03-05 MED ORDER — TETANUS-DIPHTH-ACELL PERTUSSIS 5-2.5-18.5 LF-MCG/0.5 IM SUSY: 0.5000 mL | PREFILLED_SYRINGE | Freq: Once | INTRAMUSCULAR | Status: DC

## 2021-03-05 NOTE — Progress Notes (Signed)
POSTPARTUM PROGRESS NOTE  POD #1  Subjective:  Marissa Meyer is a 40 y.o. [phone] s/p repeat LTCS at [redacted]w[redacted]d.  She reports she is doing well. No acute events overnight.  She denies any problems with  po intake. Denies nausea or vomiting. She has  passed flatus. Pain is well controlled.  Lochia is minimal.  Magnesium sulfate recovery continues.  Objective: Blood pressure 134/82, pulse 75, temperature 97.7 F (36.5 C), temperature source Oral, resp. rate 18, height 4\' 9"  (1.448 m), weight 58.1 kg, last menstrual period 08/02/2020, SpO2 100 %, unknown if currently breastfeeding.  Physical Exam:  General: alert, cooperative and no distress Chest: no respiratory distress Heart:regular rate, distal pulses intact Abdomen: soft, nontender,  Uterine Fundus: firm, appropriately tender DVT Evaluation: No calf swelling or tenderness Extremities: minimal edema Skin: warm, dry; incision clean/dry/intact w/ honeycomb dressing in place  Recent Labs    03/03/21 0832 03/04/21 1245  HGB 10.5* 11.2*  HCT 30.8* 33.0*    Assessment/Plan: Marissa Meyer is a 40 y.o. [id number] s/p repeat at [redacted]w[redacted]d for severe preeclampsia and nonreassuring fetal tracing.  POD#1 -  Contraception: progesterone IUD Continue postpartum care D/c magnesium sulfate after the completion of 24 hours of recovery. Enalapril added for improved blood pressure coverage.   LOS: 8 days   Lynnda Shields, MD Faculty Attending, Center for [facility] 03/05/2021, 8:20 AM

## 2021-03-06 MED ORDER — ENALAPRIL MALEATE 5 MG PO TABS: 5.0000 mg | ORAL_TABLET | Freq: Every day | ORAL | 1 refills | Status: AC

## 2021-03-06 MED ORDER — ENALAPRIL MALEATE 5 MG PO TABS
5.0000 mg | ORAL_TABLET | Freq: Every day | ORAL | Status: DC
  Administered 2021-03-06: 5 mg via ORAL
  Filled 2021-03-06: qty 1

## 2021-03-06 MED ORDER — IBUPROFEN 600 MG PO TABS: 600.0000 mg | ORAL_TABLET | Freq: Four times a day (QID) | ORAL | 0 refills | Status: AC

## 2021-03-06 MED ORDER — OXYCODONE HCL 5 MG PO TABS: 5.0000 mg | ORAL_TABLET | ORAL | 0 refills | Status: DC | PRN

## 2021-03-06 MED ORDER — NIFEDIPINE ER 60 MG PO TB24: 60.0000 mg | ORAL_TABLET | Freq: Two times a day (BID) | ORAL | 1 refills | Status: AC

## 2021-03-06 NOTE — Plan of Care (Signed)
Patient to be discharged with printed instruction Toya Smothers, RN

## 2021-03-08 ENCOUNTER — Encounter: Payer: Self-pay | Admitting: Obstetrics and Gynecology

## 2021-03-08 ENCOUNTER — Ambulatory Visit: Payer: Medicaid Other

## 2021-03-08 DIAGNOSIS — O99820 Streptococcus B carrier state complicating pregnancy: Secondary | ICD-10-CM | POA: Insufficient documentation

## 2021-03-08 HISTORY — DX: Streptococcus B carrier state complicating pregnancy: O99.820

## 2021-03-08 LAB — CULTURE, BETA STREP (GROUP B ONLY)

## 2021-03-08 LAB — SURGICAL PATHOLOGY

## 2021-03-16 ENCOUNTER — Telehealth (HOSPITAL_COMMUNITY): Payer: Self-pay | Admitting: *Deleted

## 2021-03-16 ENCOUNTER — Other Ambulatory Visit: Payer: Self-pay

## 2021-03-16 ENCOUNTER — Ambulatory Visit: Payer: Medicaid Other | Admitting: Certified Nurse Midwife

## 2021-03-16 NOTE — Telephone Encounter (Signed)
Left message on patient's mobile phone number for patient to return RN's phone call. Attempted call to home number - RN informed that this is a wrong number. Erline Levine, RN, 03/16/21, 1606.

## 2021-03-17 ENCOUNTER — Other Ambulatory Visit: Payer: Self-pay

## 2021-03-17 ENCOUNTER — Ambulatory Visit: Payer: Medicaid Other

## 2021-03-22 ENCOUNTER — Ambulatory Visit: Payer: Medicaid Other

## 2021-03-22 ENCOUNTER — Other Ambulatory Visit: Payer: Self-pay

## 2021-03-24 ENCOUNTER — Encounter: Payer: Self-pay | Admitting: Certified Nurse Midwife

## 2021-03-24 ENCOUNTER — Ambulatory Visit (INDEPENDENT_AMBULATORY_CARE_PROVIDER_SITE_OTHER): Payer: Medicaid Other | Admitting: Certified Nurse Midwife

## 2021-03-24 ENCOUNTER — Other Ambulatory Visit: Payer: Self-pay

## 2021-03-24 VITALS — BP 153/106 | HR 92 | Wt 105.0 lb

## 2021-03-24 DIAGNOSIS — I10 Essential (primary) hypertension: Secondary | ICD-10-CM | POA: Diagnosis not present

## 2021-03-24 DIAGNOSIS — Z87898 Personal history of other specified conditions: Secondary | ICD-10-CM | POA: Diagnosis not present

## 2021-03-24 DIAGNOSIS — F1491 Cocaine use, unspecified, in remission: Secondary | ICD-10-CM

## 2021-03-24 NOTE — Progress Notes (Signed)
**Marissa Meyer De-Identified via Obfuscation** Marissa Marissa Meyer  Marissa Marissa Meyer is a 40 y.o. [phone] female who presents for a postpartum visit. She is 3 weeks postpartum following a repeat cesarean section.  I have fully reviewed the prenatal and intrapartum course. The delivery was at 32.2 gestational weeks.  Anesthesia: spinal. Postpartum course has been normal. Baby is doing well, is still in the NICU and there are barriers to baby's discharge home with mom. Pt has a history of cocaine use and both she/baby were positive at delivery. She has supervised visits with her older children and has an open CPS case with this baby, receiving counseling to help her retain parental rights. Also requests to be cleared for work (light duty working at [employer]) so she can "stay out of trouble." Baby is feeding by bottle - Similac Neosure. Bleeding staining only. Bowel function is normal. Bladder function is normal. Patient is not sexually active. Contraception method is IUD. Postpartum depression screening: negative.  Did not have time to take her BP meds this morning and then had a stressful morning.  The pregnancy intention screening data noted above was reviewed. The patient has an IUD in place.  Health Maintenance Due  Topic Date Due   Pneumococcal Vaccine 54-30 Years old (1 - PCV) Never done    The following portions of the patient's history were reviewed and updated as appropriate: allergies, current medications, past family history, past medical history, past social history, past surgical history, and problem list.  Review of Systems Pertinent items noted in HPI and remainder of comprehensive ROS otherwise negative.  Objective:  BP (!) 153/106   Pulse 92   Wt 105 lb (47.6 kg)   LMP  (LMP Unknown)   Breastfeeding No   BMI 22.72 kg/m    General:  alert, cooperative, appears stated age, and no distress   Breasts:  normal  Lungs: Normal effort  Heart:  regular rate and rhythm  Abdomen: soft, non-tender; bowel sounds  normal; no masses,  no organomegaly   Wound well approximated incision, steri strips removed  GU exam:  normal, IUD strings present and did not require trimming   Assessment & Plan:  Postpartum care and examination following Cesarean section History of cocaine use - Will clear her for light duty at work, also suggested she at least touch base with our behavioral health team in case she needs additional support with her sobriety and CPS case - referral made to integrated behavioral health Chronic hypertension - Strongly encouraged pt to take her BP meds as ordered, will collect PEC labs today as a precaution  Essential components of care per ACOG recommendations:  1.  Mood and well being: Patient with negative depression screening today. Reviewed local resources for support.  - Patient tobacco use? No.   - hx of drug use? Yes. Discussed support systems and outpatient/inpatient treatment options. Referral to [facility] accepted.    2. Infant care and feeding:  -Patient currently breastmilk feeding? No.  -Social determinants of health (SDOH) reviewed in EPIC.   3. Sexuality, contraception and birth spacing - Patient does not want a pregnancy in the next year.  Desired family size is 6 children.  - Reviewed forms of contraception in tiered fashion. Patient desired IUD today.   - Discussed birth spacing of 18 months  4. Sleep and fatigue -Encouraged family/partner/community support of 4 hrs of uninterrupted sleep to help with mood and fatigue  5. Physical Recovery  - Discussed patients delivery and complications. She  describes her labor as mixed. - Patient had a C-section.  - Patient has urinary incontinence? No. - Patient is safe to resume physical and sexual activity  6.  Health Maintenance - HM due items addressed Yes - Last pap smear  Diagnosis  Date Value Ref Range Status  10/09/2020   Final   - Negative for intraepithelial lesion or malignancy (NILM)   Pap smear not done at  today's visit.  -Breast Cancer screening indicated? No.   7. Chronic Disease/Pregnancy Condition follow up: Hypertension - PCP follow up  Marissa Marissa Meyer, [place] for [place]

## 2021-03-25 ENCOUNTER — Encounter: Payer: Self-pay | Admitting: Certified Nurse Midwife

## 2021-03-25 LAB — CBC
Hematocrit: 32.3 % — ABNORMAL LOW (ref 34.0–46.6)
Hemoglobin: 10.7 g/dL — ABNORMAL LOW (ref 11.1–15.9)
MCH: 29.6 pg (ref 26.6–33.0)
MCHC: 33.1 g/dL (ref 31.5–35.7)
MCV: 89 fL (ref 79–97)
Platelets: 316 x10E3/uL (ref 150–450)
RBC: 3.62 x10E6/uL — ABNORMAL LOW (ref 3.77–5.28)
RDW: 12.6 % (ref 11.7–15.4)
WBC: 4.7 x10E3/uL (ref 3.4–10.8)

## 2021-03-25 LAB — COMPREHENSIVE METABOLIC PANEL WITH GFR
ALT: 8 IU/L (ref 0–32)
AST: 11 IU/L (ref 0–40)
Albumin/Globulin Ratio: 2 (ref 1.2–2.2)
Albumin: 4.2 g/dL (ref 3.8–4.8)
Alkaline Phosphatase: 92 IU/L (ref 44–121)
BUN/Creatinine Ratio: 9 (ref 9–23)
BUN: 9 mg/dL (ref 6–20)
Bilirubin Total: 0.2 mg/dL (ref 0.0–1.2)
CO2: 23 mmol/L (ref 20–29)
Calcium: 9.3 mg/dL (ref 8.7–10.2)
Chloride: 106 mmol/L (ref 96–106)
Creatinine, Ser: 0.98 mg/dL (ref 0.57–1.00)
Globulin, Total: 2.1 g/dL (ref 1.5–4.5)
Glucose: 92 mg/dL (ref 65–99)
Potassium: 3.2 mmol/L — ABNORMAL LOW (ref 3.5–5.2)
Sodium: 144 mmol/L (ref 134–144)
Total Protein: 6.3 g/dL (ref 6.0–8.5)
eGFR: 75 mL/min/1.73

## 2021-04-04 ENCOUNTER — Emergency Department (HOSPITAL_COMMUNITY)
Admission: EM | Admit: 2021-04-04 | Discharge: 2021-04-04 | Disposition: A | Discharge status: Home / Self Care | Payer: Medicaid Other | Attending: Emergency Medicine | Admitting: Emergency Medicine

## 2021-04-04 ENCOUNTER — Other Ambulatory Visit: Payer: Self-pay

## 2021-04-04 ENCOUNTER — Encounter (HOSPITAL_COMMUNITY): Payer: Self-pay | Admitting: Emergency Medicine

## 2021-04-04 ENCOUNTER — Emergency Department (HOSPITAL_COMMUNITY): Payer: Medicaid Other

## 2021-04-04 DIAGNOSIS — F17210 Nicotine dependence, cigarettes, uncomplicated: Secondary | ICD-10-CM | POA: Insufficient documentation

## 2021-04-04 DIAGNOSIS — S91311A Laceration without foreign body, right foot, initial encounter: Secondary | ICD-10-CM | POA: Insufficient documentation

## 2021-04-04 DIAGNOSIS — Y92512 Supermarket, store or market as the place of occurrence of the external cause: Secondary | ICD-10-CM | POA: Diagnosis not present

## 2021-04-04 DIAGNOSIS — W25XXXA Contact with sharp glass, initial encounter: Secondary | ICD-10-CM | POA: Diagnosis not present

## 2021-04-04 DIAGNOSIS — I10 Essential (primary) hypertension: Secondary | ICD-10-CM | POA: Diagnosis not present

## 2021-04-04 DIAGNOSIS — Z8616 Personal history of COVID-19: Secondary | ICD-10-CM | POA: Diagnosis not present

## 2021-04-04 DIAGNOSIS — Z79899 Other long term (current) drug therapy: Secondary | ICD-10-CM | POA: Diagnosis not present

## 2021-04-04 DIAGNOSIS — Y9301 Activity, walking, marching and hiking: Secondary | ICD-10-CM | POA: Insufficient documentation

## 2021-04-04 DIAGNOSIS — S91312A Laceration without foreign body, left foot, initial encounter: Secondary | ICD-10-CM

## 2021-04-04 DIAGNOSIS — S99921A Unspecified injury of right foot, initial encounter: Secondary | ICD-10-CM | POA: Diagnosis present

## 2021-04-04 NOTE — ED Triage Notes (Signed)
Per EMS, Pt, from home, presents w/ 1in laceration to L foot after stepping on glass.  Pain score 2/10.  Bleeding is controlled.  Last tetanus was 1 yr ago.    BP is elevated and Pt is on multiple medications.

## 2021-04-04 NOTE — ED Provider Notes (Signed)
[facility] DEPT Provider Note   CSN: [id number] Arrival date & time: 04/04/21  1904     History Chief Complaint  Patient presents with   Laceration    Marissa Meyer is a 40 y.o. female.  Patient is a 40 year old woman who was brought by EMS following a laceration in her right foot. She was walking to the store wearing Crocs and stepped on broken glass. Her right foot immediately started bleeding, and when she returned home, removed a piece of glass that went through her shoe and lacerated her foot.  No complaints of numbness. She wrapped her foot in gauze and called EMS. On arrival to ED, reports foot has stopped bleeding unless pressure is applied. She has not taken anything for pain, reports its a 2/10 and has been constant. She gave birth via C Section at 25w2don 03/04/2021.   The history is provided by the patient.  Laceration Location:  Foot Foot laceration location:  Sole of R foot Length:  ~2 cm Depth:  Through dermis Quality: straight   Bleeding: controlled   Time since incident:  2 hours Laceration mechanism:  Broken glass Pain details:    Severity:  Moderate   Timing:  Constant Foreign body present:  Unable to specify Worsened by:  Pressure Tetanus status:  Up to date Associated symptoms: no fever, no focal weakness, no numbness, no rash, no redness and no swelling       Past Medical History:  Diagnosis Date   AKI (acute kidney injury) ([facility] 02/26/2021   Anemia    Chlamydia    Cocaine abuse affecting pregnancy ([facility] 03/14/2014   Positive UDS on 03/13/14; admitted for severe range BP and IUGR   COVID-19    Ectopic pregnancy    GBS (group B Streptococcus carrier), +RV culture, currently pregnant 03/08/2021   Gonorrhea    Hypertension    IUGR (intrauterine growth restriction) affecting care of mother 02/26/2021   Mental disorder    Migraines    but not diagnosed   Pregnancy induced hypertension    Preterm delivery    Severe  pre-eclampsia, antepartum, third trimester 02/25/2021   Tobacco use in pregnancy, antepartum 02/20/2014   Trichimoniasis 12/24/2020   + 04/11 Neg 05/09 in MAU + in urine in MAU 05/18   Trichomonas     Patient Active Problem List   Diagnosis Date Noted   IUD (intrauterine device) in place 03/04/2021   History of trichomoniasis 12/21/2020   History of VBAC 12/21/2020   Prediabetes 12/21/2020   History of substance abuse ([facility] 10/09/2020   History of preterm labor 02/20/2014    Past Surgical History:  Procedure Laterality Date   CESAREAN SECTION N/A 03/21/2014   Procedure: CESAREAN SECTION;  Surgeon: UOsborne Oman MD;  [facility];  Service: Obstetrics;  Laterality: N/A;   CESAREAN SECTION  03/04/2021   Procedure: CESAREAN SECTION;  Surgeon: BGriffin Basil MD;  Location: [facility];  Service: Obstetrics;;   DILATION AND CURETTAGE OF UTERUS     ECTOPIC PREGNANCY SURGERY       OB History     Gravida  6   Para  5   Term  0   Preterm  5   AB  1   Living  5      SAB      IAB      Ectopic  1   Multiple  0   Live Births  5  Family History  Problem Relation Age of Onset   Hypertension Mother    Hypertension Maternal Grandmother    Cancer Maternal Grandfather    Hypertension Father     Social History   Tobacco Use   Smoking status: Every Day    Packs/day: 0.25    Types: Cigars, Cigarettes    Last attempt to quit: 08/13/2013    Years since quitting: 7.6   Smokeless tobacco: Never   Tobacco comments:    black and milds; 2-3 per day  Vaping Use   Vaping Use: Never used  Substance Use Topics   Alcohol use: No   Drug use: Not Currently    Frequency: 1.0 times per week    Types: Marijuana, Cocaine    Home Medications Prior to Admission medications   Medication Sig Start Date End Date Taking? Authorizing Provider  enalapril (VASOTEC) 5 MG tablet Take 1 tablet (5 mg total) by mouth daily. 03/06/21   Woodroe Mode, MD  ibuprofen  (ADVIL) 600 MG tablet Take 1 tablet (600 mg total) by mouth every 6 (six) hours. 03/06/21   Woodroe Mode, MD  NIFEdipine (ADALAT CC) 60 MG 24 hr tablet Take 1 tablet (60 mg total) by mouth 2 (two) times daily. 03/06/21   Woodroe Mode, MD  atorvastatin (LIPITOR) 20 MG tablet Take 1 tablet (20 mg total) by mouth daily. Patient not taking: Reported on 08/17/2019 08/22/18 08/17/19  Scot Jun, FNP  hydrochlorothiazide (HYDRODIURIL) 25 MG tablet Take 1 tablet (25 mg total) by mouth daily. Patient not taking: Reported on 08/17/2019 04/11/19 08/17/19  Mesner, Corene Cornea, MD    Allergies    Amoxicillin  Review of Systems   Review of Systems  Constitutional:  Negative for chills, diaphoresis, fatigue and fever.  Skin:  Positive for wound. Negative for color change and rash.  Neurological:  Negative for dizziness, focal weakness, syncope, weakness, light-headedness and numbness.  All other systems reviewed and are negative.  Physical Exam Updated Vital Signs BP (!) 146/122   Pulse 73   Temp 98.6 F (37 C) (Oral)   Resp 20   LMP  (LMP Unknown)   SpO2 100%   Physical Exam Vitals and nursing note reviewed.  Constitutional:      Appearance: Normal appearance.  HENT:     Head: Normocephalic and atraumatic.  Eyes:     Extraocular Movements: Extraocular movements intact.     Conjunctiva/sclera: Conjunctivae normal.  Cardiovascular:     Rate and Rhythm: Normal rate and regular rhythm.     Pulses: Normal pulses.     Heart sounds: Normal heart sounds.  Pulmonary:     Effort: Pulmonary effort is normal.     Breath sounds: Normal breath sounds.  Musculoskeletal:        General: No swelling or deformity.     Right lower leg: No edema.     Left lower leg: No edema.     Right foot: Laceration present.     Comments: 2cm laceration to the plantar surface of the right foot without bleeding or visible foreign body  Skin:    General: Skin is warm and dry.     Findings: No rash.   Neurological:     Mental Status: She is alert.     Sensory: No sensory deficit.     Motor: No weakness.  Psychiatric:        Mood and Affect: Mood normal.        Behavior: Behavior normal.  ED Results / Procedures / Treatments   Labs (all labs ordered are listed, but only abnormal results are displayed) Labs Reviewed - No data to display  EKG None  Radiology DG Foot Complete Left  Result Date: 04/04/2021 CLINICAL DATA:  Stepped on glass. Laceration to the dorsal aspect of the left foot. EXAM: LEFT FOOT - COMPLETE 3+ VIEW COMPARISON:  None. FINDINGS: No fracture or bone lesion. Joints are normally spaced and aligned. Soft tissues are unremarkable.  No radiopaque foreign body. IMPRESSION: No fracture, dislocation or radiopaque foreign body. Electronically Signed   By: Lajean Manes M.D.   On: 04/04/2021 21:42    Procedures Procedures   Medications Ordered in ED Medications - No data to display  ED Course  I have reviewed the triage vital signs and the nursing notes.  Pertinent labs & imaging results that were available during my care of the patient were reviewed by me and considered in my medical decision making (see chart for details).    MDM Rules/Calculators/A&P                           Marissa Meyer is a 40 year old female brought to the ED via EMS for a laceration in her right foot, secondary to stepping on broken glass. She is up to date on Tdap. Will order x-ray to determine if any glass remains in wound. Laceration is small and well approximated, thus will likely heal well without stitches. Discussed treatment options with patient, and patient expressed a desire to let it heal on its own.   X-ray neg.  MDM   Amount and/or Complexity of Data Reviewed Tests in the radiology section of CPT: ordered and reviewed Independent visualization of images, tracings, or specimens: yes     Final Clinical Impression(s) / ED Diagnoses Final diagnoses:  Laceration of right  foot, initial encounter    Rx / DC Orders ED Discharge Orders     None        Blanchie Dessert, MD 04/04/21 2317

## 2021-05-31 ENCOUNTER — Other Ambulatory Visit (HOSPITAL_COMMUNITY): Payer: Self-pay

## 2021-12-11 ENCOUNTER — Encounter (HOSPITAL_COMMUNITY): Payer: Self-pay | Admitting: Emergency Medicine

## 2021-12-11 ENCOUNTER — Emergency Department (HOSPITAL_COMMUNITY): Payer: Medicaid Other

## 2021-12-11 ENCOUNTER — Other Ambulatory Visit: Payer: Self-pay

## 2021-12-11 ENCOUNTER — Emergency Department (HOSPITAL_COMMUNITY)
Admission: EM | Admit: 2021-12-11 | Discharge: 2021-12-11 | Disposition: A | Discharge status: Home / Self Care | Payer: Medicaid Other | Attending: Emergency Medicine | Admitting: Emergency Medicine

## 2021-12-11 DIAGNOSIS — I160 Hypertensive urgency: Secondary | ICD-10-CM | POA: Diagnosis not present

## 2021-12-11 DIAGNOSIS — R519 Headache, unspecified: Secondary | ICD-10-CM | POA: Diagnosis present

## 2021-12-11 DIAGNOSIS — Z79899 Other long term (current) drug therapy: Secondary | ICD-10-CM | POA: Insufficient documentation

## 2021-12-11 LAB — CBC WITH DIFFERENTIAL/PLATELET
Abs Immature Granulocytes: 0.04 10*3/uL (ref 0.00–0.07)
Basophils Absolute: 0 10*3/uL (ref 0.0–0.1)
Basophils Relative: 0 %
Eosinophils Absolute: 0 10*3/uL (ref 0.0–0.5)
Eosinophils Relative: 0 %
HCT: 40.8 % (ref 36.0–46.0)
Hemoglobin: 13.6 g/dL (ref 12.0–15.0)
Immature Granulocytes: 0 %
Lymphocytes Relative: 16 %
Lymphs Abs: 1.7 10*3/uL (ref 0.7–4.0)
MCH: 32.1 pg (ref 26.0–34.0)
MCHC: 33.3 g/dL (ref 30.0–36.0)
MCV: 96.2 fL (ref 80.0–100.0)
Monocytes Absolute: 0.4 10*3/uL (ref 0.1–1.0)
Monocytes Relative: 4 %
Neutro Abs: 8.3 10*3/uL — ABNORMAL HIGH (ref 1.7–7.7)
Neutrophils Relative %: 80 %
Platelets: 249 10*3/uL (ref 150–400)
RBC: 4.24 MIL/uL (ref 3.87–5.11)
RDW: 13 % (ref 11.5–15.5)
WBC: 10.5 10*3/uL (ref 4.0–10.5)
nRBC: 0 % (ref 0.0–0.2)

## 2021-12-11 LAB — BASIC METABOLIC PANEL
Anion gap: 10 (ref 5–15)
BUN: 12 mg/dL (ref 6–20)
CO2: 24 mmol/L (ref 22–32)
Calcium: 9.6 mg/dL (ref 8.9–10.3)
Chloride: 101 mmol/L (ref 98–111)
Creatinine, Ser: 0.91 mg/dL (ref 0.44–1.00)
GFR, Estimated: >60 mL/min (ref >60)
Glucose, Bld: 85 mg/dL (ref 70–99)
Potassium: 3.7 mmol/L (ref 3.5–5.1)
Sodium: 135 mmol/L (ref 135–145)

## 2021-12-11 LAB — I-STAT BETA HCG BLOOD, ED (MC, WL, AP ONLY): I-stat hCG, quantitative: <5 m[IU]/mL (ref <5)

## 2021-12-11 LAB — TROPONIN I (HIGH SENSITIVITY)
Troponin I (High Sensitivity): 5 ng/L (ref <18)
Troponin I (High Sensitivity): 5 ng/L (ref <18)

## 2021-12-11 LAB — D-DIMER, QUANTITATIVE: D-Dimer, Quant: <0.27 ug/mL-FEU (ref 0.00–0.50)

## 2021-12-11 MED ORDER — METOCLOPRAMIDE HCL 5 MG/ML IJ SOLN
5.0000 mg | Freq: Once | INTRAMUSCULAR | Status: AC
  Administered 2021-12-11: 5 mg via INTRAVENOUS
  Filled 2021-12-11: qty 2

## 2021-12-11 MED ORDER — DIPHENHYDRAMINE HCL 50 MG/ML IJ SOLN
12.5000 mg | Freq: Once | INTRAMUSCULAR | Status: AC
  Administered 2021-12-11: 12.5 mg via INTRAVENOUS
  Filled 2021-12-11: qty 1

## 2021-12-11 MED ORDER — SODIUM CHLORIDE 0.9 % IV BOLUS
1000.0000 mL | Freq: Once | INTRAVENOUS | Status: AC
  Administered 2021-12-11: 1000 mL via INTRAVENOUS

## 2021-12-11 MED ORDER — HYDRALAZINE HCL 20 MG/ML IJ SOLN
10.0000 mg | INTRAMUSCULAR | Status: DC
  Administered 2021-12-11: 10 mg via INTRAVENOUS
  Filled 2021-12-11: qty 1

## 2021-12-11 MED ORDER — MAGNESIUM SULFATE 2 GM/50ML IV SOLN
2.0000 g | Freq: Once | INTRAVENOUS | Status: AC
  Administered 2021-12-11: 2 g via INTRAVENOUS
  Filled 2021-12-11: qty 50

## 2021-12-11 NOTE — ED Triage Notes (Signed)
Pt reports hight blood pressure.  States she woke up with dizziness and blurred vision this morning at 6am.  Went to bed at midnight.  Denies numbness/weakness.  No arm drift.  Has been out of BP meds x 3 months.  ?

## 2021-12-11 NOTE — Discharge Instructions (Signed)

## 2021-12-11 NOTE — ED Provider Notes (Signed)
?[facility] ?Provider Note ? ? ?CSN: [id number] ?Arrival date & time: 12/11/21  1448 ? ?  ? ?History ? ?Chief Complaint  ?Patient presents with  ? Hypertension  ? ?Marissa Meyer is a 41 yo F h/o  with a hx of migraine and HTN presents for constant headache, blurry vision and dizziness when she woke up this morning at 6am. Went to sleep at 12am. Has been out of her BP medications x 55mo Also vomited once this morning as well. Endorses left visual field change without eye pain.she feels somewhat dizzy and off balance. No hearing changes or changes in sensation. Endorses chest tightness/pressure without difficulty breathing. Current smoker with chronic cough and uses cocaine last used on Thursday 3/30. Patient states that this feels the same as previous migraine HA. She was last normal at 12:00 AM at bedtime ? ? ? ?Hypertension ? ? ?  ? ?Home Medications ?Prior to Admission medications   ?Medication Sig Start Date End Date Taking? Authorizing Provider  ?enalapril (VASOTEC) 5 MG tablet Take 1 tablet (5 mg total) by mouth daily. 03/06/21   AWoodroe Mode MD  ?ibuprofen (ADVIL) 600 MG tablet Take 1 tablet (600 mg total) by mouth every 6 (six) hours. 03/06/21   AWoodroe Mode MD  ?NIFEdipine (ADALAT CC) 60 MG 24 hr tablet Take 1 tablet (60 mg total) by mouth 2 (two) times daily. 03/06/21   AWoodroe Mode MD  ?atorvastatin (LIPITOR) 20 MG tablet Take 1 tablet (20 mg total) by mouth daily. ?Patient not taking: Reported on 08/17/2019 08/22/18 08/17/19  HScot Jun FNP  ?hydrochlorothiazide (HYDRODIURIL) 25 MG tablet Take 1 tablet (25 mg total) by mouth daily. ?Patient not taking: Reported on 08/17/2019 04/11/19 08/17/19  Mesner, JCorene Cornea MD  ?   ? ?Allergies    ?Amoxicillin   ? ?Review of Systems   ?Review of Systems ? ?Physical Exam ?Updated Vital Signs ?BP (!) 186/120   Pulse (!) 108   Temp 98.1 ?F (36.7 ?C) (Oral)   Resp 18   LMP 11/23/2021   SpO2 100%  ?Physical Exam ?Vitals  and nursing note reviewed.  ?Constitutional:   ?   General: She is not in acute distress. ?   Appearance: She is well-developed. She is not diaphoretic.  ?HENT:  ?   Head: Normocephalic and atraumatic.  ?   Right Ear: External ear normal.  ?   Left Ear: External ear normal.  ?   Nose: Nose normal.  ?   Mouth/Throat:  ?   Mouth: Mucous membranes are moist.  ?Eyes:  ?   General: No scleral icterus. ?   Extraocular Movements: Extraocular movements intact.  ?   Conjunctiva/sclera: Conjunctivae normal.  ?   Pupils: Pupils are equal, round, and reactive to light.  ?   Comments: No horizontal, vertical or rotational nystagmus ?Visual fields full to confrontation  ?Neck:  ?   Comments: Full active and passive ROM without pain ?No midline or paraspinal tenderness ?No nuchal rigidity or meningeal signs ?Cardiovascular:  ?   Rate and Rhythm: Normal rate and regular rhythm.  ?   Heart sounds: Normal heart sounds. No murmur heard. ?  No friction rub. No gallop.  ?Pulmonary:  ?   Effort: Pulmonary effort is normal. No respiratory distress.  ?   Breath sounds: Normal breath sounds. No wheezing or rales.  ?Abdominal:  ?   General: Bowel sounds are normal. There is no distension.  ?  Palpations: Abdomen is soft. There is no mass.  ?   Tenderness: There is no abdominal tenderness. There is no guarding or rebound.  ?Musculoskeletal:     ?   General: Normal range of motion.  ?   Cervical back: Normal range of motion and neck supple.  ?Lymphadenopathy:  ?   Cervical: No cervical adenopathy.  ?Skin: ?   General: Skin is warm and dry.  ?   Findings: No rash.  ?Neurological:  ?   Mental Status: She is alert and oriented to person, place, and time.  ?   Cranial Nerves: No cranial nerve deficit.  ?   Motor: No abnormal muscle tone.  ?   Coordination: Coordination normal.  ?   Deep Tendon Reflexes: Reflexes are normal and symmetric.  ?   Comments: Mental Status:  ?Alert, oriented, thought content appropriate. Speech fluent without evidence  of aphasia. Able to follow 2 step commands without difficulty.  ?Cranial Nerves:  ?II:  Peripheral visual fields grossly normal, pupils equal, round, reactive to light ?III,IV, VI: ptosis not present, extra-ocular motions intact bilaterally  ?V,VII: smile symmetric, facial light touch sensation equal ?VIII: hearing grossly normal bilaterally  ?IX,X: midline uvula rise  ?XI: bilateral shoulder shrug equal and strong ?XII: midline tongue extension  ?Motor:  ?5/5 in upper and lower extremities bilaterally including strong and equal grip strength and dorsiflexion/plantar flexion ?Sensory: Pinprick and light touch normal in all extremities.  ?Deep Tendon Reflexes: 2+ and symmetric  ?Cerebellar: normal finger-to-nose with bilateral upper extremities ?Gait: normal gait and balance ?CV: distal pulses palpable throughout   ?Psychiatric:     ?   Behavior: Behavior normal.     ?   Thought Content: Thought content normal.     ?   Judgment: Judgment normal.  ? ? ?ED Results / Procedures / Treatments   ?Labs ?(all labs ordered are listed, but only abnormal results are displayed) ?Labs Reviewed - No data to display ? ?EKG ?None ? ?Radiology ?No results found. ? ?Procedures ?Procedures  ? ? ?Medications Ordered in ED ?Medications - No data to display ? ?ED Course/ Medical Decision Making/ A&P ?Clinical Course as of 12/11/21 1601  ?Sat Dec 11, 2021  ?1129 CT Head Wo Contrast ?I visualized ct Head- no acute findings, Agree with radiologic interpretation. [AH]  ?1129 EKG shows sinus tach at a rate of 100 with LVH pattern [AH]  ?1130 CBC with Differential/Platelet(!) [AH]  ?1130 I-Stat beta hCG blood, ED [AH]  ?5427 Basic metabolic panel [AH]  ?1130 Troponin I (High Sensitivity) ?Labs without sig abnormality [AH]  ?  ?Clinical Course User Index ?[AH] Margarita Mail, PA-C  ? ?                        ?Medical Decision Making ?Marissa Meyer presents with headache ?Given the large differential diagnosis for Marissa Meyer, the decision  making in this case is of high complexity. ? ?After evaluating all of the data points in this case, the presentation of Marissa Meyer is NOT consistent with skull fracture, meningitis/encephalitis, SAH/sentinel bleed, Intracranial Hemorrhage (ICH) (subdural/epidural), acute obstructive hydrocephalus, space occupying lesions, CVA, CO Poisoning, Basilar/vertebral artery dissection, preeclampsia, cerebral venous thrombosis, hypertensive emergency, temporal Arteritis, Idiopathic Intracranial Hypertension (pseudotumor cerebri). ? ?Strict return and follow-up precautions have been given by me personally or by detailed written instructions verbalized by nursing staff using the teach back method to patient/family/caregiver. ? ?Data Reviewed/Counseling: I have reviewed the  patient's vital signs, nursing notes, and other relevant tests/information. I had a detailed discussion regarding the historical points, exam findings, and any diagnostic results supporting the discharge diagnosis. I also discussed the need for outpatient follow-up and the need to return to the ED if symptoms worsen or if there are any questions or concerns that arise at hom ? ? ? ?Problems Addressed: ?Bad headache: acute illness or injury ?Hypertensive urgency: self-limited or minor problem ? ?Amount and/or Complexity of Data Reviewed ?Labs: ordered. Decision-making details documented in ED Course. ?   Details: D-dimer also negative, patient unable to provide UDS ?Radiology: ordered and independent interpretation performed. Decision-making details documented in ED Course. ? ?Risk ?Prescription drug management. ?Risk Details: Patient here with bad headache, hypertensive urgency.  CT scan negative.  Blood pressure improved with fluids and pain management alone.  She is persistently tachycardic but negative D-dimer, unable to provide UDS.  Question if her cocaine use was may be more recent than she admitted to.  Patient feels greatly improved.  She will be  discharged at this time.  Follow-up with primary care.  Given the fact that her blood pressure is normalized I am not reordering her blood pressure medication ? ? ? ?Final Clinical Impression(s) / ED Diagnos

## 2022-08-23 ENCOUNTER — Ambulatory Visit
Admission: EM | Admit: 2022-08-23 | Discharge: 2022-08-23 | Disposition: A | Discharge status: Home / Self Care | Payer: Medicaid Other | Attending: Physician Assistant | Admitting: Physician Assistant

## 2022-08-23 ENCOUNTER — Encounter: Payer: Self-pay | Admitting: Emergency Medicine

## 2022-08-23 DIAGNOSIS — K029 Dental caries, unspecified: Secondary | ICD-10-CM

## 2022-08-23 DIAGNOSIS — K047 Periapical abscess without sinus: Secondary | ICD-10-CM | POA: Diagnosis not present

## 2022-08-23 MED ORDER — CLINDAMYCIN HCL 300 MG PO CAPS: 300.0000 mg | ORAL_CAPSULE | Freq: Three times a day (TID) | ORAL | 0 refills | Status: AC

## 2022-08-23 MED ORDER — IBUPROFEN 800 MG PO TABS: 800.0000 mg | ORAL_TABLET | Freq: Three times a day (TID) | ORAL | 0 refills | Status: AC

## 2022-08-23 NOTE — Discharge Instructions (Signed)
Advised to contact [facility], [place] to see if they can arrange a dental appointment. The address is [address] in [place], [place]. You may want to try Dr. Quincy Simmonds in [place], [place] is a [phone].  Advised take the antibiotic 3 times a day until completed to treat the dental infection. Advised to take ibuprofen 800 mg every 8 hours with food to help reduce the dental pain. Advised follow-up PCP or return to urgent care as needed.

## 2022-08-23 NOTE — ED Provider Notes (Signed)
[facility]    CSN: [id number] Arrival date & time: 08/23/22  1228      History   Chief Complaint Chief Complaint  Patient presents with   Abscess    HPI Marissa Meyer is a 41 y.o. female.   41 year old female presents with dental pain.  Patient indicates that for the past couple days she has been having right upper tooth pain and discomfort.  Patient indicates that it is started swelling, she also started running fever last night with mild chills.  Patient indicates that she does not have any relief with ibuprofen OTC.  Patient indicates she does not have a dentist but she will call and try to contact Dentist next week.  She is currently tolerating fluids well.   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   Positive UDS on 03/13/14; admitted for severe range BP and IUGR   COVID-19    Ectopic pregnancy    GBS (group B Streptococcus carrier), +RV culture, currently pregnant 03/08/2021   Gonorrhea    Hypertension    IUGR (intrauterine growth restriction) affecting care of mother 02/26/2021   Mental disorder    Migraines    but not diagnosed   Pregnancy induced hypertension    Preterm delivery    Severe pre-eclampsia, antepartum, third trimester 02/25/2021   Tobacco use in pregnancy, antepartum 02/20/2014   Trichimoniasis 12/24/2020   + 04/11 Neg 05/09 in MAU + in urine in MAU 05/18   Trichomonas     Patient Active Problem List   Diagnosis Date Noted   IUD (intrauterine device) in place 03/04/2021   History of trichomoniasis 12/21/2020   History of VBAC 12/21/2020   Prediabetes 12/21/2020   History of substance abuse ([place]) 10/09/2020   History of preterm labor 02/20/2014    Past Surgical History:  Procedure Laterality Date   CESAREAN SECTION N/A 03/21/2014   Procedure: CESAREAN SECTION;  Surgeon:  Osborne Oman, MD;  Location: [facility];  Service: Obstetrics;  Laterality: N/A;   CESAREAN SECTION  03/04/2021   Procedure: CESAREAN SECTION;  Surgeon: Griffin Basil, MD;  Location: [facility];  Service: Obstetrics;;   DILATION AND CURETTAGE OF UTERUS     ECTOPIC PREGNANCY SURGERY      OB History     Gravida  6   Para  5   Term  0   Preterm  5   AB  1   Living  5      SAB      IAB      Ectopic  1   Multiple  0   Live Births  5            Home Medications    Prior to Admission medications   Medication Sig Start Date End Date Taking? Authorizing Provider  clindamycin (CLEOCIN) 300 MG capsule Take 1 capsule (300 mg total) by mouth 3 (three) times daily. 08/23/22  Yes Nyoka Lint, PA-C  ibuprofen (ADVIL) 800 MG tablet Take 1 tablet (800 mg total) by mouth 3 (three) times daily. 08/23/22  Yes Nyoka Lint, PA-C  enalapril (VASOTEC) 5 MG tablet Take 1 tablet (5 mg total) by mouth daily. Patient not taking: Reported on 12/11/2021 03/06/21   Woodroe Mode, MD  ibuprofen (ADVIL) 600 MG tablet Take 1  tablet (600 mg total) by mouth every 6 (six) hours. Patient not taking: Reported on 12/11/2021 03/06/21   Woodroe Mode, MD  NIFEdipine (ADALAT CC) 60 MG 24 hr tablet Take 1 tablet (60 mg total) by mouth 2 (two) times daily. Patient not taking: Reported on 12/11/2021 03/06/21   Woodroe Mode, MD  atorvastatin (LIPITOR) 20 MG tablet Take 1 tablet (20 mg total) by mouth daily. Patient not taking: Reported on 08/17/2019 08/22/18 08/17/19  Scot Jun, FNP  hydrochlorothiazide (HYDRODIURIL) 25 MG tablet Take 1 tablet (25 mg total) by mouth daily. Patient not taking: Reported on 08/17/2019 04/11/19 08/17/19  Mesner, Corene Cornea, MD    Family History Family History  Problem Relation Age of Onset   Hypertension Mother    Hypertension Maternal Grandmother    Cancer Maternal Grandfather    Hypertension Father     Social History Social History   Tobacco Use   Smoking status:  Every Day    Packs/day: 0.25    Types: Cigars, Cigarettes    Last attempt to quit: 08/13/2013    Years since quitting: 9.0   Smokeless tobacco: Never   Tobacco comments:    black and milds; 2-3 per day  Vaping Use   Vaping Use: Never used  Substance Use Topics   Alcohol use: No   Drug use: Not Currently    Frequency: 1.0 times per week    Types: Marijuana, Cocaine     Allergies   Amoxicillin   Review of Systems Review of Systems  Constitutional:  Positive for fever.  HENT:  Positive for dental problem (right upper dental abscess).      Physical Exam Triage Vital Signs ED Triage Vitals [08/23/22 1330]  Enc Vitals Group     BP (!) 158/88     Pulse Rate (!) 109     Resp 18     Temp 100 F (37.8 C)     Temp src      SpO2 97 %     Weight      Height      Head Circumference      Peak Flow      Pain Score 7     Pain Loc      Pain Edu?      Excl. in [place]?    No data found.  Updated Vital Signs BP (!) 158/88   Pulse (!) 109   Temp 100 F (37.8 C)   Resp 18   SpO2 97%   Breastfeeding No   Visual Acuity Right Eye Distance:   Left Eye Distance:   Bilateral Distance:    Right Eye Near:   Left Eye Near:    Bilateral Near:     Physical Exam Constitutional:      Appearance: Normal appearance.  HENT:     Right Ear: Tympanic membrane and ear canal normal.     Left Ear: Tympanic membrane and ear canal normal.     Mouth/Throat:     Mouth: Mucous membranes are moist.     Dentition: Dental tenderness, gingival swelling, dental caries (right upper incisor) and dental abscesses present.     Pharynx: Oropharynx is clear.  Neurological:     Mental Status: She is alert.      UC Treatments / Results  Labs (all labs ordered are listed, but only abnormal results are displayed) Labs Reviewed - No data to display  EKG   Radiology No results found.  Procedures Procedures (including critical  care time)  Medications Ordered in UC Medications - No data to  display  Initial Impression / Assessment and Plan / UC Course  I have reviewed the triage vital signs and the nursing notes.  Pertinent labs & imaging results that were available during my care of the patient were reviewed by me and considered in my medical decision making (see chart for details).    Plan: 1.  The dental abscess will be treated with the following: A.  Clindamycin 300 mg every 8 hours to treat the infection. B.  Patient has been given resources for [facility] dental clinic. 2.  The dental pain will be treated with the following: A.  Ibuprofen 800 mg every 8 hours with food to help treat dental pain. 3.  Patient advised to follow-up PCP or return to urgent care if symptoms fail to improve. Final Clinical Impressions(s) / UC Diagnoses   Final diagnoses:  Dental abscess  Pain due to dental caries     Discharge Instructions      Advised to contact [facility], [phone] to see if they can arrange a dental appointment. The address is [address] in [place], [place]. You may want to try Dr. Quincy Simmonds in [place], [place] is a [phone].  Advised take the antibiotic 3 times a day until completed to treat the dental infection. Advised to take ibuprofen 800 mg every 8 hours with food to help reduce the dental pain. Advised follow-up PCP or return to urgent care as needed.    ED Prescriptions     Medication Sig Dispense Auth. Provider   clindamycin (CLEOCIN) 300 MG capsule Take 1 capsule (300 mg total) by mouth 3 (three) times daily. 21 capsule Nyoka Lint, PA-C   ibuprofen (ADVIL) 800 MG tablet Take 1 tablet (800 mg total) by mouth 3 (three) times daily. 21 tablet Nyoka Lint, PA-C      PDMP not reviewed this encounter.   Nyoka Lint, PA-C 08/23/22 1400

## 2022-08-23 NOTE — ED Triage Notes (Addendum)
Pt is present today with an abscess on the left side of her mouth  that she noticed x2 days ago . Pt denies trauma to the mouth. Pt states that she needs to have a tooth pulled where the abscess is located

## 2023-07-03 ENCOUNTER — Encounter (HOSPITAL_BASED_OUTPATIENT_CLINIC_OR_DEPARTMENT_OTHER): Payer: Self-pay | Admitting: Emergency Medicine

## 2023-07-03 ENCOUNTER — Emergency Department (HOSPITAL_COMMUNITY)
Admission: EM | Admit: 2023-07-03 | Discharge: 2023-07-03 | Discharge status: Against Medical Advice | Payer: 59 | Attending: Emergency Medicine | Admitting: Emergency Medicine

## 2023-07-03 ENCOUNTER — Other Ambulatory Visit: Payer: Self-pay

## 2023-07-03 ENCOUNTER — Encounter (HOSPITAL_COMMUNITY): Payer: Self-pay

## 2023-07-03 DIAGNOSIS — S31805A Open bite of unspecified buttock, initial encounter: Secondary | ICD-10-CM | POA: Insufficient documentation

## 2023-07-03 DIAGNOSIS — Z5321 Procedure and treatment not carried out due to patient leaving prior to being seen by health care provider: Secondary | ICD-10-CM | POA: Diagnosis not present

## 2023-07-03 DIAGNOSIS — W540XXA Bitten by dog, initial encounter: Secondary | ICD-10-CM | POA: Insufficient documentation

## 2023-07-03 DIAGNOSIS — S41151A Open bite of right upper arm, initial encounter: Secondary | ICD-10-CM | POA: Insufficient documentation

## 2023-07-03 DIAGNOSIS — Z23 Encounter for immunization: Secondary | ICD-10-CM | POA: Insufficient documentation

## 2023-07-03 DIAGNOSIS — S51851A Open bite of right forearm, initial encounter: Secondary | ICD-10-CM | POA: Insufficient documentation

## 2023-07-03 NOTE — ED Triage Notes (Signed)
Pt reports she was walking and got attacked by two dogs, has a puncture wound on the right lateral forearm, small amount of adipose tissue protruding from wound, cleaned & dressed in triage, no active bleeding  Second puncture and one inch laceration on right upper buttock, cleaned and dressed in triage, no active bleeding

## 2023-07-03 NOTE — ED Triage Notes (Signed)
Pt to ED by EMS from home following a dog bite to the R arm and right gluteal area. PD and animal control were on scene, unknown what the dogs vaccination status is. VSS, NADN.

## 2023-07-04 ENCOUNTER — Emergency Department (HOSPITAL_BASED_OUTPATIENT_CLINIC_OR_DEPARTMENT_OTHER)
Admission: EM | Admit: 2023-07-04 | Discharge: 2023-07-04 | Disposition: A | Discharge status: Home / Self Care | Payer: 59 | Attending: Emergency Medicine | Admitting: Emergency Medicine

## 2023-07-04 DIAGNOSIS — S41151A Open bite of right upper arm, initial encounter: Secondary | ICD-10-CM | POA: Diagnosis not present

## 2023-07-04 DIAGNOSIS — W540XXA Bitten by dog, initial encounter: Secondary | ICD-10-CM

## 2023-07-04 MED ORDER — ACETAMINOPHEN 500 MG PO TABS
1000.0000 mg | ORAL_TABLET | Freq: Once | ORAL | Status: AC
  Administered 2023-07-04: 1000 mg via ORAL
  Filled 2023-07-04: qty 2

## 2023-07-04 MED ORDER — CLINDAMYCIN HCL 300 MG PO CAPS
300.0000 mg | ORAL_CAPSULE | Freq: Three times a day (TID) | ORAL | 0 refills | Status: AC
Start: 2023-07-04 — End: ?

## 2023-07-04 MED ORDER — IBUPROFEN 800 MG PO TABS
800.0000 mg | ORAL_TABLET | Freq: Three times a day (TID) | ORAL | 0 refills | Status: AC
Start: 2023-07-04 — End: ?

## 2023-07-04 MED ORDER — IBUPROFEN 800 MG PO TABS
800.0000 mg | ORAL_TABLET | Freq: Once | ORAL | Status: AC
  Administered 2023-07-04: 800 mg via ORAL
  Filled 2023-07-04: qty 1

## 2023-07-04 MED ORDER — TETANUS-DIPHTH-ACELL PERTUSSIS 5-2.5-18.5 LF-MCG/0.5 IM SUSY
0.5000 mL | PREFILLED_SYRINGE | Freq: Once | INTRAMUSCULAR | Status: AC
  Administered 2023-07-04: 0.5 mL via INTRAMUSCULAR
  Filled 2023-07-04: qty 0.5

## 2023-07-04 MED ORDER — RABIES VACCINE, PCEC IM SUSR
1.0000 mL | Freq: Once | INTRAMUSCULAR | Status: DC
  Filled 2023-07-04: qty 1

## 2023-07-04 MED ORDER — CLINDAMYCIN HCL 150 MG PO CAPS
300.0000 mg | ORAL_CAPSULE | Freq: Once | ORAL | Status: AC
  Administered 2023-07-04: 300 mg via ORAL
  Filled 2023-07-04: qty 2

## 2023-07-04 MED ORDER — RABIES IMMUNE GLOBULIN 150 UNIT/ML IM INJ
20.0000 [IU]/kg | INJECTION | Freq: Once | INTRAMUSCULAR | Status: DC
  Filled 2023-07-04: qty 6

## 2023-07-04 NOTE — ED Notes (Addendum)
Per Animal Control, both dogs were impounded by animal control.

## 2023-07-04 NOTE — ED Provider Notes (Signed)
[facility] Provider Note   CSN: [id number] Arrival date & time: 07/03/23  2253     History  Chief Complaint  Patient presents with   Animal Bite    Marissa Meyer is a 42 y.o. female.  The history is provided by the patient.  Animal Bite Contact animal:  Dog Animal bite location: forearm right dorsal and buttock right cheek abrasion. Time since incident:  4 hours Pain details:    Quality:  Aching   Severity:  Mild   Progression:  Unchanged Incident location:  Outside Provoked: unprovoked   Notifications:  Patent examiner and animal control Animal's rabies vaccination status:  Unknown Animal in possession: yes   Tetanus status:  Unknown Relieved by:  Nothing Worsened by:  Nothing Ineffective treatments:  None tried Associated symptoms: no fever        Home Medications Prior to Admission medications   Medication Sig Start Date End Date Taking? Authorizing Provider  clindamycin (CLEOCIN) 300 MG capsule Take 1 capsule (300 mg total) by mouth 3 (three) times daily. 07/04/23  Yes Autumn Pruitt, MD  clindamycin (CLEOCIN) 300 MG capsule Take 1 capsule (300 mg total) by mouth 3 (three) times daily. 08/23/22   Ellsworth Lennox, PA-C  enalapril (VASOTEC) 5 MG tablet Take 1 tablet (5 mg total) by mouth daily. Patient not taking: Reported on 12/11/2021 03/06/21   Adam Phenix, MD  ibuprofen (ADVIL) 600 MG tablet Take 1 tablet (600 mg total) by mouth every 6 (six) hours. Patient not taking: Reported on 12/11/2021 03/06/21   Adam Phenix, MD  ibuprofen (ADVIL) 800 MG tablet Take 1 tablet (800 mg total) by mouth 3 (three) times daily. 08/23/22   Ellsworth Lennox, PA-C  NIFEdipine (ADALAT CC) 60 MG 24 hr tablet Take 1 tablet (60 mg total) by mouth 2 (two) times daily. Patient not taking: Reported on 12/11/2021 03/06/21   Adam Phenix, MD  atorvastatin (LIPITOR) 20 MG tablet Take 1 tablet (20 mg total) by mouth daily. Patient not taking:  Reported on 08/17/2019 08/22/18 08/17/19  Bing Neighbors, NP  hydrochlorothiazide (HYDRODIURIL) 25 MG tablet Take 1 tablet (25 mg total) by mouth daily. Patient not taking: Reported on 08/17/2019 04/11/19 08/17/19  Mesner, Barbara Cower, MD      Allergies    Amoxicillin    Review of Systems   Review of Systems  Constitutional:  Negative for fever.  HENT:  Negative for facial swelling.   Eyes:  Negative for redness.  Respiratory:  Negative for wheezing and stridor.   Cardiovascular:  Negative for chest pain.  Skin:  Positive for wound.  All other systems reviewed and are negative.   Physical Exam Updated Vital Signs BP (!) 181/95 (BP Location: Left Arm)   Pulse 78   Temp 98.9 F (37.2 C) (Oral)   Resp 18   Ht 4\' 9"  (1.448 m)   Wt 46.7 kg   LMP 06/26/2023 (Exact Date)   SpO2 100%   BMI 22.29 kg/m  Physical Exam Vitals and nursing note reviewed.  Constitutional:      General: She is not in acute distress.    Appearance: Normal appearance. She is well-developed.  HENT:     Head: Normocephalic and atraumatic.     Nose: Nose normal.  Eyes:     Pupils: Pupils are equal, round, and reactive to light.  Cardiovascular:     Rate and Rhythm: Normal rate and regular rhythm.  Pulses: Normal pulses.     Heart sounds: Normal heart sounds.  Pulmonary:     Effort: Pulmonary effort is normal. No respiratory distress.     Breath sounds: Normal breath sounds.  Abdominal:     General: Bowel sounds are normal. There is no distension.     Palpations: Abdomen is soft.     Tenderness: There is no abdominal tenderness. There is no guarding or rebound.  Musculoskeletal:        General: Normal range of motion.       Arms:     Cervical back: Neck supple.  Skin:    General: Skin is warm and dry.     Capillary Refill: Capillary refill takes less than 2 seconds.     Findings: No erythema or rash.       Neurological:     General: No focal deficit present.     Mental Status: She is alert.      Deep Tendon Reflexes: Reflexes normal.  Psychiatric:        Mood and Affect: Mood normal.     ED Results / Procedures / Treatments   Labs (all labs ordered are listed, but only abnormal results are displayed) Labs Reviewed - No data to display  EKG None  Radiology No results found.  Procedures Procedures    Medications Ordered in ED Medications  Tdap (BOOSTRIX) injection 0.5 mL (0.5 mLs Intramuscular Given 07/04/23 0314)  acetaminophen (TYLENOL) tablet 1,000 mg (1,000 mg Oral Given 07/04/23 1610)  ibuprofen (ADVIL) tablet 800 mg (800 mg Oral Given 07/04/23 0312)  clindamycin (CLEOCIN) capsule 300 mg (300 mg Oral Given 07/04/23 9604)    ED Course/ Medical Decision Making/ A&P                                 Medical Decision Making Dog bite outdoors this evening dogs have been placed in quarantine   Amount and/or Complexity of Data Reviewed Independent Historian: spouse    Details: See above  External Data Reviewed: notes.    Details: Previous notes reviewed   Risk OTC drugs. Prescription drug management. Risk Details: Patient was vaccinated for tetanus. Wounds cleansed and irrigated and covered.  As dogs are in custody, will defer rabies vaccines and immunoglobulin at this time.   Given PCN allergy I have spoke with pharmacy and decision made to start clindamycin for 7 days. Patient is instructed to follow up with hand surgery for ongoing     Final Clinical Impression(s) / ED Diagnoses Final diagnoses:  Dog bite, initial encounter   Return for intractable cough, coughing up blood, fevers > 100.4 unrelieved by medication, shortness of breath, intractable vomiting, chest pain, shortness of breath, weakness, numbness, changes in speech, facial asymmetry, abdominal pain, passing out, Inability to tolerate liquids or food, cough, altered mental status or any concerns. No signs of systemic illness or infection. The patient is nontoxic-appearing on exam and vital signs  are within normal limits.  I have reviewed the triage vital signs and the nursing notes. Pertinent labs & imaging results that were available during my care of the patient were reviewed by me and considered in my medical decision making (see chart for details). After history, exam, and medical workup I feel the patient has been appropriately medically screened and is safe for discharge home. Pertinent diagnoses were discussed with the patient. Patient was given return precautions Rx / DC Orders ED Discharge  Orders          Ordered    clindamycin (CLEOCIN) 300 MG capsule  3 times daily        07/04/23 0250              Eulas Schweitzer, MD 07/04/23 [phone]

## 2023-08-28 ENCOUNTER — Other Ambulatory Visit: Payer: Self-pay | Admitting: Obstetrics and Gynecology

## 2023-08-28 DIAGNOSIS — R928 Other abnormal and inconclusive findings on diagnostic imaging of breast: Secondary | ICD-10-CM

## 2024-07-04 ENCOUNTER — Ambulatory Visit
Admission: EM | Admit: 2024-07-04 | Discharge: 2024-07-04 | Disposition: A | Discharge status: Home / Self Care | Payer: Self-pay | Attending: Emergency Medicine | Admitting: Emergency Medicine

## 2024-07-04 ENCOUNTER — Encounter: Payer: Self-pay | Admitting: Emergency Medicine

## 2024-07-04 ENCOUNTER — Ambulatory Visit: Payer: Self-pay

## 2024-07-04 DIAGNOSIS — R051 Acute cough: Secondary | ICD-10-CM

## 2024-07-04 DIAGNOSIS — J4521 Mild intermittent asthma with (acute) exacerbation: Secondary | ICD-10-CM

## 2024-07-04 LAB — POC COVID19/FLU A&B COMBO
Covid Antigen, POC: NEGATIVE
Influenza A Antigen, POC: NEGATIVE
Influenza B Antigen, POC: NEGATIVE

## 2024-07-04 MED ORDER — PREDNISONE 20 MG PO TABS
40.0000 mg | ORAL_TABLET | Freq: Every day | ORAL | 0 refills | Status: AC
Start: 2024-07-04 — End: 2024-07-09

## 2024-07-04 MED ORDER — ALBUTEROL SULFATE (2.5 MG/3ML) 0.083% IN NEBU
2.5000 mg | INHALATION_SOLUTION | Freq: Once | RESPIRATORY_TRACT | Status: AC
  Administered 2024-07-04: 2.5 mg via RESPIRATORY_TRACT

## 2024-07-04 MED ORDER — ALBUTEROL SULFATE HFA 108 (90 BASE) MCG/ACT IN AERS: 2.0000 | INHALATION_SPRAY | Freq: Four times a day (QID) | RESPIRATORY_TRACT | 1 refills | Status: AC | PRN

## 2024-07-04 MED ORDER — PROMETHAZINE-DM 6.25-15 MG/5ML PO SYRP: 5.0000 mL | ORAL_SOLUTION | Freq: Four times a day (QID) | ORAL | 0 refills | Status: AC | PRN

## 2024-07-04 NOTE — Discharge Instructions (Addendum)
 Please use the inhaler 3 times daily (every 6 hours) for the next several days. Then continue as needed.  Prednisone  40 mg daily for the next 5 days  The promethazine  DM cough syrup can be used up to 4 times daily. If this medication makes you drowsy, take only once before bed.  Please go to the emergency department if symptoms worsen.

## 2024-07-04 NOTE — ED Provider Notes (Signed)
 [facility]    CSN: [id number] Arrival date & time: 07/04/24  9046      History   Chief Complaint Chief Complaint  Patient presents with   URI    HPI Marissa Meyer is a 43 y.o. female.  1 week history of cough runny nose.  She has chest tightness with deep breath and coughing.  Feels wheezing.  Sometimes short of breath with exertion.  Cough is productive of green mucus.  Also having fatigue. 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   Positive UDS on 03/13/14; admitted for severe range BP and IUGR   COVID-19    Ectopic pregnancy    GBS (group B Streptococcus carrier), +RV culture, currently pregnant 03/08/2021   Gonorrhea    Hypertension    IUGR (intrauterine growth restriction) affecting care of mother 02/26/2021   Mental disorder    Migraines    but not diagnosed   Pregnancy induced hypertension    Preterm delivery    Severe pre-eclampsia, antepartum, third trimester 02/25/2021   Tobacco use in pregnancy, antepartum 02/20/2014   Trichimoniasis 12/24/2020   + 04/11 Neg 05/09 in MAU + in urine in MAU 05/18   Trichomonas     Patient Active Problem List   Diagnosis Date Noted   IUD (intrauterine device) in place 03/04/2021   History of trichomoniasis 12/21/2020   History of VBAC 12/21/2020   Prediabetes 12/21/2020   History of substance abuse (HCC) 10/09/2020   History of preterm labor 02/20/2014    Past Surgical History:  Procedure Laterality Date   CESAREAN SECTION N/A 03/21/2014   Procedure: CESAREAN SECTION;  Surgeon: Gloris DELENA Hugger, MD;  Location: [facility];  Service: Obstetrics;  Laterality: N/A;   CESAREAN SECTION  03/04/2021   Procedure: CESAREAN SECTION;  Surgeon:  Zina Jerilynn DELENA, MD;  Location: [facility];  Service: Obstetrics;;   DILATION AND CURETTAGE OF UTERUS     ECTOPIC PREGNANCY SURGERY      OB History     Gravida  6   Para  5   Term  0   Preterm  5   AB  1   Living  5      SAB      IAB      Ectopic  1   Multiple  0   Live Births  5            Home Medications    Prior to Admission medications   Medication Sig Start Date End Date Taking? Authorizing Provider  albuterol  (VENTOLIN  HFA) 108 (90 Base) MCG/ACT inhaler Inhale 2 puffs into the lungs every 6 (six) hours as needed for wheezing or shortness of breath. 07/04/24  Yes Kee Drudge, Asberry, PA-C  predniSONE  (DELTASONE ) 20 MG tablet Take 2 tablets (40 mg total) by mouth daily with breakfast for 5 days. 07/04/24 07/09/24 Yes Blimi Godby, Asberry, PA-C  promethazine -dextromethorphan (PROMETHAZINE -DM) 6.25-15 MG/5ML syrup Take 5 mLs by mouth 4 (four) times daily as needed for cough. 07/04/24  Yes Reyne Falconi, Asberry, PA-C  clindamycin  (CLEOCIN ) 300 MG capsule Take 1 capsule (300 mg total) by mouth 3 (three) times daily. 08/23/22   Lynwood Lenis, PA-C  clindamycin  (CLEOCIN ) 300 MG capsule Take 1 capsule (300 mg total) by mouth 3 (three) times daily. 07/04/23   Palumbo, April, MD  enalapril  (VASOTEC ) 5 MG tablet Take 1 tablet (5 mg total) by mouth daily. Patient not taking: Reported on 12/11/2021 03/06/21   Eveline Lynwood MATSU, MD  ibuprofen  (ADVIL ) 600 MG tablet Take 1 tablet (600 mg total) by mouth every 6 (six) hours. Patient not taking: Reported on 12/11/2021 03/06/21   Eveline Lynwood MATSU, MD  ibuprofen  (ADVIL ) 800 MG tablet Take 1 tablet (800 mg total) by mouth 3 (three) times daily. 08/23/22   Lynwood Lenis, PA-C  ibuprofen  (ADVIL ) 800 MG tablet Take 1 tablet (800 mg total) by mouth 3 (three) times daily. 07/04/23   Palumbo, April, MD  NIFEdipine  (ADALAT  CC) 60 MG 24 hr tablet Take 1 tablet (60 mg total) by mouth 2 (two) times daily. Patient not taking: Reported on 12/11/2021 03/06/21   Eveline Lynwood MATSU, MD  atorvastatin  (LIPITOR) 20 MG tablet Take 1 tablet (20 mg total) by mouth daily. Patient not taking: Reported on 08/17/2019 08/22/18 08/17/19  Arloa Suzen RAMAN, NP  hydrochlorothiazide  (HYDRODIURIL ) 25 MG tablet Take 1 tablet (25 mg total) by mouth daily. Patient not taking: Reported on 08/17/2019 04/11/19 08/17/19  Mesner, Selinda, MD    Family History Family History  Problem Relation Age of Onset   Hypertension Mother    Hypertension Maternal Grandmother    Cancer Maternal Grandfather    Hypertension Father     Social History Social History   Tobacco Use   Smoking status: Every Day    Current packs/day: 0.00    Types: Cigars, Cigarettes    Last attempt to quit: 08/13/2013    Years since quitting: 10.8    Passive exposure: Current   Smokeless tobacco: Never   Tobacco comments:    black and milds; 2-3 per day  Vaping Use   Vaping status: Never Used  Substance Use Topics   Alcohol use: No   Drug use: Not Currently    Frequency: 1.0 times per week    Types: Marijuana, Cocaine      Allergies   Amoxicillin   Review of Systems Review of Systems  As per HPI  Physical Exam Triage Vital Signs ED Triage Vitals  Encounter Vitals Group     BP 07/04/24 1036 (!) 152/107     Girls Systolic BP Percentile --      Girls Diastolic BP Percentile --      Boys Systolic BP Percentile --      Boys Diastolic BP Percentile --      Pulse Rate 07/04/24 1036 82     Resp 07/04/24 1036 18     Temp 07/04/24 1036 98.1 F (36.7 C)     Temp Source 07/04/24 1036 Oral     SpO2 07/04/24 1036 98 %     Weight 07/04/24 1035 109 lb (49.4 kg)     Height --      Head Circumference --      Peak Flow --      Pain Score 07/04/24 1034 5     Pain Loc --      Pain Education --      Exclude from Growth Chart --    No data found.  Updated Vital Signs BP (!) 152/107 (BP Location: Left Arm) Comment: Managed by meds - pt out of meds currently; No PCP  Pulse 82   Temp 98.1 F (36.7 C)  (Oral)  Resp 18   Wt 109 lb (49.4 kg)   LMP 07/03/2024 (Exact Date)   SpO2 98%   BMI 23.59 kg/m    Physical Exam Vitals and nursing note reviewed.  Constitutional:      General: She is not in acute distress.    Appearance: She is not ill-appearing or diaphoretic.  HENT:     Right Ear: Tympanic membrane and ear canal normal.     Left Ear: Tympanic membrane and ear canal normal.     Nose: Congestion present. No rhinorrhea.     Mouth/Throat:     Mouth: Mucous membranes are moist.     Pharynx: Oropharynx is clear. No posterior oropharyngeal erythema.  Eyes:     Conjunctiva/sclera: Conjunctivae normal.  Cardiovascular:     Rate and Rhythm: Normal rate and regular rhythm.     Pulses: Normal pulses.     Heart sounds: Normal heart sounds.  Pulmonary:     Effort: Pulmonary effort is normal. No respiratory distress.     Breath sounds: Normal breath sounds. No wheezing, rhonchi or rales.  Abdominal:     Palpations: Abdomen is soft.     Tenderness: There is no abdominal tenderness.  Musculoskeletal:     Cervical back: Normal range of motion. No rigidity.  Lymphadenopathy:     Cervical: No cervical adenopathy.  Skin:    General: Skin is warm and dry.  Neurological:     Mental Status: She is alert and oriented to person, place, and time.     UC Treatments / Results  Labs (all labs ordered are listed, but only abnormal results are displayed) Labs Reviewed  POC COVID19/FLU A&B COMBO - Normal    EKG   Radiology DG Chest 2 View Result Date: 07/04/2024 CLINICAL DATA:  Cough. EXAM: CHEST - 2 VIEW COMPARISON:  Chest radiograph dated 12/03/2019. FINDINGS: Faint peribronchial densities on the lateral view may represent reactive airway disease or viral infection. No pleural effusion or pneumothorax the cardiac silhouette is within normal limits. No acute osseous pathology. IMPRESSION: Findings may represent reactive airway disease or viral infection. Electronically Signed   By:  Vanetta Chou M.D.   On: 07/04/2024 13:12    Procedures Procedures (including critical care time)  Medications Ordered in UC Medications  albuterol  (PROVENTIL ) (2.5 MG/3ML) 0.083% nebulizer solution 2.5 mg (2.5 mg Nebulization Given 07/04/24 1305)    Initial Impression / Assessment and Plan / UC Course  I have reviewed the triage vital signs and the nursing notes.  Pertinent labs & imaging results that were available during my care of the patient were reviewed by me and considered in my medical decision making (see chart for details).  Afebrile, well-appearing, clear lungs on exam.  Even and unlabored breathing.  With chest tightness during deep breath, albuterol  nebulizer given. Patient notes improvement after treatment.  Chest x-ray reactive airway versus viral infection. Images independently reviewed by me, agree with radiology interpretation. Discussed with patient.  Albuterol  inhaler 3 times daily, prednisone  burst, Promethazine  DM with drowsy precautions.  Strict return and ED precautions are discussed, patient is agreeable with this plan.  A note for work is requested and provided    During triage patient had requested a COVID and flu test.  This was collected before provider evaluated.  Since she is 7 days into symptoms, I would not have recommended this testing.  Of note, tests were negative  Final Clinical Impressions(s) / UC Diagnoses   Final diagnoses:  Acute cough  Mild  intermittent reactive airway disease with acute exacerbation     Discharge Instructions      Please use the inhaler 3 times daily (every 6 hours) for the next several days. Then continue as needed.  Prednisone  40 mg daily for the next 5 days  The promethazine  DM cough syrup can be used up to 4 times daily. If this medication makes you drowsy, take only once before bed.  Please go to the emergency department if symptoms worsen.     ED Prescriptions     Medication Sig Dispense Auth.  Provider   albuterol  (VENTOLIN  HFA) 108 (90 Base) MCG/ACT inhaler Inhale 2 puffs into the lungs every 6 (six) hours as needed for wheezing or shortness of breath. 8 g Akiyah Eppolito, PA-C   promethazine -dextromethorphan (PROMETHAZINE -DM) 6.25-15 MG/5ML syrup Take 5 mLs by mouth 4 (four) times daily as needed for cough. 240 mL Nuri Larmer, PA-C   predniSONE  (DELTASONE ) 20 MG tablet Take 2 tablets (40 mg total) by mouth daily with breakfast for 5 days. 10 tablet Makinze Jani, Asberry, PA-C      PDMP not reviewed this encounter.   Jeryl Asberry, PA-C 07/04/24 1348

## 2024-07-04 NOTE — ED Triage Notes (Signed)
 Pt presents c/o URI x 7 days. Pt states,  I have chronic bronchitis but this feels different. I been coughing and my chest been hurting. Also, my nose been running, I keep sneezing, and my body feels tired. Can you see if I got the Covid or the flu.  Pt denies emesis but does c/o diarrhea at  start of sxs.
